# Patient Record
Sex: Male | Born: 1955 | Race: White | Hispanic: No | Marital: Married | Smoking: Former smoker
Health system: Southern US, Community
[De-identification: ages and names within clinical notes are randomized; demographics above are authoritative.]

## PROBLEM LIST (undated history)

## (undated) DIAGNOSIS — Z720 Tobacco use: Secondary | ICD-10-CM

## (undated) DIAGNOSIS — M1990 Unspecified osteoarthritis, unspecified site: Secondary | ICD-10-CM

## (undated) DIAGNOSIS — R42 Dizziness and giddiness: Secondary | ICD-10-CM

## (undated) DIAGNOSIS — I739 Peripheral vascular disease, unspecified: Secondary | ICD-10-CM

## (undated) DIAGNOSIS — E785 Hyperlipidemia, unspecified: Secondary | ICD-10-CM

## (undated) DIAGNOSIS — A6920 Lyme disease, unspecified: Secondary | ICD-10-CM

## (undated) DIAGNOSIS — I10 Essential (primary) hypertension: Secondary | ICD-10-CM

## (undated) DIAGNOSIS — I701 Atherosclerosis of renal artery: Secondary | ICD-10-CM

## (undated) DIAGNOSIS — Z8739 Personal history of other diseases of the musculoskeletal system and connective tissue: Secondary | ICD-10-CM

## (undated) DIAGNOSIS — I779 Disorder of arteries and arterioles, unspecified: Secondary | ICD-10-CM

## (undated) HISTORY — DX: Hyperlipidemia, unspecified: E78.5

## (undated) HISTORY — DX: Tobacco use: Z72.0

## (undated) HISTORY — DX: Peripheral vascular disease, unspecified: I73.9

## (undated) HISTORY — DX: Atherosclerosis of renal artery: I70.1

## (undated) HISTORY — DX: Disorder of arteries and arterioles, unspecified: I77.9

## (undated) HISTORY — DX: Dizziness and giddiness: R42

---

## 1978-10-23 HISTORY — PX: KNEE ARTHROSCOPY: SUR90

## 1988-10-22 HISTORY — PX: KNEE ARTHROSCOPY: SHX127

## 2001-05-07 ENCOUNTER — Ambulatory Visit (HOSPITAL_COMMUNITY): Admission: RE | Admit: 2001-05-07 | Discharge: 2001-05-07 | Payer: Self-pay | Admitting: Internal Medicine

## 2001-05-07 ENCOUNTER — Encounter: Payer: Self-pay | Admitting: Internal Medicine

## 2001-08-08 ENCOUNTER — Encounter: Payer: Self-pay | Admitting: Family Medicine

## 2001-08-08 ENCOUNTER — Ambulatory Visit (HOSPITAL_COMMUNITY): Admission: RE | Admit: 2001-08-08 | Discharge: 2001-08-08 | Payer: Self-pay | Admitting: Family Medicine

## 2001-08-14 ENCOUNTER — Ambulatory Visit (HOSPITAL_COMMUNITY): Admission: RE | Admit: 2001-08-14 | Discharge: 2001-08-14 | Payer: Self-pay | Admitting: Family Medicine

## 2001-08-14 ENCOUNTER — Encounter: Payer: Self-pay | Admitting: Family Medicine

## 2002-01-03 ENCOUNTER — Encounter: Payer: Self-pay | Admitting: Neurology

## 2002-01-03 ENCOUNTER — Ambulatory Visit (HOSPITAL_COMMUNITY): Admission: RE | Admit: 2002-01-03 | Discharge: 2002-01-03 | Payer: Self-pay | Admitting: Neurology

## 2002-01-28 ENCOUNTER — Ambulatory Visit (HOSPITAL_COMMUNITY): Admission: RE | Admit: 2002-01-28 | Discharge: 2002-01-28 | Payer: Self-pay | Admitting: Family Medicine

## 2002-01-28 ENCOUNTER — Encounter: Payer: Self-pay | Admitting: Family Medicine

## 2004-02-22 DIAGNOSIS — A6920 Lyme disease, unspecified: Secondary | ICD-10-CM

## 2004-02-22 HISTORY — DX: Lyme disease, unspecified: A69.20

## 2004-08-05 ENCOUNTER — Emergency Department (HOSPITAL_COMMUNITY): Admission: EM | Admit: 2004-08-05 | Discharge: 2004-08-05 | Payer: Self-pay | Admitting: *Deleted

## 2012-07-26 ENCOUNTER — Ambulatory Visit: Payer: Self-pay | Admitting: Internal Medicine

## 2012-07-26 ENCOUNTER — Other Ambulatory Visit (HOSPITAL_COMMUNITY): Payer: Self-pay | Admitting: Family Medicine

## 2012-07-26 DIAGNOSIS — R0989 Other specified symptoms and signs involving the circulatory and respiratory systems: Secondary | ICD-10-CM

## 2012-07-26 DIAGNOSIS — I739 Peripheral vascular disease, unspecified: Secondary | ICD-10-CM

## 2012-07-27 ENCOUNTER — Ambulatory Visit (HOSPITAL_COMMUNITY)
Admission: RE | Admit: 2012-07-27 | Discharge: 2012-07-27 | Disposition: A | Discharge status: Home / Self Care | Payer: Self-pay | Source: Ambulatory Visit | Attending: Family Medicine | Admitting: Family Medicine

## 2012-07-27 DIAGNOSIS — R0989 Other specified symptoms and signs involving the circulatory and respiratory systems: Secondary | ICD-10-CM

## 2012-07-27 DIAGNOSIS — I739 Peripheral vascular disease, unspecified: Secondary | ICD-10-CM | POA: Insufficient documentation

## 2012-07-27 DIAGNOSIS — I6529 Occlusion and stenosis of unspecified carotid artery: Secondary | ICD-10-CM | POA: Insufficient documentation

## 2012-08-01 ENCOUNTER — Ambulatory Visit (INDEPENDENT_AMBULATORY_CARE_PROVIDER_SITE_OTHER): Payer: Self-pay | Admitting: Cardiovascular Disease

## 2012-08-01 ENCOUNTER — Encounter: Payer: Self-pay | Admitting: Cardiovascular Disease

## 2012-08-01 ENCOUNTER — Encounter (HOSPITAL_COMMUNITY): Payer: Self-pay | Admitting: Cardiovascular Disease

## 2012-08-01 VITALS — BP 120/80 | HR 60 | Ht 70.0 in | Wt 155.0 lb

## 2012-08-01 DIAGNOSIS — R42 Dizziness and giddiness: Secondary | ICD-10-CM | POA: Insufficient documentation

## 2012-08-01 DIAGNOSIS — I779 Disorder of arteries and arterioles, unspecified: Secondary | ICD-10-CM | POA: Insufficient documentation

## 2012-08-01 DIAGNOSIS — I739 Peripheral vascular disease, unspecified: Secondary | ICD-10-CM

## 2012-08-01 NOTE — Patient Instructions (Addendum)
  We will see you back in follow up AFTER THE TEST  Dr Allyson Sabal has ordered LOWER EXTREMITY DOPPLER IN THE NEXT 1-2 WEEKS AND CAROTID DOPPLERS TO BE DONE EVERY 6 MONTHS

## 2012-08-01 NOTE — Assessment & Plan Note (Signed)
He gives a classic history of right calf claudication. His femoral pulses are full without bruit and he has 2+ pedal pulses. Will get bilateral lower extremity arterial Dopplers in our office for further evaluation.

## 2012-08-01 NOTE — Progress Notes (Signed)
   08/01/2012 George Cooper   07-20-1955  [id number]  Primary Physician Kirk Ruths, MD Primary Cardiologist: Runell Gess MD Roseanne Reno   HPI:  George Cooper is a very pleasant 57 year old thin appearing married Caucasian male father of 2, grandfather of 2 grandchildren works as an Personnel officer. He was referred by George Cooper , at [facility], in [place] for evaluation of dizziness and carotid artery disease. The symptoms began several weeks ago. They're more positional. [facility] which revealed mild to moderate bilateral internal carotid artery stenosis. He is otherwise neurologically asymptomatic. He denies chest pain or shortness of breath. He does complain however of right calf claudication. His crit was profile is positive for a 20-pack-year history of tobacco abuse currently smoking one half pack per day. He is not diabetic, hypertensive or hyperlipidemic. His mother did have bypass surgery in her 97s and his sister has had coronary stents as well..   Current Outpatient Prescriptions  Medication Sig Dispense Refill  . aspirin 81 MG tablet Take 81 mg by mouth daily.      . meclizine (ANTIVERT) 25 MG tablet Take 25 mg by mouth 2 (two) times daily.       No current facility-administered medications for this visit.    No Known Allergies  History   Social History  . Marital Status: Married    Spouse Name: N/A    Number of Children: N/A  . Years of Education: N/A   Occupational History  . Not on file.   Social History Main Topics  . Smoking status: Current Every Day Smoker -- 0.50 packs/day    Types: Cigarettes  . Smokeless tobacco: Not on file  . Alcohol Use: No  . Drug Use: Not on file  . Sexually Active: Not on file   Other Topics Concern  . Not on file   Social History Narrative  . No narrative on file     Review of Systems: General: negative for chills, fever, night sweats or weight  changes.  Cardiovascular: negative for chest pain, dyspnea on exertion, edema, orthopnea, palpitations, paroxysmal nocturnal dyspnea or shortness of breath Dermatological: negative for rash Respiratory: negative for cough or wheezing Urologic: negative for hematuria Abdominal: negative for nausea, vomiting, diarrhea, bright red blood per rectum, melena, or hematemesis Neurologic: negative for visual changes, syncope, or dizziness All other systems reviewed and are otherwise negative except as noted above.    Blood pressure 120/80, pulse 60, height 5\' 10"  (1.778 m), weight 155 lb (70.308 kg).  General appearance: alert Neck: no adenopathy, no JVD, supple, symmetrical, trachea midline, thyroid not enlarged, symmetric, no tenderness/mass/nodules and soft left carotid bruit Lungs: clear to auscultation bilaterally Heart: regular rate and rhythm, S1, S2 normal, no murmur, click, rub or gallop Abdomen: soft, non-tender; bowel sounds normal; no masses,  no organomegaly Extremities: extremities normal, atraumatic, no cyanosis or edema Pulses: 2+ and symmetric  EKG sinus rhythm at 60 with inferior Q waves extending up the lateral leads  ASSESSMENT AND PLAN:   Claudication He gives a classic history of right calf claudication. His femoral pulses are full without bruit and he has 2+ pedal pulses. Will get bilateral lower extremity arterial Dopplers in our office for further evaluation.      Runell Gess MD FACP,FACC,FAHA, [facility] 08/01/2012 10:56 AM

## 2012-08-07 ENCOUNTER — Ambulatory Visit: Payer: Self-pay | Admitting: Internal Medicine

## 2012-08-16 ENCOUNTER — Encounter (HOSPITAL_COMMUNITY): Payer: Self-pay

## 2012-08-16 ENCOUNTER — Ambulatory Visit (HOSPITAL_COMMUNITY)
Admission: RE | Admit: 2012-08-16 | Discharge: 2012-08-16 | Disposition: A | Discharge status: Home / Self Care | Payer: Self-pay | Source: Ambulatory Visit | Attending: Internal Medicine | Admitting: Internal Medicine

## 2012-08-16 DIAGNOSIS — I70219 Atherosclerosis of native arteries of extremities with intermittent claudication, unspecified extremity: Secondary | ICD-10-CM | POA: Insufficient documentation

## 2012-08-16 DIAGNOSIS — I739 Peripheral vascular disease, unspecified: Secondary | ICD-10-CM

## 2012-08-16 NOTE — Progress Notes (Signed)
Lower Extremity Arterial Duplex Completed. °George Cooper ° °

## 2012-09-05 ENCOUNTER — Encounter: Payer: Self-pay | Admitting: Cardiovascular Disease

## 2012-09-05 ENCOUNTER — Ambulatory Visit (INDEPENDENT_AMBULATORY_CARE_PROVIDER_SITE_OTHER): Payer: Self-pay | Admitting: Cardiovascular Disease

## 2012-09-05 ENCOUNTER — Encounter (HOSPITAL_COMMUNITY): Payer: Self-pay | Admitting: Pharmacy Technician

## 2012-09-05 VITALS — BP 142/74 | HR 56 | Ht 69.0 in | Wt 154.0 lb

## 2012-09-05 DIAGNOSIS — F17200 Nicotine dependence, unspecified, uncomplicated: Secondary | ICD-10-CM

## 2012-09-05 DIAGNOSIS — D689 Coagulation defect, unspecified: Secondary | ICD-10-CM

## 2012-09-05 DIAGNOSIS — I739 Peripheral vascular disease, unspecified: Secondary | ICD-10-CM

## 2012-09-05 DIAGNOSIS — Z720 Tobacco use: Secondary | ICD-10-CM

## 2012-09-05 DIAGNOSIS — Z01818 Encounter for other preprocedural examination: Secondary | ICD-10-CM

## 2012-09-05 DIAGNOSIS — R5383 Other fatigue: Secondary | ICD-10-CM

## 2012-09-05 DIAGNOSIS — R5381 Other malaise: Secondary | ICD-10-CM

## 2012-09-05 NOTE — Progress Notes (Signed)
Mr. Reinig returns for followup. His Dopplers are abnormal suggesting a right SFA stenosis. He is admitted for angiography and potential endovascular therapy for lifestyle limiting claudication.  Runell Gess, M.D., [facility] [address] [place], [place]  [address]  [phone] 09/05/2012 11:58 AM

## 2012-09-05 NOTE — Patient Instructions (Addendum)
Dr. Allyson Sabal has ordered a peripheral angiogram to be done at [facility].  This procedure is going to look at the bloodflow in your lower extremities.  If Dr. Allyson Sabal is able to open up the arteries, you will have to spend one night in the hospital.  If he is not able to open the arteries, you will be able to go home that same day.    After the procedure, you will not be allowed to drive for 3 days or push, pull, or lift anything greater than 10 lbs for one week.    You will be required to have bloodwork (fasting) and a chest xray prior to your procedure.  Our scheduler will advise you on when these items need to be done.     REPS: Scott   Dr Allyson Sabal also recommends that you have a stress test prior to the angiogram to look at the bloodflow to your heart muscle

## 2012-09-05 NOTE — Assessment & Plan Note (Signed)
Lower extremity arterial Dopplers performed 08/15/12 revealed a right ABI of 0.79 the high-frequency signal the origin of his right SFA and a left ABI of 0.8 with moderate left iliac disease.I recommended that he undergo abdominal aortography with bifemoral runoff and potential endovascular therapy of his right SFA stenosis for lifestyle limiting claudication.

## 2012-09-13 ENCOUNTER — Ambulatory Visit
Admission: RE | Admit: 2012-09-13 | Discharge: 2012-09-13 | Disposition: A | Discharge status: Home / Self Care | Payer: No Typology Code available for payment source | Source: Ambulatory Visit | Attending: Cardiovascular Disease | Admitting: Cardiovascular Disease

## 2012-09-13 DIAGNOSIS — Z720 Tobacco use: Secondary | ICD-10-CM

## 2012-09-14 ENCOUNTER — Encounter: Payer: Self-pay | Admitting: Internal Medicine

## 2012-09-14 ENCOUNTER — Ambulatory Visit (HOSPITAL_COMMUNITY)
Admission: RE | Admit: 2012-09-14 | Discharge: 2012-09-14 | Disposition: A | Discharge status: Home / Self Care | Payer: Self-pay | Source: Ambulatory Visit | Attending: Cardiovascular Disease | Admitting: Cardiovascular Disease

## 2012-09-14 DIAGNOSIS — Z01818 Encounter for other preprocedural examination: Secondary | ICD-10-CM

## 2012-09-14 DIAGNOSIS — Z8249 Family history of ischemic heart disease and other diseases of the circulatory system: Secondary | ICD-10-CM | POA: Insufficient documentation

## 2012-09-14 DIAGNOSIS — R5383 Other fatigue: Secondary | ICD-10-CM | POA: Insufficient documentation

## 2012-09-14 DIAGNOSIS — Z01810 Encounter for preprocedural cardiovascular examination: Secondary | ICD-10-CM

## 2012-09-14 DIAGNOSIS — F17200 Nicotine dependence, unspecified, uncomplicated: Secondary | ICD-10-CM | POA: Insufficient documentation

## 2012-09-14 DIAGNOSIS — R5381 Other malaise: Secondary | ICD-10-CM | POA: Insufficient documentation

## 2012-09-14 DIAGNOSIS — R42 Dizziness and giddiness: Secondary | ICD-10-CM | POA: Insufficient documentation

## 2012-09-14 DIAGNOSIS — I739 Peripheral vascular disease, unspecified: Secondary | ICD-10-CM

## 2012-09-14 DIAGNOSIS — I6529 Occlusion and stenosis of unspecified carotid artery: Secondary | ICD-10-CM | POA: Insufficient documentation

## 2012-09-14 LAB — COMPREHENSIVE METABOLIC PANEL
ALT: 13 U/L (ref 0–53)
Albumin: 4 g/dL (ref 3.5–5.2)
BUN: 18 mg/dL (ref 6–23)
CO2: 23 mEq/L (ref 19–32)
Calcium: 9.1 mg/dL (ref 8.4–10.5)
Chloride: 106 mEq/L (ref 96–112)
Creat: 1.04 mg/dL (ref 0.50–1.35)

## 2012-09-14 LAB — CBC
HCT: 43.2 % (ref 39.0–52.0)
MCV: 84.5 fL (ref 78.0–100.0)
RBC: 5.11 MIL/uL (ref 4.22–5.81)
WBC: 9.8 10*3/uL (ref 4.0–10.5)

## 2012-09-14 LAB — PROTIME-INR: INR: 0.96 (ref <1.50)

## 2012-09-14 LAB — LIPID PANEL: HDL: 29 mg/dL — ABNORMAL LOW (ref >39)

## 2012-09-14 MED ORDER — TECHNETIUM TC 99M SESTAMIBI GENERIC - CARDIOLITE
31.0000 | Freq: Once | INTRAVENOUS | Status: AC | PRN
  Administered 2012-09-14: 31 via INTRAVENOUS

## 2012-09-14 MED ORDER — REGADENOSON 0.4 MG/5ML IV SOLN
0.4000 mg | Freq: Once | INTRAVENOUS | Status: AC
  Administered 2012-09-14: 0.4 mg via INTRAVENOUS

## 2012-09-14 MED ORDER — TECHNETIUM TC 99M SESTAMIBI GENERIC - CARDIOLITE
11.0000 | Freq: Once | INTRAVENOUS | Status: AC | PRN
  Administered 2012-09-14: 11 via INTRAVENOUS

## 2012-09-14 NOTE — Procedures (Addendum)
[facility] [address] [place] [address] [place] [address] [phone]  Cardiology Nuclear Med Study  George Cooper is a 57 y.o. male     MRN : [id number]     DOB: 1955-12-17  Procedure Date: 09/14/2012  Nuclear Med Background Indication for Stress Test:  Surgical Clearance History:  NO PRIOR HISTORY REPORTED Cardiac Risk Factors: Carotid Disease, Claudication, Family History - CAD, PVD and Smoker  Symptoms:  Dizziness, Fatigue and Light-Headedness   Nuclear Pre-Procedure Caffeine/Decaff Intake:  7:00pm NPO After: 5:00am   IV Site: R Hand  IV 0.9% NS with Angio Cath:  22g  Chest Size (in):  42"  IV Started by: Emmit Pomfret, RN  Height: 5\' 9"  (1.753 m)  Cup Size: n/a  BMI:  Body mass index is 22.73 kg/(m^2). Weight:  154 lb (69.854 kg)   Tech Comments:  N/A    Nuclear Med Study 1 or 2 day study: 1 day  Stress Test Type:  Lexiscan  Order Authorizing Provider:  Nanetta Batty, MD   Resting Radionuclide: Technetium 20m Sestamibi  Resting Radionuclide Dose: 11.0 mCi   Stress Radionuclide:  Technetium 58m Sestamibi  Stress Radionuclide Dose: 31.0 mCi           Stress Protocol Rest HR: 53 Stress HR: 96  Rest BP: 142/76 Stress BP: 133/62  Exercise Time (min): n/a METS: n/a   Predicted Max HR: 162 bpm % Max HR: 59.26 bpm Rate Pressure Product: 21308  Dose of Adenosine (mg):  n/a Dose of Lexiscan: 0.4 mg  Dose of Atropine (mg): n/a Dose of Dobutamine: n/a mcg/kg/min (at max HR)  Stress Test Technologist: Esperanza Sheets, CCT Nuclear Technologist: Gonzella Lex, CNMT   Rest Procedure:  Myocardial perfusion imaging was performed at rest 45 minutes following the intravenous administration of Technetium 40m Sestamibi. Stress Procedure:  The patient received IV Lexiscan 0.4 mg over 15-seconds.  Technetium 37m Sestamibi injected at 30-seconds.  There were no significant changes with Lexiscan.  Quantitative spect images were obtained  after a 45 minute delay.  Transient Ischemic Dilatation (Normal <1.22):  1.0 Lung/Heart Ratio (Normal <0.45):  0.29 QGS EDV:  91 ml QGS ESV:  36 ml LV Ejection Fraction: 60%     Rest ECG: Sinus bradycardia. Nonspecific inferolateral Q waves  Stress ECG: No significant change from baseline ECG  QPS Raw Data Images:  Normal; no motion artifact; normal heart/lung ratio. Stress Images:  Moderate reduction in inferoseptal tracer uptake. Rest Images:  Comparison with the stress images reveals no significant change. Subtraction (SDS):  There is a fixed defect that is most consistent with a previous infarction.  Impression Exercise Capacity:  Lexiscan with no exercise. BP Response:  Normal blood pressure response. Clinical Symptoms:  No significant symptoms noted. ECG Impression:  No significant ST segment change suggestive of ischemia. Comparison with Prior Nuclear Study: No previous nuclear study performed  Overall Impression:  Low risk stress nuclear study with evidence of an inferoseptal scar. There is no evidence of reversible ischemia.  LV Wall Motion:  Normal overall LV function. Regional wall motion is difficult to evaluate due to technical/processing limitations.   [name], MD  09/14/2012 2:37 PM

## 2012-09-17 ENCOUNTER — Telehealth: Payer: Self-pay | Admitting: *Deleted

## 2012-09-17 DIAGNOSIS — R9389 Abnormal findings on diagnostic imaging of other specified body structures: Secondary | ICD-10-CM

## 2012-09-17 NOTE — Telephone Encounter (Signed)
Message copied by Marella Bile on Mon Sep 17, 2012  5:13 PM ------      Message from: Runell Gess      Created: Thu Sep 13, 2012  1:38 PM       Repeat chest x-ray with nipple markers ------

## 2012-09-18 ENCOUNTER — Ambulatory Visit (HOSPITAL_COMMUNITY)
Admission: RE | Admit: 2012-09-18 | Discharge: 2012-09-19 | Disposition: A | Discharge status: Home / Self Care | Payer: MEDICAID | Source: Ambulatory Visit | Attending: Cardiovascular Disease | Admitting: Cardiovascular Disease

## 2012-09-18 ENCOUNTER — Encounter (HOSPITAL_COMMUNITY)
Admission: RE | Disposition: A | Discharge status: Home / Self Care | Payer: Self-pay | Source: Ambulatory Visit | Attending: Cardiovascular Disease

## 2012-09-18 ENCOUNTER — Encounter (HOSPITAL_COMMUNITY): Payer: Self-pay | Admitting: General Practice

## 2012-09-18 ENCOUNTER — Ambulatory Visit (HOSPITAL_COMMUNITY): Payer: Self-pay

## 2012-09-18 DIAGNOSIS — Z7902 Long term (current) use of antithrombotics/antiplatelets: Secondary | ICD-10-CM | POA: Insufficient documentation

## 2012-09-18 DIAGNOSIS — I6529 Occlusion and stenosis of unspecified carotid artery: Secondary | ICD-10-CM | POA: Insufficient documentation

## 2012-09-18 DIAGNOSIS — I658 Occlusion and stenosis of other precerebral arteries: Secondary | ICD-10-CM | POA: Insufficient documentation

## 2012-09-18 DIAGNOSIS — Z79899 Other long term (current) drug therapy: Secondary | ICD-10-CM | POA: Insufficient documentation

## 2012-09-18 DIAGNOSIS — F17200 Nicotine dependence, unspecified, uncomplicated: Secondary | ICD-10-CM | POA: Insufficient documentation

## 2012-09-18 DIAGNOSIS — I701 Atherosclerosis of renal artery: Secondary | ICD-10-CM | POA: Insufficient documentation

## 2012-09-18 DIAGNOSIS — I739 Peripheral vascular disease, unspecified: Secondary | ICD-10-CM | POA: Diagnosis present

## 2012-09-18 DIAGNOSIS — Z01818 Encounter for other preprocedural examination: Secondary | ICD-10-CM

## 2012-09-18 DIAGNOSIS — I70219 Atherosclerosis of native arteries of extremities with intermittent claudication, unspecified extremity: Secondary | ICD-10-CM

## 2012-09-18 DIAGNOSIS — I708 Atherosclerosis of other arteries: Secondary | ICD-10-CM | POA: Insufficient documentation

## 2012-09-18 DIAGNOSIS — I779 Disorder of arteries and arterioles, unspecified: Secondary | ICD-10-CM

## 2012-09-18 HISTORY — PX: ATHERECTOMY: SHX5502

## 2012-09-18 HISTORY — DX: Lyme disease, unspecified: A69.20

## 2012-09-18 HISTORY — PX: LOWER EXTREMITY ANGIOGRAM: SHX5508

## 2012-09-18 HISTORY — PX: PERIPHERAL ARTERIAL STENT GRAFT: SHX2220

## 2012-09-18 HISTORY — DX: Unspecified osteoarthritis, unspecified site: M19.90

## 2012-09-18 HISTORY — PX: PERCUTANEOUS STENT INTERVENTION: SHX5500

## 2012-09-18 HISTORY — DX: Personal history of other diseases of the musculoskeletal system and connective tissue: Z87.39

## 2012-09-18 HISTORY — PX: ABDOMINAL ANGIOGRAM: SHX5499

## 2012-09-18 LAB — POCT ACTIVATED CLOTTING TIME: Activated Clotting Time: 201 seconds

## 2012-09-18 SURGERY — ANGIOGRAM, LOWER EXTREMITY
Anesthesia: LOCAL | Laterality: Right

## 2012-09-18 MED ORDER — SODIUM CHLORIDE 0.9 % IV SOLN
INTRAVENOUS | Status: AC
  Administered 2012-09-18: 13:00:00 via INTRAVENOUS

## 2012-09-18 MED ORDER — FENTANYL CITRATE 0.05 MG/ML IJ SOLN
INTRAMUSCULAR | Status: AC
  Filled 2012-09-18: qty 2

## 2012-09-18 MED ORDER — CLOPIDOGREL BISULFATE 300 MG PO TABS
ORAL_TABLET | ORAL | Status: AC
  Filled 2012-09-18: qty 1

## 2012-09-18 MED ORDER — SODIUM CHLORIDE 0.9 % IV SOLN
INTRAVENOUS | Status: DC
  Administered 2012-09-18: 08:00:00 via INTRAVENOUS

## 2012-09-18 MED ORDER — ASPIRIN EC 325 MG PO TBEC
325.0000 mg | DELAYED_RELEASE_TABLET | Freq: Every day | ORAL | Status: DC
  Administered 2012-09-19: 325 mg via ORAL
  Filled 2012-09-18: qty 1

## 2012-09-18 MED ORDER — HEPARIN (PORCINE) IN NACL 2-0.9 UNIT/ML-% IJ SOLN
INTRAMUSCULAR | Status: AC
  Filled 2012-09-18: qty 500

## 2012-09-18 MED ORDER — ACETAMINOPHEN 325 MG PO TABS: 650.0000 mg | ORAL_TABLET | ORAL | Status: DC | PRN

## 2012-09-18 MED ORDER — LIDOCAINE HCL (PF) 1 % IJ SOLN
INTRAMUSCULAR | Status: AC
  Filled 2012-09-18: qty 30

## 2012-09-18 MED ORDER — IBUPROFEN 800 MG PO TABS
800.0000 mg | ORAL_TABLET | ORAL | Status: DC | PRN
  Administered 2012-09-18: 800 mg via ORAL
  Filled 2012-09-18: qty 1

## 2012-09-18 MED ORDER — ONDANSETRON HCL 4 MG/2ML IJ SOLN: 4.0000 mg | Freq: Four times a day (QID) | INTRAMUSCULAR | Status: DC | PRN

## 2012-09-18 MED ORDER — ASPIRIN 81 MG PO CHEW
324.0000 mg | CHEWABLE_TABLET | ORAL | Status: AC
  Administered 2012-09-18: 324 mg via ORAL
  Filled 2012-09-18: qty 4

## 2012-09-18 MED ORDER — ASPIRIN EC 81 MG PO TBEC: 81.0000 mg | DELAYED_RELEASE_TABLET | Freq: Every day | ORAL | Status: DC

## 2012-09-18 MED ORDER — MECLIZINE HCL 25 MG PO TABS
25.0000 mg | ORAL_TABLET | Freq: Two times a day (BID) | ORAL | Status: DC | PRN
  Filled 2012-09-18: qty 1

## 2012-09-18 MED ORDER — HEPARIN (PORCINE) IN NACL 2-0.9 UNIT/ML-% IJ SOLN
INTRAMUSCULAR | Status: AC
  Filled 2012-09-18: qty 1000

## 2012-09-18 MED ORDER — MIDAZOLAM HCL 2 MG/2ML IJ SOLN
INTRAMUSCULAR | Status: AC
  Filled 2012-09-18: qty 2

## 2012-09-18 MED ORDER — MORPHINE SULFATE 2 MG/ML IJ SOLN
2.0000 mg | Freq: Once | INTRAMUSCULAR | Status: AC
  Administered 2012-09-18: 14:00:00 4 mg via INTRAVENOUS
  Filled 2012-09-18: qty 2

## 2012-09-18 MED ORDER — SODIUM CHLORIDE 0.9 % IJ SOLN: 3.0000 mL | INTRAMUSCULAR | Status: DC | PRN

## 2012-09-18 MED ORDER — CLOPIDOGREL BISULFATE 75 MG PO TABS
75.0000 mg | ORAL_TABLET | Freq: Every day | ORAL | Status: DC
  Administered 2012-09-19: 75 mg via ORAL
  Filled 2012-09-18: qty 1

## 2012-09-18 MED ORDER — DIAZEPAM 5 MG PO TABS
5.0000 mg | ORAL_TABLET | ORAL | Status: AC
  Administered 2012-09-18: 5 mg via ORAL
  Filled 2012-09-18: qty 1

## 2012-09-18 NOTE — Progress Notes (Signed)
Site area: left groin  Site Prior to Removal:  Level 0  Pressure Applied For 30    MINUTES    Minutes Beginning at 1415  Manual:   yes  Patient Status During Pull:  stable  Post Pull Groin Site:  Level 0  Post Pull Instructions Given:  yes  Post Pull Pulses Present:  yes  Dressing Applied:  yes  Comments:  Held extra 10 minutes secondary to ooze at site, which was present upon arrival to unit.

## 2012-09-18 NOTE — CV Procedure (Signed)
George Cooper is a 57 y.o. male    [id number] LOCATION:  FACILITY: [facility]  PHYSICIAN: Nanetta Batty, M.D. 11/02/1955   DATE OF PROCEDURE:  09/18/2012  DATE OF DISCHARGE:   CARDIAC CATHETERIZATION     History obtained from chart review.George Cooper is a very pleasant 57 year old thin appearing married Caucasian male father of 2, grandfather of 2 grandchildren works as an Personnel officer. He was referred by Dr. Yetta Numbers , at [facility], in [place] for evaluation of dizziness and carotid artery disease. The symptoms began several weeks ago. They're more positional. He [facility] which revealed mild to moderate bilateral internal carotid artery stenosis. He is otherwise neurologically asymptomatic. He denies chest pain or shortness of breath. He does complain however of right calf claudication. His crit was profile is positive for a 20-pack-year history of tobacco abuse currently smoking one half pack per day. He is not diabetic, hypertensive or hyperlipidemic. His mother did have bypass surgery in her 65s and his sister has had coronary stents as well. Stress test which was normal. Lower extremity arterial Dopplers revealed a high-frequency signal in the proximal segment of his right SFA. He presents today for angiography and potential percutaneous intervention for lifestyle limiting claudication    PROCEDURE DESCRIPTION:    The patient was brought to the second floor [facility] cath lab in the postabsorptive state. He was premedicated with Valium 5 mg by mouth, IV Versed and fentanyl. His left groinwas prepped and shaved in usual sterile fashion. Xylocaine 1% was used for local anesthesia. A 5 French sheath was inserted into the left common femoral artery using standard Seldinger technique. A 5 French pigtail catheter was used for midstream abdominal aortography, bilateral iliac angiography, and bifemoral runoff using bolus chase digital  subtraction step table technique. Visipaque dye was used for the entirety of the case. Retrograde aortic pressure was monitored during the case.   HEMODYNAMICS:    AO SYSTOLIC/AO DIASTOLIC: 145/68  ANGIOGRAPHIC RESULTS:   1: Abdominal aortogram-80% proximal right renal artery stenosis, 40% left  2: Left lower extremity-40-50% ostial left common iliac artery stenosis with three-vessel runoff  3: Right lower extremity-tandem 80% stenoses in the proximal third of the right SFA with three-vessel runoff  IMPRESSION:George Cooper has tandem high-grade proximal right SFA stenoses responsible for his claudication. We will proceed with provocative directional atherectomy, plus/minus PTA and stenting.  Procedure description:contralateral access obtained with a 5 [place] crossover catheter and a 7 French/55 cm Ansel sheath. The patient received 7000 units of heparin intravenously and an ACT of 237. Total contrast the minister to the patient with 258 cc. The lesion was crossed with an 014/300 cm long [place] core wire. A 6 mm spider distal protection device was then advanced into the above-knee popliteal segment. Using LXC turbo hawked calcium cutter, multiple atherectomy runs were performed of the diseased segment. A large amount of plaque was removed. There did appear to be a distal dissection and angioplasty was then performed with a 5 mm x 120 mm long chocolate balloon for 2 minutes at 2 atmospheres. There remained a small linear area of dissection in the midportion as well as a hypodense area in the mid midportion as well and therefore stenting was performed with a 6 mm x 120 mm long S.M.A.R.T. Flex Nitinol self expanding stent. The final angiographic result with reduction of tandem 80% stenoses to 0% residual with excellent flow and three-vessel runoff. The distal protection device was then captured  and withdrawn from the body. Completion angiography was performed revealing three-vessel runoff with an excellent  angiographic result. The sheath was then withdrawn across the bifurcation and exchanged over a wire for a short 7 [place] sheath. The patient received 300 mg of by mouth Plavix.  Final impression: Successful turbo Hawk directional atherectomy, PTA and stenting of high-grade tandem proximal right SFA stenoses for lifestyle limiting claudication. The patient will be treated with aspirin and Plavix. He will be hydrated overnight and discharged home in the morning. He will get followup Dopplers after which I will see him back in the office.  Runell Gess MD, [facility] 09/18/2012 10:54 AM

## 2012-09-18 NOTE — H&P (Signed)
    Pt was reexamined and existing H & P reviewed. No changes found.  Runell Gess, MD [facility] 09/18/2012 9:21 AM

## 2012-09-19 ENCOUNTER — Other Ambulatory Visit: Payer: Self-pay | Admitting: Physician Assistant

## 2012-09-19 DIAGNOSIS — I739 Peripheral vascular disease, unspecified: Secondary | ICD-10-CM

## 2012-09-19 LAB — CBC
Hemoglobin: 13.8 g/dL (ref 13.0–17.0)
MCHC: 34.1 g/dL (ref 30.0–36.0)
Platelets: 186 10*3/uL (ref 150–400)
RBC: 4.72 MIL/uL (ref 4.22–5.81)

## 2012-09-19 LAB — BASIC METABOLIC PANEL
Calcium: 9.2 mg/dL (ref 8.4–10.5)
GFR calc Af Amer: 87 mL/min — ABNORMAL LOW (ref >90)
GFR calc non Af Amer: 75 mL/min — ABNORMAL LOW (ref >90)
Potassium: 3.9 mEq/L (ref 3.5–5.1)
Sodium: 136 mEq/L (ref 135–145)

## 2012-09-19 LAB — POCT ACTIVATED CLOTTING TIME
Activated Clotting Time: 232 seconds
Activated Clotting Time: 237 seconds

## 2012-09-19 MED ORDER — CLOPIDOGREL BISULFATE 75 MG PO TABS: 75.0000 mg | ORAL_TABLET | Freq: Every day | ORAL | Status: DC

## 2012-09-19 MED ORDER — ASPIRIN 325 MG PO TBEC: 325.0000 mg | DELAYED_RELEASE_TABLET | Freq: Every day | ORAL | Status: DC

## 2012-09-19 NOTE — Progress Notes (Signed)
Subjective: No complaints  Objective: Vital signs in last 24 hours: Temp:  [97.5 F (36.4 C)-98.7 F (37.1 C)] 98 F (36.7 C) (07/30 0800) Pulse Rate:  [54-80] 60 (07/30 0800) Resp:  [15-20] 18 (07/30 0800) BP: (126-170)/(49-106) 136/68 mmHg (07/30 0800) SpO2:  [96 %-99 %] 98 % (07/30 0800) Weight:  [154 lb 15.7 oz (70.3 kg)] 154 lb 15.7 oz (70.3 kg) (07/30 0052) Last BM Date: 09/18/12  Intake/Output from previous day: 07/29 0701 - 07/30 0700 In: 1303.8 [P.O.:720; I.V.:583.8] Out: 500 [Urine:500] Intake/Output this shift:    Medications Current Facility-Administered Medications  Medication Dose Route Frequency Provider Last Rate Last Dose  . acetaminophen (TYLENOL) tablet 650 mg  650 mg Oral Q4H PRN Runell Gess, MD      . aspirin EC tablet 325 mg  325 mg Oral Daily Runell Gess, MD   325 mg at 09/19/12 1478  . clopidogrel (PLAVIX) tablet 75 mg  75 mg Oral Q breakfast Runell Gess, MD   75 mg at 09/19/12 2956  . ibuprofen (ADVIL,MOTRIN) tablet 800 mg  800 mg Oral Q4H PRN Lennette Bihari, MD   800 mg at 09/18/12 2130  . meclizine (ANTIVERT) tablet 25 mg  25 mg Oral BID PRN Runell Gess, MD      . ondansetron [facility]) injection 4 mg  4 mg Intravenous Q6H PRN Runell Gess, MD        PE: General appearance: alert, cooperative and no distress Lungs: clear to auscultation bilaterally Heart: regular rate and rhythm, S1, S2 normal, no murmur, click, rub or gallop Extremities: No LEE Pulses: 2+ and symmetric 2+ left PT, 2+ right PT and 1+ right DP Skin: Warm and dry Neurologic: Grossly normal  Lab Results:   Recent Labs  09/19/12 0531  WBC 9.3  HGB 13.8  HCT 40.5  PLT 186   BMET  Recent Labs  09/19/12 0531  NA 136  K 3.9  CL 102  CO2 24  GLUCOSE 101*  BUN 14  CREATININE 1.07  CALCIUM 9.2   Studies/Results: HEMODYNAMICS:  AO SYSTOLIC/AO DIASTOLIC: 145/68  ANGIOGRAPHIC RESULTS:  1: Abdominal aortogram-80% proximal right renal  artery stenosis, 40% left  2: Left lower extremity-40-50% ostial left common iliac artery stenosis with three-vessel runoff  3: Right lower extremity-tandem 80% stenoses in the proximal third of the right SFA with three-vessel runoff  IMPRESSION:[name] has tandem high-grade proximal right SFA stenoses responsible for his claudication. We will proceed with provocative directional atherectomy, plus/minus PTA and stenting.  Procedure description:contralateral access obtained with a 5 [place] crossover catheter and a 7 French/55 cm Ansel sheath. The patient received 7000 units of heparin intravenously and an ACT of 237. Total contrast the minister to the patient with 258 cc. The lesion was crossed with an 014/300 cm long [place] core wire. A 6 mm spider distal protection device was then advanced into the above-knee popliteal segment. Using LXC turbo hawked calcium cutter, multiple atherectomy runs were performed of the diseased segment. A large amount of plaque was removed. There did appear to be a distal dissection and angioplasty was then performed with a 5 mm x 120 mm long chocolate balloon for 2 minutes at 2 atmospheres. There remained a small linear area of dissection in the midportion as well as a hypodense area in the mid midportion as well and therefore stenting was performed with a 6 mm x 120 mm long S.M.A.R.T. Flex Nitinol self expanding stent. The final  angiographic result with reduction of tandem 80% stenoses to 0% residual with excellent flow and three-vessel runoff. The distal protection device was then captured and withdrawn from the body. Completion angiography was performed revealing three-vessel runoff with an excellent angiographic result. The sheath was then withdrawn across the bifurcation and exchanged over a wire for a short 7 [place] sheath. The patient received 300 mg of by mouth Plavix.  Final impression: Successful turbo Hawk directional atherectomy, PTA and stenting of high-grade tandem  proximal right SFA stenoses for lifestyle limiting claudication. The patient will be treated with aspirin and Plavix. He will be hydrated overnight and discharged home in the morning. He will get followup Dopplers after which I will see him back in the office.  Runell Gess MD, [facility]  09/18/2012  Assessment/Plan  Active Problems:   Claudication   PAD (peripheral artery disease)  Plan:  SP  Successful turbo Hawk directional atherectomy, PTA and stenting of high-grade tandem proximal right SFA stenoses for lifestyle limiting claudication.  OP LEA dopplers then return to see Dr. Allyson Sabal.  Nipple shadow confirm on repete CXR.  BP, HR and labs stable.  DC how today.   LOS: 1 day    George Cooper 09/19/2012 9:55 AM

## 2012-09-19 NOTE — Progress Notes (Signed)
Pt. Seen and examined. Agree with the NP/PA-C note as written.  Successful turbo Hawk atherectomy yesterday to the right SFA. He reports improvement in leg pain with walking today. Ok for discharge home. Groin is without hematoma or bruit. Follow-up with Dr. Allyson Sabal.  Chrystie Nose, MD, [facility] Attending Cardiologist [facility]

## 2012-09-19 NOTE — Progress Notes (Signed)
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  Pt is requesting ibuprofen.  Dr. Tresa Endo notified and order received.

## 2012-09-19 NOTE — Discharge Summary (Signed)
Physician Discharge Summary  Patient ID: George Cooper MRN: [id number] DOB/AGE: [birth year] 57 y.o.  Admit date: 09/18/2012 Discharge date: 09/19/2012  Admission Diagnoses:  Claudication  Discharge Diagnoses:  Active Problems:   Claudication   PAD (peripheral artery disease)   Discharged Condition: stable  Hospital Course:   [name] is a very pleasant 57 year old thin appearing married Caucasian male father of 2, grandfather of 2 grandchildren works as an Personnel officer. He was referred by Dr. Yetta Numbers , at [facility], in [place] for evaluation of dizziness and carotid artery disease. The symptoms began several weeks ago. They're more positional. He [facility] which revealed mild to moderate bilateral internal carotid artery stenosis. He is otherwise neurologically asymptomatic. He denies chest pain or shortness of breath. He does complain however of right calf claudication. His crit was profile is positive for a 20-pack-year history of tobacco abuse currently smoking one half pack per day. He is not diabetic, hypertensive or hyperlipidemic. His mother did have bypass surgery in her 43s and his sister has had coronary stents as well.  Lower extremity arterial Dopplers revealed a high-frequency signal in the proximal segment of his right SFA.   The patient presented for angiography and potential percutaneous intervention for lifestyle limiting claudication.    He under went successful turbo [facility] directional atherectomy, PTA and stenting of high-grade tandem proximal right SFA stenoses for lifestyle limiting claudication.  LEA dopplers will be completed in 2-3 weeks with FU appt thereafter with Dr. Allyson Sabal.  The patient was seen by Dr. Rennis Golden who felt he was stable for DC home.   Consults: None  Significant Diagnostic Studies:  HEMODYNAMICS:  AO SYSTOLIC/AO DIASTOLIC: 145/68  ANGIOGRAPHIC RESULTS:  1: Abdominal aortogram-80% proximal right  renal artery stenosis, 40% left  2: Left lower extremity-40-50% ostial left common iliac artery stenosis with three-vessel runoff  3: Right lower extremity-tandem 80% stenoses in the proximal third of the right SFA with three-vessel runoff  IMPRESSION:[name] has tandem high-grade proximal right SFA stenoses responsible for his claudication. We will proceed with provocative directional atherectomy, plus/minus PTA and stenting.  Procedure description:contralateral access obtained with a 5 [place] crossover catheter and a 7 French/55 cm Ansel sheath. The patient received 7000 units of heparin intravenously and an ACT of 237. Total contrast the minister to the patient with 258 cc. The lesion was crossed with an 014/300 cm long [place] core wire. A 6 mm spider distal protection device was then advanced into the above-knee popliteal segment. Using LXC turbo hawked calcium cutter, multiple atherectomy runs were performed of the diseased segment. A large amount of plaque was removed. There did appear to be a distal dissection and angioplasty was then performed with a 5 mm x 120 mm long chocolate balloon for 2 minutes at 2 atmospheres. There remained a small linear area of dissection in the midportion as well as a hypodense area in the mid midportion as well and therefore stenting was performed with a 6 mm x 120 mm long S.M.A.R.T. Flex Nitinol self expanding stent. The final angiographic result with reduction of tandem 80% stenoses to 0% residual with excellent flow and three-vessel runoff. The distal protection device was then captured and withdrawn from the body. Completion angiography was performed revealing three-vessel runoff with an excellent angiographic result. The sheath was then withdrawn across the bifurcation and exchanged over a wire for a short 7 [place] sheath. The patient received 300 mg of by mouth Plavix.  Final impression:  Successful turbo Hawk directional atherectomy, PTA and stenting of high-grade  tandem proximal right SFA stenoses for lifestyle limiting claudication. The patient will be treated with aspirin and Plavix. He will be hydrated overnight and discharged home in the morning. He will get followup Dopplers after which I will see him back in the office.  Runell Gess MD, [facility]  09/18/2012  Treatments: See above  Discharge Exam: Blood pressure 136/68, pulse 60, temperature 98 F (36.7 C), temperature source Oral, resp. rate 18, height 5\' 8"  (1.727 m), weight 154 lb 15.7 oz (70.3 kg), SpO2 98.00%.   Disposition: 01-Home or Self Care  Discharge Orders   Future Appointments Provider Department Dept Phone   10/10/2012 9:30 AM Runell Gess, MD [facility] [place] [phone]   Future Orders Complete By Expires     Diet - low sodium heart healthy  As directed     Discharge instructions  As directed     Comments:      No lifting more than a half gallon of milk or driving for three days.    Increase activity slowly  As directed         Medication List         aspirin 325 MG EC tablet  Take 1 tablet (325 mg total) by mouth daily.     clopidogrel 75 MG tablet  Commonly known as:  PLAVIX  Take 1 tablet (75 mg total) by mouth daily with breakfast.     meclizine 25 MG tablet  Commonly known as:  ANTIVERT  Take 25 mg by mouth See admin instructions. Was taking up to 6 times daily as needed for vertigo (has tapered down and stopped until after the procedure scheduled for 09/18/12)           Follow-up Information   Follow up with Runell Gess, MD On 10/10/2012. (0930 hrs)    Contact information:   [address] [place] [address] [phone]      Discharge Time: <   Signed: Ieshia Hatcher 09/19/2012, 3:05 PM

## 2012-09-21 ENCOUNTER — Telehealth: Payer: Self-pay | Admitting: Cardiovascular Disease

## 2012-09-21 ENCOUNTER — Encounter: Payer: Self-pay | Admitting: Cardiology

## 2012-09-21 ENCOUNTER — Ambulatory Visit (HOSPITAL_COMMUNITY)
Admission: RE | Admit: 2012-09-21 | Discharge: 2012-09-21 | Disposition: A | Discharge status: Home / Self Care | Payer: Self-pay | Source: Ambulatory Visit | Attending: Cardiovascular Disease | Admitting: Cardiovascular Disease

## 2012-09-21 ENCOUNTER — Ambulatory Visit (INDEPENDENT_AMBULATORY_CARE_PROVIDER_SITE_OTHER): Payer: Self-pay | Admitting: Cardiology

## 2012-09-21 VITALS — BP 124/70 | HR 66 | Ht 67.5 in | Wt 151.6 lb

## 2012-09-21 DIAGNOSIS — I739 Peripheral vascular disease, unspecified: Secondary | ICD-10-CM

## 2012-09-21 DIAGNOSIS — M79609 Pain in unspecified limb: Secondary | ICD-10-CM | POA: Insufficient documentation

## 2012-09-21 DIAGNOSIS — I82409 Acute embolism and thrombosis of unspecified deep veins of unspecified lower extremity: Secondary | ICD-10-CM

## 2012-09-21 DIAGNOSIS — M79651 Pain in right thigh: Secondary | ICD-10-CM

## 2012-09-21 MED ORDER — KETOROLAC TROMETHAMINE 10 MG PO TABS: 10.0000 mg | ORAL_TABLET | Freq: Four times a day (QID) | ORAL | Status: DC | PRN

## 2012-09-21 NOTE — Assessment & Plan Note (Signed)
Pt has had increasing pain of rt. Thigh where the antrectomy and PTA and stent were placed in his Lt SFA.  Dr. Allyson Sabal examined the site as well.  Some discomfort is expected, but this does seem excessive.  Dr. Allyson Sabal asked for brief scan with ultrasound which did not show any dissection or acute problem. He has 3+ post tib. Pulse.  Toradol 10 mg po was given for pain.  Pt will call if symptoms increase, otherwise he will follow up with as previously arranged.

## 2012-09-21 NOTE — Assessment & Plan Note (Signed)
See previous note

## 2012-09-21 NOTE — Telephone Encounter (Signed)
Patient states that he had a stent placed in his leg on Tuesday.  That leg is very tender and hard to bend.  Please call.

## 2012-09-21 NOTE — Patient Instructions (Signed)
Use toradol pain med every 6 hours as needed for pain of rt. Thigh.  The pain should resolve.  No strenous  Activity, no lifting over 10 pounds.  Follow up as previously instructed.

## 2012-09-21 NOTE — Progress Notes (Signed)
09/21/2012   PCP: Kirk Ruths, MD   Chief Complaint  Patient presents with  . Follow-up    s/p stent placement right upper leg - has a pulling sensation at stent site since the procedure was done.    Primary Cardiologist: Dr. Allyson Sabal  HPI:George Cooper is a very pleasant 57 year old thin appearing married Caucasian male father of 2, grandfather of 2 grandchildren works as an Personnel officer. He was referred by Dr. Yetta Numbers.  Pt complained of claudication and Lower extremity arterial Dopplers revealed a high-frequency signal in the proximal segment of his right SFA.  The patient presented for angiography and potential percutaneous intervention for lifestyle limiting claudication. He under went successful turbo [facility] directional atherectomy, PTA and stenting of high-grade tandem proximal right SFA stenoses for lifestyle limiting claudication on 09/18/12.  He did well but did develop Rt mid thigh discomfort that seems to increase in severity.  Pt called to be seen today.  Pain occurs most with sitting.  No other complaints.    No Known Allergies  Current Outpatient Prescriptions  Medication Sig Dispense Refill  . aspirin EC 325 MG EC tablet Take 1 tablet (325 mg total) by mouth daily.  30 tablet    . clopidogrel (PLAVIX) 75 MG tablet Take 1 tablet (75 mg total) by mouth daily with breakfast.  30 tablet  5  . meclizine (ANTIVERT) 25 MG tablet Take 25 mg by mouth See admin instructions. Was taking up to 6 times daily as needed for vertigo (has tapered down and stopped until after the procedure scheduled for 09/18/12)      . ketorolac (TORADOL) 10 MG tablet Take 1 tablet (10 mg total) by mouth every 6 (six) hours as needed for pain.  30 tablet  0   No current facility-administered medications for this visit.    Past Medical History  Diagnosis Date  . Dizziness   . Carotid artery disease   . Claudication     right calf claudication  . Lyme disease 2006    "after bit by a tick"  (09/18/2012)  . Arthritis     "right knee" (09/18/2012)  . History of gout     Past Surgical History  Procedure Laterality Date  . Knee arthroscopy Right 1990's    "twice" (09/18/2012)  . Knee arthroscopy Left 1980's  . Peripheral arterial stent graft Right 09/18/2012    "PTA & stenting right SFA"/notes 09/18/2012    RUE:[id number]:XB colds or fevers, no weight changes Skin:no rashes or ulcers HEENT:no blurred vision, no congestion CV:no chest pain PUL:no SOB-  He tells me he is cutting back on his tobacco. GI:no diarrhea constipation or melena, no indigestion GU:no hematuria, no dysuria MS:no joint pain, + rt thigh pain Neuro:no syncope, no lightheadedness Endo:no diabetes, no thyroid disease  PHYSICAL EXAM BP 124/70  Pulse 66  Ht 5' 7.5" (1.715 m)  Wt 151 lb 9.6 oz (68.765 kg)  BMI 23.38 kg/m2 General:Pleasant affect, NAD Skin:Warm and dry, brisk capillary refill HEENT:normocephalic, sclera clear, mucus membranes moist Heart:S1S2 RRR without murmur, gallup, rub or click Lungs:clear without rales, rhonchi, or wheezes Ext:no lower ext edema,  2+ radial pulses. 2+ post tib pulse on the Rt., Very tender to palpation on the rt thigh. No swelling, no redness. Neuro:alert and oriented, MAE, follows commands, + facial symmetry   ASSESSMENT AND PLAN Pain of right thigh Pt has had increasing pain of rt. Thigh where the antrectomy and PTA and stent were placed in  his Lt SFA.  Dr. Allyson Sabal examined the site as well.  Some discomfort is expected, but this does seem excessive.  Dr. Allyson Sabal asked for brief scan with ultrasound which did not show any dissection or acute problem. He has 3+ post tib. Pulse.  Toradol 10 mg po was given for pain.  Pt will call if symptoms increase, otherwise he will follow up with as previously arranged.   PAD (peripheral artery disease), s/p athrectomy, PTA/ stent Rt. SFA  09/18/2012 See previous note.   Pt was advised to continue cutting back on tobacco. Also if  Toradol does not help with pain, then he may need narcotic.

## 2012-09-21 NOTE — Progress Notes (Signed)
Right Lower Extremity Limited Arterial Duplex  Completed. George Cooper

## 2012-09-21 NOTE — Telephone Encounter (Signed)
Returned call.  Pt stated he had a stent put in his R leg Tuesday.  Pt c/o RLE tenderness w/ walking and to touch.  Stated when he bends his R leg he can feel the stent.  Denied swelling, redness, warmness or discoloration.  Stated L groin site looks good.  Dr. Allyson Sabal notified and advised pt come in for evaluation today w/ an Extender.  Pt informed and agreed w/ plan.  Appt scheduled for today at 3:20 pm w/ L. Annie Paras, NP.

## 2012-09-28 ENCOUNTER — Telehealth: Payer: Self-pay | Admitting: *Deleted

## 2012-09-28 DIAGNOSIS — E785 Hyperlipidemia, unspecified: Secondary | ICD-10-CM

## 2012-09-28 DIAGNOSIS — Z79899 Other long term (current) drug therapy: Secondary | ICD-10-CM

## 2012-09-28 MED ORDER — ATORVASTATIN CALCIUM 40 MG PO TABS: 40.0000 mg | ORAL_TABLET | Freq: Every day | ORAL | Status: DC

## 2012-09-28 NOTE — Telephone Encounter (Signed)
Medication started per Dr Hazle Coca recommendation on George Cooper's labs.  Pt notified

## 2012-09-28 NOTE — Telephone Encounter (Signed)
Message copied by Marella Bile on Fri Sep 28, 2012 12:46 PM ------      Message from: Runell Gess      Created: Wed Sep 19, 2012  7:55 AM       LDL 172. Start Lipitor 40 if not statin intol and re check in 2-3 months ------

## 2012-10-03 ENCOUNTER — Telehealth: Payer: Self-pay | Admitting: Cardiovascular Disease

## 2012-10-03 NOTE — Telephone Encounter (Signed)
Returned call. Pt stated he has been taking Plavix for about two weeks now.  Stated he has been having joint and back pain shortly after starting it.  Pt stated one of the first SEs on the paper he got from the hospital about Plavix is that it can cause joint and back pain.  Pt wants to know if he can stop Plavix and double up on the ASA.  Pt informed he can NOT stop Plavix as he recently had a stent placed and will need to continue therapy.  Pt informed PharmD will be notified for advice regarding his complaints.  Pt agreed to continue Plavix until further instructions given.  Pt also stated he had Lyme disease before and had aching in his joints and wonders if this has anything to do with it.  Pt informed Belenda Cruise, PharmD will be notified for further instructions and he will receive a call from her or either RN after reviewed.  Pt verbalized understanding and agreed w/ plan.  Message forwarded to Belenda Cruise, PharmD.

## 2012-10-03 NOTE — Telephone Encounter (Signed)
Per the answering service-pt called said new medication causing joint pain,can hardly move.

## 2012-10-03 NOTE — Telephone Encounter (Signed)
Pt calling again-can not tolerate the Plavix.Please call asap.

## 2012-10-04 NOTE — Telephone Encounter (Signed)
Spoke with patient, has had relief with APAP bid prn, advised pt to not stop plavix at this time.  IF still a problem in 3-4 weeks he can call back and we can try samples of Effient or Brilinta.  Pt voiced understanding.

## 2012-10-10 ENCOUNTER — Ambulatory Visit (INDEPENDENT_AMBULATORY_CARE_PROVIDER_SITE_OTHER): Payer: Self-pay | Admitting: Cardiovascular Disease

## 2012-10-10 ENCOUNTER — Encounter: Payer: Self-pay | Admitting: Cardiovascular Disease

## 2012-10-10 ENCOUNTER — Other Ambulatory Visit (HOSPITAL_COMMUNITY): Payer: Self-pay | Admitting: Cardiovascular Disease

## 2012-10-10 ENCOUNTER — Encounter (HOSPITAL_COMMUNITY): Payer: Self-pay | Admitting: Cardiovascular Disease

## 2012-10-10 ENCOUNTER — Other Ambulatory Visit (HOSPITAL_COMMUNITY): Payer: Self-pay | Admitting: *Deleted

## 2012-10-10 VITALS — BP 106/72 | HR 64 | Ht 69.0 in | Wt 154.0 lb

## 2012-10-10 DIAGNOSIS — I739 Peripheral vascular disease, unspecified: Secondary | ICD-10-CM

## 2012-10-10 DIAGNOSIS — E785 Hyperlipidemia, unspecified: Secondary | ICD-10-CM

## 2012-10-10 DIAGNOSIS — Z79899 Other long term (current) drug therapy: Secondary | ICD-10-CM

## 2012-10-10 MED ORDER — SIMVASTATIN 20 MG PO TABS: 20.0000 mg | ORAL_TABLET | Freq: Every day | ORAL | Status: DC

## 2012-10-10 NOTE — Patient Instructions (Signed)
  Your physician wants you to follow-up with him in : 6 months with Dr San Morelle will receive a reminder letter in the mail one month in advance. If you don't receive a letter, please call our office to schedule the follow-up appointment.   Your physician recommends that you return for lab work in: 4 months, fasting    Your physician has recommended you make the following change in your medication: stop atorvastation for 6 weeks, then start simvastation 20mg  daily.  Dr Allyson Sabal has recommended that you start Co Q 10 200mg  daily.  This is available over the counter.    Your physician has ordered the following tests: lower extremity arterial dopplers, carotid dopplers, and renal dopplers in the near future and again in 6 months

## 2012-10-10 NOTE — Progress Notes (Signed)
10/10/2012 George Cooper   09/23/1955  [id number]  Primary Physician Kirk Ruths, MD Primary Cardiologist: Runell Gess MD Roseanne Reno   HPI:  Mr. 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 He was referred by Dr. Yetta Numbers , at [facility], in [place] for evaluation of dizziness and carotid artery disease. The symptoms began several weeks ago. They're more positional. He [facility] which revealed mild to moderate bilateral internal carotid artery stenosis. He is otherwise neurologically asymptomatic. He denies chest pain or shortness of breath. He does complain however of right calf claudication. His crit was profile is positive for a 20-pack-year history of tobacco abuse currently smoking one half pack per day. He is not diabetic, hypertensive or hyperlipidemic. His mother did have bypass surgery in her 3s and his sister has had coronary stents as well. Stress test which was normal. Lower extremity arterial Dopplers revealed a high-frequency signal in the proximal segment of his right SFA. On 09/18/12 the patient underwent peripheral angiography, directional atherectomy, PTA and stenting of a high-grade segmental proximal right SFA stenosis. He had excellent angiographic and clinical result. His Doppler show a widely patent proximal right SFA and his claudication has resolved.  Current Outpatient Prescriptions  Medication Sig Dispense Refill  . aspirin EC 325 MG EC tablet Take 1 tablet (325 mg total) by mouth daily.  30 tablet    . atorvastatin (LIPITOR) 40 MG tablet Take 1 tablet (40 mg total) by mouth daily.  30 tablet  6  . clopidogrel (PLAVIX) 75 MG tablet Take 1 tablet (75 mg total) by mouth daily with breakfast.  30 tablet  5  . ketorolac (TORADOL) 10 MG tablet Take 1 tablet (10 mg total) by mouth every 6 (six) hours as  needed for pain.  30 tablet  0  . meclizine (ANTIVERT) 25 MG tablet Take 25 mg by mouth See admin instructions. Was taking up to 6 times daily as needed for vertigo (has tapered down and stopped until after the procedure scheduled for 09/18/12)       No current facility-administered medications for this visit.    No Known Allergies  History   Social History  . Marital Status: Married    Spouse Name: N/A    Number of Children: N/A  . Years of Education: N/A   Occupational History  . Not on file.   Social History Main Topics  . Smoking status: Current Every Day Smoker -- 0.20 packs/day for 40 years    Types: Cigarettes  . Smokeless tobacco: Never Used     Comment: trying to quit, smokes 2-3 daily  . Alcohol Use: No  . Drug Use: Yes    Special: Marijuana     Comment: 09/18/2012 "quit smoking pot 4-5 yr ago"  . Sexual Activity: Not on file   Other Topics Concern  . Not on file   Social History Narrative  . No narrative on file     Review of Systems: General: negative for chills, fever, night sweats or weight changes.  Cardiovascular: negative for chest pain, dyspnea on exertion, edema, orthopnea, palpitations, paroxysmal nocturnal dyspnea or shortness of breath Dermatological: negative for rash Respiratory: negative for cough or wheezing Urologic: negative for hematuria Abdominal: negative for nausea, vomiting, diarrhea, bright red blood per rectum, melena, or hematemesis Neurologic: negative for visual changes, syncope, or dizziness All other systems reviewed  and are otherwise negative except as noted above.    Blood pressure 106/72, pulse 64, height 5\' 9"  (1.753 m), weight 154 lb (69.854 kg).  General appearance: alert and no distress Neck: no adenopathy, no JVD, supple, symmetrical, trachea midline, thyroid not enlarged, symmetric, no tenderness/mass/nodules and bilateral carotid bruits Lungs: clear to auscultation bilaterally Heart: regular rate and rhythm, S1, S2  normal, no murmur, click, rub or gallop Extremities: extremities normal, atraumatic, no cyanosis or edema Pulses: 2+ and symmetric  EKG not performed today  ASSESSMENT AND PLAN:   PAD (peripheral artery disease), s/p athrectomy, PTA/ stent Rt. SFA  09/18/2012 I performed peripheral angiography 09/18/12 revealing high-grade segmental right SFA stenosis. I performed directional atherectomy with the turbo hawk atherectomy device as well as stenting with Nitinol self-expanding stents. Thought Doppler showed a widely patent SFA in his symptoms of claudication have resolved. He is on aspirin and clopidogrel. He is a 2+ right pedal pulse. We'll continue to get Doppler semiannually. I have counseled him about the importance of risk factor modification and smoking cessation  Hyperlipidemia The patient's most recent lipid profile revealed a total cholesterol of 221 LDL 172 HDL of 29. He was placed on atorvastatin which resulted in arthralgias probably related to statin intolerance. I told him to take a "statin holiday" and I provided him a prescription for simvastatin which he has been instructed to begin in 6 weeks should his symptoms resolved after stopping his Lipitor.      Runell Gess MD FACP,FACC,FAHA, [facility] 10/10/2012 9:52 AM

## 2012-10-10 NOTE — Assessment & Plan Note (Signed)
The patient's most recent lipid profile revealed a total cholesterol of 221 LDL 172 HDL of 29. He was placed on atorvastatin which resulted in arthralgias probably related to statin intolerance. I told him to take a "statin holiday" and I provided him a prescription for simvastatin which he has been instructed to begin in 6 weeks should his symptoms resolved after stopping his Lipitor.

## 2012-10-10 NOTE — Assessment & Plan Note (Signed)
I performed peripheral angiography 09/18/12 revealing high-grade segmental right SFA stenosis. I performed directional atherectomy with the turbo hawk atherectomy device as well as stenting with Nitinol self-expanding stents. Thought Doppler showed a widely patent SFA in his symptoms of claudication have resolved. He is on aspirin and clopidogrel. He is a 2+ right pedal pulse. We'll continue to get Doppler semiannually. I have counseled him about the importance of risk factor modification and smoking cessation

## 2012-10-23 ENCOUNTER — Ambulatory Visit (HOSPITAL_COMMUNITY)
Admission: RE | Admit: 2012-10-23 | Discharge: 2012-10-23 | Disposition: A | Discharge status: Home / Self Care | Payer: Self-pay | Source: Ambulatory Visit | Attending: Cardiovascular Disease | Admitting: Cardiovascular Disease

## 2012-10-23 ENCOUNTER — Other Ambulatory Visit (HOSPITAL_COMMUNITY): Payer: Self-pay | Admitting: Cardiovascular Disease

## 2012-10-23 DIAGNOSIS — I779 Disorder of arteries and arterioles, unspecified: Secondary | ICD-10-CM

## 2012-10-23 DIAGNOSIS — I25810 Atherosclerosis of coronary artery bypass graft(s) without angina pectoris: Secondary | ICD-10-CM

## 2012-10-23 DIAGNOSIS — I2510 Atherosclerotic heart disease of native coronary artery without angina pectoris: Secondary | ICD-10-CM | POA: Insufficient documentation

## 2012-10-23 NOTE — Progress Notes (Signed)
Carotid Duplex Completed. °Brianna L Mazza,RVT °

## 2012-10-29 ENCOUNTER — Encounter: Payer: Self-pay | Admitting: *Deleted

## 2012-10-30 ENCOUNTER — Encounter (HOSPITAL_COMMUNITY): Payer: Self-pay

## 2012-10-31 ENCOUNTER — Ambulatory Visit (HOSPITAL_COMMUNITY)
Admission: RE | Admit: 2012-10-31 | Discharge: 2012-10-31 | Disposition: A | Discharge status: Home / Self Care | Payer: Self-pay | Source: Ambulatory Visit | Attending: Cardiovascular Disease | Admitting: Cardiovascular Disease

## 2012-10-31 ENCOUNTER — Other Ambulatory Visit (HOSPITAL_COMMUNITY): Payer: Self-pay | Admitting: Cardiovascular Disease

## 2012-10-31 ENCOUNTER — Ambulatory Visit (HOSPITAL_BASED_OUTPATIENT_CLINIC_OR_DEPARTMENT_OTHER)
Admission: RE | Admit: 2012-10-31 | Discharge: 2012-10-31 | Disposition: A | Discharge status: Home / Self Care | Payer: Self-pay | Source: Ambulatory Visit | Attending: Cardiovascular Disease | Admitting: Cardiovascular Disease

## 2012-10-31 DIAGNOSIS — I739 Peripheral vascular disease, unspecified: Secondary | ICD-10-CM

## 2012-10-31 DIAGNOSIS — I701 Atherosclerosis of renal artery: Secondary | ICD-10-CM | POA: Insufficient documentation

## 2012-10-31 NOTE — Progress Notes (Signed)
Lower Extremity Arterial Duplex Completed. °Brianna L Mazza,RVT °

## 2012-10-31 NOTE — Progress Notes (Signed)
Renal Artery Duplex Completed. °Brianna L Mazza,RVT °

## 2012-11-13 ENCOUNTER — Telehealth: Payer: Self-pay | Admitting: *Deleted

## 2012-11-13 DIAGNOSIS — I739 Peripheral vascular disease, unspecified: Secondary | ICD-10-CM

## 2012-11-13 NOTE — Telephone Encounter (Signed)
Order placed for lower ext doppler in 6 months

## 2012-11-13 NOTE — Telephone Encounter (Signed)
Message copied by Marella Bile on Tue Nov 13, 2012 11:10 AM ------      Message from: Runell Gess      Created: Sun Nov 11, 2012 11:31 AM       No change from prior study. Repeat in 6 months ------

## 2012-11-26 ENCOUNTER — Ambulatory Visit (INDEPENDENT_AMBULATORY_CARE_PROVIDER_SITE_OTHER): Payer: Self-pay | Admitting: Cardiovascular Disease

## 2012-11-26 ENCOUNTER — Encounter: Payer: Self-pay | Admitting: Cardiovascular Disease

## 2012-11-26 VITALS — BP 154/70 | HR 56 | Ht 68.0 in | Wt 154.1 lb

## 2012-11-26 DIAGNOSIS — E785 Hyperlipidemia, unspecified: Secondary | ICD-10-CM

## 2012-11-26 DIAGNOSIS — I739 Peripheral vascular disease, unspecified: Secondary | ICD-10-CM

## 2012-11-26 DIAGNOSIS — I701 Atherosclerosis of renal artery: Secondary | ICD-10-CM

## 2012-11-26 DIAGNOSIS — Z79899 Other long term (current) drug therapy: Secondary | ICD-10-CM

## 2012-11-26 MED ORDER — SIMVASTATIN 20 MG PO TABS: 20.0000 mg | ORAL_TABLET | Freq: Every day | ORAL | Status: DC

## 2012-11-26 MED ORDER — CO Q10 200 MG PO CAPS: 200.0000 mg | ORAL_CAPSULE | ORAL | Status: DC

## 2012-11-26 NOTE — Patient Instructions (Addendum)
START SIMVASTATIN 20 MG AT BEDTIME CoQ1O 200 MG ONCE A DAY   LABS LIPID ,LIVER PROFLIE  Your physician has requested that you have a lower extremity arterial duplex. This test is an ultrasound of the arteries in the legs . It looks at arterial blood flow in the legs. Allow one hour for Lower and  Arterial scans. There are no restrictions or special instructions IN April 2015 along with renal doppler, Your physician has requested that you have a renal artery duplex. During this test, an ultrasound is used to evaluate blood flow to the kidneys. Allow one hour for this exam. Do not eat after midnight the day before and avoid carbonated beverages. Take your medications as you usually do in April 2015.  Your physician recommends that you schedule a follow-up appointment in 6 month with extender  Your physician wants you to follow-up in 12 month Dr Allyson Sabal.  You will receive a reminder letter in the mail two months in advance. If you don't receive a letter, please call our office to schedule the follow-up appointment.

## 2012-11-26 NOTE — Progress Notes (Signed)
11/26/2012 George Cooper   [birth year]  [id number]  Primary Physician George Ruths, MD Primary Cardiologist: George Gess MD George Cooper   HPI:  [name] is a very pleasant 57 year old thin appearing married Caucasian male father of 2, grandfather of 2 grandchildren works as an Personnel officer. He was referred by Dr. Yetta Cooper , at [facility], in [place] for evaluation of dizziness and carotid artery disease. The symptoms began several weeks ago. They're more positional. He had carotid Dopplers performed at [facility] hospital which revealed mild to moderate bilateral internal carotid artery stenosis. He is otherwise neurologically asymptomatic. He denies chest pain or shortness of breath. He does complain however of right calf claudication. His crit was profile is positive for a 20-pack-year history of tobacco abuse currently smoking one half pack per day. He is not diabetic, hypertensive or hyperlipidemic. His mother did have bypass surgery in her 22s and his sister has had coronary stents as well. Stress test which was normal. Lower extremity arterial Dopplers revealed a high-frequency signal in the proximal segment of his right SFA. On 09/18/12 the patient underwent peripheral angiography, directional atherectomy, PTA and stenting of a high-grade segmental proximal right SFA stenosis. He had excellent angiographic and clinical result. His Doppler show a widely patent proximal right SFA and his claudication has resolved. Since I saw him in the office approximately 6 weeks ago he denies chest pain, shortness of breath or claudication.    Current Outpatient Prescriptions  Medication Sig Dispense Refill  . aspirin EC 325 MG EC tablet Take 1 tablet (325 mg total) by mouth daily.  30 tablet    . clopidogrel (PLAVIX) 75 MG tablet Take 1 tablet (75 mg total) by mouth daily with breakfast.  30 tablet  5   No current facility-administered medications for this visit.     No Known Allergies  History   Social History  . Marital Status: Married    Spouse Name: N/A    Number of Children: N/A  . Years of Education: N/A   Occupational History  . Not on file.   Social History Main Topics  . Smoking status: Current Every Day Smoker -- 0.20 packs/day for 40 years    Types: Cigarettes  . Smokeless tobacco: Never Used     Comment: trying to quit, smokes 2-3 daily  . Alcohol Use: No  . Drug Use: Yes    Special: Marijuana     Comment: 09/18/2012 "quit smoking pot 4-5 yr ago"  . Sexual Activity: Not on file   Other Topics Concern  . Not on file   Social History Narrative  . No narrative on file     Review of Systems: General: negative for chills, fever, night sweats or weight changes.  Cardiovascular: negative for chest pain, dyspnea on exertion, edema, orthopnea, palpitations, paroxysmal nocturnal dyspnea or shortness of breath Dermatological: negative for rash Respiratory: negative for cough or wheezing Urologic: negative for hematuria Abdominal: negative for nausea, vomiting, diarrhea, bright red blood per rectum, melena, or hematemesis Neurologic: negative for visual changes, syncope, or dizziness All other systems reviewed and are otherwise negative except as noted above.    Blood pressure 154/70, pulse 56, height 5\' 8"  (1.727 m), weight 154 lb 1.6 oz (69.899 kg).  General appearance: alert and no distress Neck: no adenopathy, no carotid bruit, no JVD, supple, symmetrical, trachea midline and thyroid not enlarged, symmetric, no tenderness/mass/nodules Lungs: clear to auscultation bilaterally Heart: regular rate and rhythm, S1, S2 normal,  no murmur, click, rub or gallop Extremities: extremities normal, atraumatic, no cyanosis or edema and 2+ right pedal pulse  EKG not performed today  ASSESSMENT AND PLAN:   Claudication I performed [place]bo [place] directional arthrectomy, PTA and stenting of his right SFA 08/21/12. Followup Dopplers done  10/31/12 revealed a right ABI of 1 the widely patent right SFA. He did have an 80% right and a 40% left renal artery stenosis at the time of angiography. Renal Dopplers performed 10/31/12 revealed a right renal aortic ratio 3.14 and a left of 5.05. He also has mild to moderate disease in his left carotid bulb. Neurologically a symptomatic on aspirin Plavix. We will recheck the lower extremity arterial Doppler studies in 6 months.  Hyperlipidemia Apparently he was on Lipitor in the past and had side effects. I'm going to begin him on Zocor 20 mg  As well as Co Q10 and we'll recheck a lipid liver profile in 2 months      George Gess MD [facility], [facility] 11/26/2012 10:16 AM

## 2012-11-26 NOTE — Assessment & Plan Note (Signed)
Apparently he was on Lipitor in the past and had side effects. I'm going to begin him on Zocor 20 mg  As well as Co Q10 and we'll recheck a lipid liver profile in 2 months

## 2012-11-26 NOTE — Assessment & Plan Note (Signed)
I performed Turbo Hawk directional arthrectomy, PTA and stenting of his right SFA 08/21/12. Followup Dopplers done 10/31/12 revealed a right ABI of 1 the widely patent right SFA. He did have an 80% right and a 40% left renal artery stenosis at the time of angiography. Renal Dopplers performed 10/31/12 revealed a right renal aortic ratio 3.14 and a left of 5.05. He also has mild to moderate disease in his left carotid bulb. Neurologically a symptomatic on aspirin Plavix. We will recheck the lower extremity arterial Doppler studies in 6 months.

## 2013-03-19 ENCOUNTER — Other Ambulatory Visit: Payer: Self-pay | Admitting: *Deleted

## 2013-03-19 MED ORDER — CLOPIDOGREL BISULFATE 75 MG PO TABS: 75.0000 mg | ORAL_TABLET | Freq: Every day | ORAL | Status: DC

## 2013-04-12 ENCOUNTER — Encounter (HOSPITAL_COMMUNITY): Payer: Self-pay | Admitting: *Deleted

## 2013-05-14 ENCOUNTER — Ambulatory Visit (HOSPITAL_COMMUNITY)
Admission: RE | Admit: 2013-05-14 | Discharge: 2013-05-14 | Disposition: A | Discharge status: Home / Self Care | Payer: BC Managed Care – PPO | Source: Ambulatory Visit | Attending: Internal Medicine | Admitting: Internal Medicine

## 2013-05-14 ENCOUNTER — Ambulatory Visit (HOSPITAL_BASED_OUTPATIENT_CLINIC_OR_DEPARTMENT_OTHER)
Admission: RE | Admit: 2013-05-14 | Discharge: 2013-05-14 | Disposition: A | Discharge status: Home / Self Care | Payer: BC Managed Care – PPO | Source: Ambulatory Visit | Attending: Cardiovascular Disease | Admitting: Cardiovascular Disease

## 2013-05-14 DIAGNOSIS — I701 Atherosclerosis of renal artery: Secondary | ICD-10-CM | POA: Insufficient documentation

## 2013-05-14 DIAGNOSIS — I739 Peripheral vascular disease, unspecified: Secondary | ICD-10-CM | POA: Insufficient documentation

## 2013-05-14 NOTE — Progress Notes (Signed)
Renal Duplex Completed. George Cooper, BS, RDMS, RVT  

## 2013-05-14 NOTE — Progress Notes (Signed)
Arterial Duplex Lower Ext. Completed. Jayro Mcmath, BS, RDMS, RVT  

## 2013-05-21 ENCOUNTER — Encounter: Payer: Self-pay | Admitting: *Deleted

## 2013-05-21 ENCOUNTER — Telehealth: Payer: Self-pay | Admitting: *Deleted

## 2013-05-21 DIAGNOSIS — I739 Peripheral vascular disease, unspecified: Secondary | ICD-10-CM

## 2013-05-21 NOTE — Telephone Encounter (Signed)
Order placed for repeat renal dopplers in 6 months Order placed for repeat lower extremity arterial doppler in 1 year

## 2013-05-21 NOTE — Telephone Encounter (Signed)
Message copied by Marella BileVOGEL, Tavious Griesinger W. on Tue May 21, 2013  4:57 PM ------      Message from: Runell GessBERRY, JONATHAN J      Created: Sun May 19, 2013  1:07 PM       No change from prior study. Repeat in 12 months. ------

## 2013-05-27 ENCOUNTER — Ambulatory Visit: Payer: Self-pay | Admitting: Cardiology

## 2013-05-27 ENCOUNTER — Telehealth: Payer: Self-pay | Admitting: Cardiovascular Disease

## 2013-05-27 DIAGNOSIS — E782 Mixed hyperlipidemia: Secondary | ICD-10-CM

## 2013-05-27 DIAGNOSIS — Z79899 Other long term (current) drug therapy: Secondary | ICD-10-CM

## 2013-05-27 NOTE — Telephone Encounter (Signed)
Message forwarded to L. Ingold, NP. 

## 2013-05-27 NOTE — Telephone Encounter (Signed)
He was supposed to have lipid panel, if not done he needs to have done,  to help keep arteries in legs open.  Dr. Allyson SabalBerry usually like his pt's seen every 6 months.  Glad he is doing well.

## 2013-05-27 NOTE — Telephone Encounter (Signed)
Pt is cancelling appt tomorrow with Vernona RiegerLaura. Pt wanted you to know that he have not smoke a cigarette in 6 months.Pt said his test came back normal,did not see the need for the appt.

## 2013-05-28 ENCOUNTER — Ambulatory Visit: Payer: Self-pay | Admitting: Cardiology

## 2013-05-28 NOTE — Telephone Encounter (Signed)
Lab(s) ordered and Lab slip mailed.   Returned call and pt verified x 2.  Pt informed message received and advised to have labs drawn per Nada BoozerLaura Ingold, NP.  Will mail lab slip.  Pt verbalized understanding and agreed w/ plan.

## 2013-10-15 ENCOUNTER — Other Ambulatory Visit: Payer: Self-pay | Admitting: Cardiovascular Disease

## 2013-10-15 NOTE — Telephone Encounter (Signed)
Rx was sent to pharmacy electronically. 

## 2013-11-05 ENCOUNTER — Other Ambulatory Visit (HOSPITAL_COMMUNITY): Payer: Self-pay | Admitting: Physician Assistant

## 2013-11-05 ENCOUNTER — Telehealth: Payer: Self-pay | Admitting: Cardiovascular Disease

## 2013-11-05 DIAGNOSIS — R109 Unspecified abdominal pain: Secondary | ICD-10-CM

## 2013-11-05 NOTE — Telephone Encounter (Signed)
Had a stent placed last July and the area in which Dr.Berry went in on his left leg is very tender. Starting hurting really bad on Sunday and continue to hurt and there is a little knot and very tender to the touch . Please Call

## 2013-11-05 NOTE — Telephone Encounter (Signed)
Returned call to patient he stated he bent over this past Sunday night 11/03/13 and had pain in left groin area where he had a pv cath last year.Stated pain is worse today and has noticed a dime sized nodule.Spoke to DOD Dr.Hilty he advised since cath was done 1 year ago artery healed and probably something else.Advised needs to see PCP.

## 2013-11-06 ENCOUNTER — Other Ambulatory Visit (HOSPITAL_COMMUNITY): Payer: Self-pay | Admitting: Physician Assistant

## 2013-11-06 ENCOUNTER — Ambulatory Visit (HOSPITAL_COMMUNITY)
Admission: RE | Admit: 2013-11-06 | Discharge: 2013-11-06 | Disposition: A | Discharge status: Home / Self Care | Payer: BC Managed Care – PPO | Source: Ambulatory Visit | Attending: Physician Assistant | Admitting: Physician Assistant

## 2013-11-06 DIAGNOSIS — R1909 Other intra-abdominal and pelvic swelling, mass and lump: Secondary | ICD-10-CM | POA: Diagnosis not present

## 2013-11-06 DIAGNOSIS — R109 Unspecified abdominal pain: Secondary | ICD-10-CM

## 2014-01-11 ENCOUNTER — Other Ambulatory Visit: Payer: Self-pay | Admitting: Cardiovascular Disease

## 2014-01-13 NOTE — Telephone Encounter (Signed)
Rx was sent to pharmacy electronically. 

## 2014-01-30 ENCOUNTER — Encounter (HOSPITAL_COMMUNITY): Payer: Self-pay | Admitting: Cardiovascular Disease

## 2014-02-12 ENCOUNTER — Other Ambulatory Visit: Payer: Self-pay | Admitting: Cardiovascular Disease

## 2014-02-12 NOTE — Telephone Encounter (Signed)
Rx(s) sent to pharmacy electronically.  

## 2014-02-27 ENCOUNTER — Other Ambulatory Visit: Payer: Self-pay | Admitting: Cardiovascular Disease

## 2014-02-27 NOTE — Telephone Encounter (Signed)
Rx(s) sent to pharmacy electronically. LM that patient needs an appointment.

## 2014-03-03 ENCOUNTER — Telehealth: Payer: Self-pay | Admitting: Cardiovascular Disease

## 2014-03-03 NOTE — Telephone Encounter (Signed)
Closed Encounter  °

## 2014-03-28 ENCOUNTER — Other Ambulatory Visit: Payer: Self-pay | Admitting: Cardiovascular Disease

## 2014-03-28 NOTE — Telephone Encounter (Signed)
Rx(s) sent to pharmacy electronically. Message sent to Dr. Hazle CocaBerry's scheduler Willaim ShengBillie to contact patient for appointment

## 2014-04-01 ENCOUNTER — Encounter: Payer: Self-pay | Admitting: Cardiovascular Disease

## 2014-04-01 ENCOUNTER — Ambulatory Visit (INDEPENDENT_AMBULATORY_CARE_PROVIDER_SITE_OTHER): Payer: Self-pay | Admitting: Cardiovascular Disease

## 2014-04-01 VITALS — BP 132/92 | HR 62 | Ht 68.5 in | Wt 163.0 lb

## 2014-04-01 DIAGNOSIS — E785 Hyperlipidemia, unspecified: Secondary | ICD-10-CM

## 2014-04-01 DIAGNOSIS — I739 Peripheral vascular disease, unspecified: Secondary | ICD-10-CM

## 2014-04-01 DIAGNOSIS — I701 Atherosclerosis of renal artery: Secondary | ICD-10-CM

## 2014-04-01 MED ORDER — CLOPIDOGREL BISULFATE 75 MG PO TABS: 75.0000 mg | ORAL_TABLET | Freq: Every day | ORAL | Status: DC

## 2014-04-01 NOTE — Assessment & Plan Note (Signed)
History of peripheral arterial disease status post intervention on his proximal right SFA by myself 09/18/12 with directional atherectomy and stenting. His follow-up Dopplers performed 05/14/13 revealed normal ABIs bilaterally. He does have mild right calf claudication with significant ambulation and exercise. At the time of angiography he was noted to have an 80% right renal artery stenosis which we have been following by duplex ultrasound as well

## 2014-04-01 NOTE — Patient Instructions (Signed)
Your physician wants you to follow-up in 1 year with Dr. Allyson SabalBerry. You will receive a reminder letter in the mail 2 months in advance. If you do not receive a letter, please call our office to schedule the follow-up appointment.   Next month you are due for:  Renal Artery Doppler- During this test, an ultrasound is used to evaluate blood flow to the kidneys. Allow one hour for this exam. Do not eat after midnight the day before and avoid carbonated beverages. Take your medications as you usually do. Lower extremity arterial doppler- During this test, ultrasound is used to evaluate arterial blood flow in the legs. Allow approximately one hour for this exam.

## 2014-04-01 NOTE — Progress Notes (Signed)
04/01/2014 George Cooper   [birth year]  [id number]  Primary Physician Lenise Herald, PA-C Primary Cardiologist: Runell Gess MD Roseanne Reno   HPI:   Mr. Gavitt is a very pleasant 59 year old thin appearing married Caucasian male father of 2, grandfather of 2 grandchildren works as an Personnel officer. He was referred by Dr. Yetta Numbers , at [facility], in [place] for evaluation of dizziness and carotid artery disease. He denies chest pain or shortness of breath. He did complain however of right calf claudication. His cardiovascular risk profile is positive for a 20-pack-year history of tobacco abuse discontinued 10/14. He is not diabetic, hypertensive or hyperlipidemic. His mother did have bypass surgery in her 47s and his sister has had coronary stents as well. A stress test which was normal. Lower extremity arterial Dopplers revealed a high-frequency signal in the proximal segment of his right SFA. On 09/18/12 the patient underwent peripheral angiography, directional atherectomy, PTA and stenting of a high-grade segmental proximal right SFA stenosis. He had excellent angiographic and clinical result. His Doppler show a widely patent proximal right SFA and his claudication has resolved. His lower extremity arterial Doppler studies performed 05/14/13 revealed a widely patent stent with ABIs 1 bilaterally. He complains of mild right calf claudication with significant exertion.   Current Outpatient Prescriptions  Medication Sig Dispense Refill  . aspirin EC 325 MG EC tablet Take 1 tablet (325 mg total) by mouth daily. 30 tablet   . clopidogrel (PLAVIX) 75 MG tablet Take 1 tablet (75 mg total) by mouth daily. 30 tablet 11   No current facility-administered medications for this visit.    No Known Allergies  History   Social History  . Marital Status: Married    Spouse Name: N/A    Number of Children: N/A  . Years of Education: N/A   Occupational History  . Not on  file.   Social History Main Topics  . Smoking status: Former Smoker -- 0.20 packs/day for 40 years    Types: Cigarettes    Quit date: 11/29/2012  . Smokeless tobacco: Never Used     Comment: trying to quit, smokes 2-3 daily  . Alcohol Use: No  . Drug Use: Yes    Special: Marijuana     Comment: 09/18/2012 "quit smoking pot 4-5 yr ago"  . Sexual Activity: Not on file   Other Topics Concern  . Not on file   Social History Narrative     Review of Systems: General: negative for chills, fever, night sweats or weight changes.  Cardiovascular: negative for chest pain, dyspnea on exertion, edema, orthopnea, palpitations, paroxysmal nocturnal dyspnea or shortness of breath Dermatological: negative for rash Respiratory: negative for cough or wheezing Urologic: negative for hematuria Abdominal: negative for nausea, vomiting, diarrhea, bright red blood per rectum, melena, or hematemesis Neurologic: negative for visual changes, syncope, or dizziness All other systems reviewed and are otherwise negative except as noted above.    Blood pressure 132/92, pulse 62, height 5' 8.5" (1.74 m), weight 163 lb (73.936 kg).  General appearance: alert and no distress Neck: no adenopathy, no JVD, supple, symmetrical, trachea midline, thyroid not enlarged, symmetric, no tenderness/mass/nodules and soft right carotid bruit Lungs: clear to auscultation bilaterally Heart: regular rate and rhythm, S1, S2 normal, no murmur, click, rub or gallop Extremities: extremities normal, atraumatic, no cyanosis or edema  EKG normal sinus rhythm at 62 with incomplete right bundle branch block. I personally reviewed this EKG  ASSESSMENT AND PLAN:  PAD (peripheral artery disease), s/p athrectomy, PTA/ stent Rt. SFA  09/18/2012 History of peripheral arterial disease status post intervention on his proximal right SFA by myself 09/18/12 with directional atherectomy and stenting. His follow-up Dopplers performed 05/14/13  revealed normal ABIs bilaterally. He does have mild right calf claudication with significant ambulation and exercise. At the time of angiography he was noted to have an 80% right renal artery stenosis which we have been following by duplex ultrasound as well   Hyperlipidemia History of hyperlipidemia intolerant to statin drugs. His last lipid profile in our chart performed 09/05/12 revealed a total cholesterol of 220, LDL 172 and HDL of 29. I did try him on Lipitor and Zocor both of which she was intolerant to. We have talked about the PCSK9  monoclonal injectables although he does not have insurance and therefore probably would not be able to afford these.       Runell GessJonathan J. Jalila Goodnough MD FACP,FACC,FAHA, [facility] 04/01/2014 8:53 AM

## 2014-04-01 NOTE — Assessment & Plan Note (Signed)
History of hyperlipidemia intolerant to statin drugs. His last lipid profile in our chart performed 09/05/12 revealed a total cholesterol of 220, LDL 172 and HDL of 29. I did try him on Lipitor and Zocor both of which she was intolerant to. We have talked about the PCSK9  monoclonal injectables although he does not have insurance and therefore probably would not be able to afford these.

## 2014-04-30 ENCOUNTER — Ambulatory Visit (HOSPITAL_BASED_OUTPATIENT_CLINIC_OR_DEPARTMENT_OTHER)
Admission: RE | Admit: 2014-04-30 | Discharge: 2014-04-30 | Disposition: A | Discharge status: Home / Self Care | Payer: Self-pay | Source: Ambulatory Visit | Attending: Internal Medicine | Admitting: Internal Medicine

## 2014-04-30 ENCOUNTER — Ambulatory Visit (HOSPITAL_COMMUNITY)
Admission: RE | Admit: 2014-04-30 | Discharge: 2014-04-30 | Disposition: A | Discharge status: Home / Self Care | Payer: Self-pay | Source: Ambulatory Visit | Attending: Internal Medicine | Admitting: Internal Medicine

## 2014-04-30 DIAGNOSIS — I739 Peripheral vascular disease, unspecified: Secondary | ICD-10-CM | POA: Insufficient documentation

## 2014-04-30 DIAGNOSIS — I701 Atherosclerosis of renal artery: Secondary | ICD-10-CM | POA: Insufficient documentation

## 2014-04-30 NOTE — Progress Notes (Signed)
Arterial Duplex Lower Ext. Completed. Reymundo Winship, BS, RDMS, RVT  

## 2014-04-30 NOTE — Progress Notes (Signed)
Renal Duplex Completed. Robel Wuertz, BS, RDMS, RVT  

## 2014-05-06 ENCOUNTER — Encounter: Payer: Self-pay | Admitting: *Deleted

## 2015-03-16 ENCOUNTER — Other Ambulatory Visit: Payer: Self-pay | Admitting: Cardiovascular Disease

## 2015-03-16 NOTE — Telephone Encounter (Signed)
Rx request sent to pharmacy.  

## 2015-05-16 ENCOUNTER — Other Ambulatory Visit: Payer: Self-pay | Admitting: Cardiovascular Disease

## 2015-05-18 NOTE — Telephone Encounter (Signed)
NEEDS AN APPOINTMENT

## 2015-07-15 ENCOUNTER — Other Ambulatory Visit: Payer: Self-pay | Admitting: Cardiovascular Disease

## 2015-07-15 NOTE — Telephone Encounter (Signed)
New message      *STAT* If patient is at the pharmacy, call can be transferred to refill team.   1. Which medications need to be refilled? (please list name of each medication and dose if known) plavix 75   2. Which pharmacy/location (including street and city if local pharmacy) is medication to be sent to? [facility] - [place]   3. Do they need a 30 day or 90 day supply? 30 days

## 2015-08-14 ENCOUNTER — Encounter: Payer: Self-pay | Admitting: Cardiovascular Disease

## 2015-08-14 ENCOUNTER — Other Ambulatory Visit: Payer: Self-pay | Admitting: Cardiovascular Disease

## 2015-08-14 ENCOUNTER — Ambulatory Visit (INDEPENDENT_AMBULATORY_CARE_PROVIDER_SITE_OTHER): Payer: Self-pay | Admitting: Cardiovascular Disease

## 2015-08-14 VITALS — BP 122/72 | HR 60 | Ht 68.0 in | Wt 166.8 lb

## 2015-08-14 DIAGNOSIS — I779 Disorder of arteries and arterioles, unspecified: Secondary | ICD-10-CM

## 2015-08-14 DIAGNOSIS — E785 Hyperlipidemia, unspecified: Secondary | ICD-10-CM

## 2015-08-14 DIAGNOSIS — I739 Peripheral vascular disease, unspecified: Secondary | ICD-10-CM

## 2015-08-14 NOTE — Assessment & Plan Note (Signed)
History of peripheral arterial disease status post angiography by myself 09/18/2012 revealing 80% right renal artery stenosis, 40-50% left common iliac artery stenosis and high-grade 80% segmental proximal right SFA stenosis with three-vessel runoff. He underwent stenting of his right SFA successfully. Follow-up renal Dopplers did not suggest significant disease and will actually Dopplers last performed 04/30/14 revealed ABIs greater than 1 bilaterally. The patient denies claudication and remains on dual antiplatelet therapy. We will repeat renal and lower extremity arterial Doppler studies.

## 2015-08-14 NOTE — Assessment & Plan Note (Signed)
History of mild to moderate carotid disease by duplex ultrasound. His last ultrasound was in 2014. We will repeat carotid Doppler studies

## 2015-08-14 NOTE — Progress Notes (Signed)
08/14/2015 George Cooper   Apr 12, 1955  [id number]  Primary Physician George Cooper, George Cooper Primary Cardiologist: George GessJonathan J Berry MD George RenoFACP, FACC, FAHA, FSCAI  HPI:  [name] is a very pleasant 60 year old thin appearing married Caucasian male father of 2, grandfather of 2 grandchildren works as an Personnel officerelectrician. He was [facility] Yetta NumbersBill Cooper , [facility], in [place] for evaluation of dizziness and carotid artery disease. I last saw him in the office 04/01/14.He denies chest pain or shortness of breath. He did complain however of right calf claudication. His cardiovascular risk profile is positive for a 20-pack-year history of tobacco abuse discontinued 10/14. He is not diabetic, hypertensive or hyperlipidemic. His mother did have bypass surgery in her 2880s and his sister has had coronary stents as well. A stress test which was normal. Lower extremity arterial Dopplers revealed a high-frequency signal in the proximal segment of his right SFA. On 09/18/12 the patient underwent peripheral angiography, directional atherectomy, PTA and stenting of a high-grade segmental proximal right SFA stenosis. He had excellent angiographic and clinical result. His Doppler show a widely patent proximal right SFA and his claudication has resolved. His lower extremity arterial Doppler studies performed 05/14/13 revealed a widely patent stent with ABIs 1 bilaterally. He complains of mild right calf claudication with significant exertion. Since I saw him last he remained stable. He denies chest pain, shortness of breath or significant claudication.   Current Outpatient Prescriptions  Medication Sig Dispense Refill  . aspirin EC 325 MG EC tablet Take 1 tablet (325 mg total) by mouth daily. 30 tablet   . clopidogrel (PLAVIX) 75 MG tablet Take 75 mg by mouth daily.    . tamsulosin (FLOMAX) 0.4 MG CAPS capsule Take 0.4 mg by mouth daily.     No current facility-administered medications for this visit.     No Known Allergies  Social History   Social History  . Marital Status: Married    Spouse Name: N/A  . Number of Children: N/A  . Years of Education: N/A   Occupational History  . Not on file.   Social History Main Topics  . Smoking status: Former Smoker -- 0.20 packs/day for 40 years    Types: Cigarettes    Quit date: 11/29/2012  . Smokeless tobacco: Never Used     Comment: trying to quit, smokes 2-3 daily  . Alcohol Use: No  . Drug Use: Yes    Special: Marijuana     Comment: 09/18/2012 "quit smoking pot 4-5 yr ago"  . Sexual Activity: Not on file   Other Topics Concern  . Not on file   Social History Narrative     Review of Systems: General: negative for chills, fever, night sweats or weight changes.  Cardiovascular: negative for chest pain, dyspnea on exertion, edema, orthopnea, palpitations, paroxysmal nocturnal dyspnea or shortness of breath Dermatological: negative for rash Respiratory: negative for cough or wheezing Urologic: negative for hematuria Abdominal: negative for nausea, vomiting, diarrhea, bright red blood per rectum, melena, or hematemesis Neurologic: negative for visual changes, syncope, or dizziness All other systems reviewed and are otherwise negative except as noted above.    Blood pressure 122/72, pulse 60, height 5\' 8"  (1.727 m), weight 166 lb 12.8 oz (75.66 kg).  General appearance: alert and no distress Neck: no adenopathy, no carotid bruit, no JVD, supple, symmetrical, trachea midline and thyroid not enlarged, symmetric, no tenderness/mass/nodules Lungs: clear to auscultation bilaterally Heart: regular rate and rhythm, S1, S2 normal,  no murmur, click, rub or gallop Extremities: extremities normal, atraumatic, no cyanosis or edema  EKG normal sinus rhythm at 60 withinferior Q waves, anterolateral Q waves and RSR prime suggesting RV conduction delay.I personally reviewed this EKG. His is unchanged from his last EKG.  ASSESSMENT AND  PLAN:   Carotid artery disease History of mild to moderate carotid disease by duplex ultrasound. His last ultrasound was in 2014. We will repeat carotid Doppler studies  PAD (peripheral artery disease), s/p athrectomy, PTA/ stent Rt. SFA  09/18/2012 History of peripheral arterial disease status post angiography by myself 09/18/2012 revealing 80% right renal artery stenosis, 40-50% left common iliac artery stenosis and high-grade 80% segmental proximal right SFA stenosis with three-vessel runoff. He underwent stenting of his right SFA successfully. Follow-up renal Dopplers did not suggest significant disease and will actually Dopplers last performed 04/30/14 revealed ABIs greater than 1 bilaterally. The patient denies claudication and remains on dual antiplatelet therapy. We will repeat renal and lower extremity arterial Doppler studies.  Hyperlipidemia History of hyperlipidemia. He is statin intolerant. We will have him see George Cooper discuss PCS K529monoclonal injectables.      George GessJonathan J. Berry MD FACP,FACC,FAHA, [facility] 08/14/2015 9:44 AM

## 2015-08-14 NOTE — Patient Instructions (Addendum)
Medication Instructions:  Your physician recommends that you continue on your current medications as directed. Please refer to the Current Medication list given to you today.   Labwork: Your physician recommends that you return for lab work in: AT YOUR EARLIEST CONVENIENCE- FASTING.   Testing/Procedures: Your physician has requested that you have a carotid duplex. This test is an ultrasound of the carotid arteries in your neck. It looks at blood flow through these arteries that supply the brain with blood. Allow one hour for this exam. There are no restrictions or special instructions.  Your physician has requested that you have a lower extremity arterial doppler- During this test, ultrasound is used to evaluate arterial blood flow in the legs. Allow approximately one hour for this exam.   Your physician has requested that you have a renal artery duplex. During this test, an ultrasound is used to evaluate blood flow to the kidneys. Allow one hour for this exam. Do not eat after midnight the day before and avoid carbonated beverages. Take your medications as you usually do.    Follow-Up: Your physician wants you to follow-up in: 12 MONTHS WITH DR Allyson SabalBERRY. You will receive a reminder letter in the mail two months in advance. If you don't receive a letter, please call our office to schedule the follow-up appointment.   You have been referred to THE LIPID CLINIC WITH OUR PHARMACY. WE WILL SCHEDULE AN APPOINTMENT FOR YOU TO MEET WITH THEM.   Any Other Special Instructions Will Be Listed Below (If Applicable).     If you need a refill on your cardiac medications before your next appointment, please call your pharmacy.

## 2015-08-14 NOTE — Assessment & Plan Note (Signed)
History of hyperlipidemia. He is statin intolerant. We will have him see Kristinto discuss PCS K589monoclonal injectables.

## 2015-08-17 ENCOUNTER — Other Ambulatory Visit: Payer: Self-pay | Admitting: Cardiovascular Disease

## 2015-08-17 DIAGNOSIS — I739 Peripheral vascular disease, unspecified: Secondary | ICD-10-CM

## 2015-08-20 ENCOUNTER — Other Ambulatory Visit: Payer: Self-pay | Admitting: Cardiovascular Disease

## 2015-08-21 ENCOUNTER — Telehealth: Payer: Self-pay | Admitting: Cardiovascular Disease

## 2015-08-21 LAB — HEPATIC FUNCTION PANEL
ALBUMIN: 4.5 g/dL (ref 3.6–4.8)
ALK PHOS: 87 IU/L (ref 39–117)
ALT: 14 IU/L (ref 0–44)
AST: 24 IU/L (ref 0–40)
BILIRUBIN TOTAL: 0.3 mg/dL (ref 0.0–1.2)
Bilirubin, Direct: 0.08 mg/dL (ref 0.00–0.40)
Total Protein: 7 g/dL (ref 6.0–8.5)

## 2015-08-21 LAB — LIPID PANEL W/O CHOL/HDL RATIO
Cholesterol, Total: 228 mg/dL — ABNORMAL HIGH (ref 100–199)
HDL: 35 mg/dL — ABNORMAL LOW (ref >39)
LDL Calculated: 173 mg/dL — ABNORMAL HIGH (ref 0–99)
Triglycerides: 99 mg/dL (ref 0–149)
VLDL Cholesterol Cal: 20 mg/dL (ref 5–40)

## 2015-08-21 NOTE — Telephone Encounter (Signed)
Returned call to pt. Gave him his lab results and discussed with him the values and what is normal. Appt made with lipid clinic. He verbalized understanding.

## 2015-08-21 NOTE — Telephone Encounter (Signed)
New message   Pt is calling he said he is returning a call bout his cholesterol

## 2015-08-24 ENCOUNTER — Inpatient Hospital Stay (HOSPITAL_COMMUNITY): Admission: RE | Admit: 2015-08-24 | Payer: Self-pay | Source: Ambulatory Visit

## 2015-08-27 ENCOUNTER — Ambulatory Visit: Payer: Self-pay

## 2015-08-27 ENCOUNTER — Encounter (HOSPITAL_COMMUNITY): Payer: Self-pay

## 2015-08-27 DIAGNOSIS — I779 Disorder of arteries and arterioles, unspecified: Secondary | ICD-10-CM

## 2015-08-27 DIAGNOSIS — I739 Peripheral vascular disease, unspecified: Secondary | ICD-10-CM

## 2015-08-28 LAB — VAS US CAROTID
LCCADDIAS: -46 cm/s
LCCADSYS: -187 cm/s
LEFT ECA DIAS: -22 cm/s
LEFT VERTEBRAL DIAS: -23 cm/s
LICADSYS: -106 cm/s
LICAPDIAS: -28 cm/s
Left CCA prox dias: 32 cm/s
Left CCA prox sys: 139 cm/s
Left ICA dist dias: -39 cm/s
Left ICA prox sys: -106 cm/s
RCCAPSYS: 99 cm/s
RIGHT ECA DIAS: -13 cm/s
RIGHT VERTEBRAL DIAS: -17 cm/s
Right CCA prox dias: 24 cm/s
Right cca dist sys: -109 cm/s

## 2015-08-28 LAB — VAS US LOWER EXTREMITY ARTERIAL DUPLEX
RIGHT ANT DIST TIBAL SYS PSV: 81 cm/s
RSFMPSV: 198 cm/s
RSFPPSV: 138 cm/s
RTIBDISTSYS: 94 cm/s
Right peroneal sys PSV: 88 cm/s
Right popliteal dist sys PSV: 81 cm/s
Right popliteal prox sys PSV: 78 cm/s
Right super femoral dist sys PSV: -434 cm/s

## 2015-09-01 ENCOUNTER — Ambulatory Visit (INDEPENDENT_AMBULATORY_CARE_PROVIDER_SITE_OTHER): Payer: Self-pay | Admitting: Pharmacist

## 2015-09-01 ENCOUNTER — Encounter: Payer: Self-pay | Admitting: Pharmacist

## 2015-09-01 VITALS — Wt 166.4 lb

## 2015-09-01 DIAGNOSIS — E785 Hyperlipidemia, unspecified: Secondary | ICD-10-CM

## 2015-09-01 NOTE — Patient Instructions (Signed)
We will submit for Repatha today Please call [phone] if you have any questions or when you receive your first shipment of medication.   Cholesterol Cholesterol is a white, waxy, fat-like substance needed by your body in small amounts. The liver makes all the cholesterol you need. Cholesterol is carried from the liver by the blood through the blood vessels. Deposits of cholesterol (plaque) may build up on blood vessel walls. These make the arteries narrower and stiffer. Cholesterol plaques increase the risk for heart attack and stroke.  You cannot feel your cholesterol level even if it is very high. The only way to know it is high is with a blood test. Once you know your cholesterol levels, you should keep a record of the test results. Work with your health care provider to keep your levels in the desired range.  WHAT DO THE RESULTS MEAN?  Total cholesterol is a rough measure of all the cholesterol in your blood.   LDL is the so-called bad cholesterol. This is the type that deposits cholesterol in the walls of the arteries. You want this level to be low.   HDL is the good cholesterol because it cleans the arteries and carries the LDL away. You want this level to be high.  Triglycerides are fat that the body can either burn for energy or store. High levels are closely linked to heart disease.  WHAT ARE THE DESIRED LEVELS OF CHOLESTEROL?  Total cholesterol below 200.   LDL below 100 for people at risk, below 70 for those at very high risk.   HDL above 50 is good, above 60 is best.   Triglycerides below 150.  HOW CAN I LOWER MY CHOLESTEROL?  Diet. Follow your diet programs as directed by your health care provider.   Choose fish or white meat chicken and [place]key, roasted or baked. Limit fatty cuts of red meat, fried foods, and processed meats, such as sausage and lunch meats.   Eat lots of fresh fruits and vegetables.  Choose whole grains, beans, pasta, potatoes, and cereals.    Use only small amounts of olive, corn, or canola oils.   Avoid butter, mayonnaise, shortening, or palm kernel oils.  Avoid foods with trans fats.   Drink skim or nonfat milk and eat low-fat or nonfat yogurt and cheeses. Avoid whole milk, cream, ice cream, egg yolks, and full-fat cheeses.   Healthy desserts include angel food cake, ginger snaps, animal crackers, hard candy, popsicles, and low-fat or nonfat frozen yogurt. Avoid pastries, cakes, pies, and cookies.   Exercise. Follow your exercise programs as directed by your health care provider.   A regular program helps decrease LDL and raise HDL.   A regular program helps with weight control.   Do things that increase your activity level like gardening, walking, or taking the stairs. Ask your health care provider about how you can be more active in your daily life.   Medicine. Take medicine only as directed by your health care provider.   Medicine may be prescribed by your health care provider to help lower cholesterol and decrease the risk for heart disease.   If you have several risk factors, you may need medicine even if your levels are normal.   This information is not intended to replace advice given to you by your health care provider. Make sure you discuss any questions you have with your health care provider.   Document Released: 11/02/2000 Document Revised: 02/28/2014 Document Reviewed: 11/21/2012 Elsevier Interactive Patient Education 2016  [employer].

## 2015-09-01 NOTE — Progress Notes (Signed)
Patient ID: George Cooper                 DOB: 03-Jul-1955                    MRN: [id number]     HPI: George HammersWilliam L Plasencia is a 60 y.o. male patient referred to lipid clinic by Dr. Allyson [facility]alBerry [facility]. Patient reports that he does not like taking pills.   Current Medications: none for cholesterol  Intolerances:  Atorvastatin 40mg  - He reports he did not like the way it made him feel. He would not really go into more detail about this. When questioned if this was muscle related he said it could have been but he wasn't sure.  Simvastatin 20mg  - similar reaction to atorvastatin  Risk Factors: PAD  LDL goal: <70  Diet: He eats most of his meals at home. He has been grilling all of his meats. He states his diet consists mostly of vegetables. He reports when he does eat meat it is pork or chicken. He eats steak about once a month.   Exercise: He does not do formal exercise. 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   Family History: Father had 2 strokes though he believes his father's cholesterol was good. He also believes his paternal grandfather had strokes. His mother had issues with cholesterol but died when she was 6194. He as a sister who also has a stent and atrial flutter. He is unsure if his 2 brothers have any cardiac history.   Social History: He quit smoking 3 years ago in Oct 2014. He has never used smokeless tobacco and denies alcohol.   Labs:  TC 228  TG 99  HDL 35  LDL 173  Past Medical History  Diagnosis Date  . Dizziness   . Carotid artery disease (HCC)   . Claudication (HCC)     right calf claudication  . Lyme disease 2006    "after bit by a tick" (09/18/2012)  . Arthritis     "right knee" (09/18/2012)  . History of gout   . Hyperlipidemia   . Tobacco abuse     discontinued October 2014  . Renal artery stenosis [facility](HCC)     Current Outpatient Prescriptions on File Prior to  Visit  Medication Sig Dispense Refill  . aspirin EC 325 MG EC tablet Take 1 tablet (325 mg total) by mouth daily. 30 tablet   . clopidogrel (PLAVIX) 75 MG tablet TAKE 1 TABLET WITH BREAKFAST. 30 tablet 6  . tamsulosin (FLOMAX) 0.4 MG CAPS capsule Take 0.4 mg by mouth daily.     No current facility-administered medications on file prior to visit.    No Known Allergies  Assessment/Plan: Hyperlipidemia: LDL is not at goal. Explained that ideally we would have him on low dose statin and PCSK9i. He is unwilling to go on another statin medication at this time. He currently does not have insurance. Therefore, we will submit his information to the drug company for coverage of Repatha.    Thank you, Freddie ApleyKelley M. Cleatis PolkaAuten, PharmD  [facility]  09/01/2015 8:40 AM

## 2015-09-04 ENCOUNTER — Other Ambulatory Visit: Payer: Self-pay | Admitting: *Deleted

## 2015-09-04 DIAGNOSIS — I779 Disorder of arteries and arterioles, unspecified: Secondary | ICD-10-CM

## 2015-09-04 DIAGNOSIS — I739 Peripheral vascular disease, unspecified: Principal | ICD-10-CM

## 2015-10-09 ENCOUNTER — Encounter: Payer: Self-pay | Admitting: Cardiovascular Disease

## 2015-10-09 ENCOUNTER — Telehealth: Payer: Self-pay | Admitting: Pharmacist

## 2015-10-09 ENCOUNTER — Ambulatory Visit (INDEPENDENT_AMBULATORY_CARE_PROVIDER_SITE_OTHER): Payer: Self-pay | Admitting: Cardiovascular Disease

## 2015-10-09 ENCOUNTER — Other Ambulatory Visit: Payer: Self-pay | Admitting: *Deleted

## 2015-10-09 VITALS — BP 105/54 | HR 63 | Ht 70.0 in | Wt 167.0 lb

## 2015-10-09 DIAGNOSIS — I739 Peripheral vascular disease, unspecified: Secondary | ICD-10-CM

## 2015-10-09 DIAGNOSIS — Z01818 Encounter for other preprocedural examination: Secondary | ICD-10-CM

## 2015-10-09 DIAGNOSIS — D689 Coagulation defect, unspecified: Secondary | ICD-10-CM

## 2015-10-09 NOTE — Progress Notes (Signed)
10/09/2015 George Cooper   July 29, 1955  [id number]  Primary Physician Lenise HeraldMANN, BENJAMIN, PA-C Primary Cardiologist: Runell GessJonathan J Berry MD Roseanne RenoFACP, FACC, FAHA, FSCAI  HPI:   [name] is a very pleasant 60 year old thin appearing married Caucasian male father of 2, grandfather of 2 grandchildren works as an Personnel officerelectrician. He was referred by Dr. Yetta NumbersBill McGough , at [facility], [place] for evaluation of dizziness and carotid artery disease. I last saw him in the office 08/14/2015.He denies chest pain or shortness of breath. He did complain however of right calf claudication. His cardiovascular risk profile is positive for a 20-pack-year history of tobacco abuse discontinued 10/14. He is not diabetic, hypertensive or hyperlipidemic. His mother did have bypass surgery in her 980s and his sister has had coronary stents as well. A stress test which was normal. Lower extremity arterial Dopplers revealed a high-frequency signal in the proximal segment of his right SFA. On 09/18/12 the patient underwent peripheral angiography, directional atherectomy, PTA and stenting of a high-grade segmental proximal right SFA stenosis. He had excellent angiographic and clinical result. His Doppler show a widely patent proximal right SFA and his claudication has resolved. His lower extremity arterial Doppler studies performed 05/14/13 revealed a widely patent stent with ABIs 1 bilaterally. He complains of mild right calf claudication with significant exertion. Since I saw him last he remained stable. He denies chest pain, shortness of breath . Lower extremity Dopplers performed 08/27/15 revealed a decline in his right ABI from one down to 0.91 with a new high-frequency signal in his distal right SFA.    Current Outpatient Prescriptions  Medication Sig Dispense Refill  . aspirin EC 325 MG EC tablet Take 1 tablet (325 mg total) by mouth daily. 30 tablet   . clopidogrel (PLAVIX) 75 MG tablet TAKE 1 TABLET WITH BREAKFAST. 30  tablet 6  . tadalafil (CIALIS) 5 MG tablet Take 5 mg by mouth daily as needed for erectile dysfunction.    . tamsulosin (FLOMAX) 0.4 MG CAPS capsule Take 0.4 mg by mouth daily.     No current facility-administered medications for this visit.     No Known Allergies  Social History   Social History  . Marital status: Married    Spouse name: N/A  . Number of children: N/A  . Years of education: N/A   Occupational History  . Not on file.   Social History Main Topics  . Smoking status: Former Smoker    Packs/day: 0.20    Years: 40.00    Types: Cigarettes    Quit date: 11/29/2012  . Smokeless tobacco: Never Used     Comment: trying to quit, smokes 2-3 daily  . Alcohol use No  . Drug use:     Types: Marijuana     Comment: 09/18/2012 "quit smoking pot 4-5 yr ago"  . Sexual activity: Not on file   Other Topics Concern  . Not on file   Social History Narrative  . No narrative on file     Review of Systems: General: negative for chills, fever, night sweats or weight changes.  Cardiovascular: negative for chest pain, dyspnea on exertion, edema, orthopnea, palpitations, paroxysmal nocturnal dyspnea or shortness of breath Dermatological: negative for rash Respiratory: negative for cough or wheezing Urologic: negative for hematuria Abdominal: negative for nausea, vomiting, diarrhea, bright red blood per rectum, melena, or hematemesis Neurologic: negative for visual changes, syncope, or dizziness All other systems reviewed and are otherwise negative except as noted above.  Blood pressure (!) 105/54, pulse 63, height 5\' 10"  (1.778 m), weight 167 lb (75.8 kg).  General appearance: alert and no distress Neck: no adenopathy, no JVD, supple, symmetrical, trachea midline, thyroid not enlarged, symmetric, no tenderness/mass/nodules and Soft bilateral carotid bruits Lungs: clear to auscultation bilaterally Heart: regular rate and rhythm, S1, S2 normal, no murmur, click, rub or  gallop Extremities: extremities normal, atraumatic, no cyanosis or edema  EKG not performed today  ASSESSMENT AND PLAN:   PAD (peripheral artery disease), s/p athrectomy, PTA/ stent Rt. SFA  09/18/2012 History of peripheral arterial disease status post right SFA PTA and stenting by myself 09/18/12. He had three-vessel bilaterally. Separate, claudication with recent Dopplers that showed decline in his right ABI from one duct 0.91 with a new high-frequency signal in his distal right SFA. Based on this, we decided to proceed with angiography and potential intervention.      Runell GessJonathan J. Berry MD FACP,FACC,FAHA, [facility] 10/09/2015 2:09 PM

## 2015-10-09 NOTE — Telephone Encounter (Signed)
Pt here for visit with Dr. Allyson SabalBerry.   His Repatha arrived yesterday so he was needing direction for injection. He demonstrated proper technique while giving first injection in office today.   Order entered for labs prior to 5th dose.   He has been approved for coverage through 02/21/16

## 2015-10-09 NOTE — Patient Instructions (Signed)
Medication Instructions:  Your physician recommends that you continue on your current medications as directed. Please refer to the Current Medication list given to you today.   Testing/Procedures: Dr. Berry has ordered a peripheral angiogram to be done at [facility].  This procedure is going to look at the bloodflow in your lower extremities.  If Dr. Berry is able to open up the arteries, you will have to spend one night in the hospital.  If he is not able to open the arteries, you will be able to go home that same day.    After the procedure, you will not be allowed to drive for 3 days or push, pull, or lift anything greater than 10 lbs for one week.    You will be required to have the following tests prior to the procedure:  1. Blood work-the blood work can be done no more than 14 days prior to the procedure.  It can be done at any [facility] lab.  There is one downstairs on the first floor of this building and one in the [facility] building ([address], [address])  2. Chest Xray-the chest xray order has already been placed at the [facility].     *REPS  SCOTT  Puncture site LEFT GROIN   If you need a refill on your cardiac medications before your next appointment, please call your pharmacy.   

## 2015-10-09 NOTE — Assessment & Plan Note (Signed)
History of peripheral arterial disease status post right SFA PTA and stenting by myself 09/18/12. He had three-vessel bilaterally. Separate, claudication with recent Dopplers that showed decline in his right ABI from one duct 0.91 with a new high-frequency signal in his distal right SFA. Based on this, we decided to proceed with angiography and potential intervention.

## 2015-10-14 ENCOUNTER — Ambulatory Visit (HOSPITAL_COMMUNITY)
Admission: RE | Admit: 2015-10-14 | Discharge: 2015-10-14 | Disposition: A | Discharge status: Home / Self Care | Payer: Self-pay | Source: Ambulatory Visit | Attending: Cardiovascular Disease | Admitting: Cardiovascular Disease

## 2015-10-14 DIAGNOSIS — I739 Peripheral vascular disease, unspecified: Secondary | ICD-10-CM | POA: Insufficient documentation

## 2015-10-14 DIAGNOSIS — J449 Chronic obstructive pulmonary disease, unspecified: Secondary | ICD-10-CM | POA: Insufficient documentation

## 2015-10-14 DIAGNOSIS — D689 Coagulation defect, unspecified: Secondary | ICD-10-CM | POA: Insufficient documentation

## 2015-10-14 DIAGNOSIS — Z01818 Encounter for other preprocedural examination: Secondary | ICD-10-CM | POA: Insufficient documentation

## 2015-10-14 DIAGNOSIS — I700 Atherosclerosis of aorta: Secondary | ICD-10-CM | POA: Insufficient documentation

## 2015-10-14 DIAGNOSIS — Z87891 Personal history of nicotine dependence: Secondary | ICD-10-CM | POA: Insufficient documentation

## 2015-11-03 ENCOUNTER — Other Ambulatory Visit: Payer: Self-pay | Admitting: Cardiovascular Disease

## 2015-11-04 ENCOUNTER — Telehealth: Payer: Self-pay | Admitting: *Deleted

## 2015-11-04 DIAGNOSIS — N289 Disorder of kidney and ureter, unspecified: Secondary | ICD-10-CM

## 2015-11-04 LAB — CBC WITH DIFFERENTIAL/PLATELET
BASOS: 1 %
Basophils Absolute: 0 10*3/uL (ref 0.0–0.2)
EOS (ABSOLUTE): 0.2 10*3/uL (ref 0.0–0.4)
EOS: 2 %
HEMATOCRIT: 38.6 % (ref 37.5–51.0)
Hemoglobin: 13.3 g/dL (ref 12.6–17.7)
Immature Grans (Abs): 0 10*3/uL (ref 0.0–0.1)
Immature Granulocytes: 0 %
Lymphocytes Absolute: 2.2 10*3/uL (ref 0.7–3.1)
Lymphs: 27 %
MCH: 28.9 pg (ref 26.6–33.0)
MCHC: 34.5 g/dL (ref 31.5–35.7)
MCV: 84 fL (ref 79–97)
MONOS ABS: 0.5 10*3/uL (ref 0.1–0.9)
Monocytes: 6 %
Neutrophils Absolute: 5.1 10*3/uL (ref 1.4–7.0)
Neutrophils: 64 %
Platelets: 218 10*3/uL (ref 150–379)
RBC: 4.6 x10E6/uL (ref 4.14–5.80)
RDW: 13.9 % (ref 12.3–15.4)
WBC: 8 10*3/uL (ref 3.4–10.8)

## 2015-11-04 LAB — BASIC METABOLIC PANEL
BUN/Creatinine Ratio: 10 (ref 10–24)
BUN: 14 mg/dL (ref 8–27)
CALCIUM: 9 mg/dL (ref 8.6–10.2)
CO2: 21 mmol/L (ref 18–29)
Chloride: 104 mmol/L (ref 96–106)
Creatinine, Ser: 1.44 mg/dL — ABNORMAL HIGH (ref 0.76–1.27)
GFR calc Af Amer: 60 mL/min/{1.73_m2} (ref >59)
GFR, EST NON AFRICAN AMERICAN: 52 mL/min/{1.73_m2} — AB (ref >59)
GLUCOSE: 89 mg/dL (ref 65–99)
POTASSIUM: 4.5 mmol/L (ref 3.5–5.2)
SODIUM: 142 mmol/L (ref 134–144)

## 2015-11-04 LAB — PROTIME-INR
INR: 1 (ref 0.8–1.2)
PROTHROMBIN TIME: 10.4 s (ref 9.1–12.0)

## 2015-11-04 LAB — TSH: TSH: 1.37 u[IU]/mL (ref 0.450–4.500)

## 2015-11-04 LAB — APTT: aPTT: 29 s (ref 24–33)

## 2015-11-04 NOTE — Telephone Encounter (Signed)
-----   Message from Runell GessJonathan J Berry, MD sent at 11/04/2015 11:30 AM EDT ----- His serum creatinine today is 1.44. I'm going to encourage by mouth fluid intake today and tomorrow I will recheck a stat team at Friday morning. If the serum creatinine remains elevated on Friday he will need to be admitted on Sunday for hydration prior to his PDA and 2 g on Monday morning.

## 2015-11-04 NOTE — Telephone Encounter (Signed)
Advised patient of lab results and patient will go Friday am for STAT repeat BMET

## 2015-11-05 NOTE — Telephone Encounter (Signed)
Confirmed with [facility] labs orders viewable.

## 2015-11-06 LAB — BASIC METABOLIC PANEL
BUN/Creatinine Ratio: 13 (ref 10–24)
BUN: 16 mg/dL (ref 8–27)
CALCIUM: 9.4 mg/dL (ref 8.6–10.2)
CHLORIDE: 106 mmol/L (ref 96–106)
CO2: 24 mmol/L (ref 18–29)
CREATININE: 1.21 mg/dL (ref 0.76–1.27)
GFR calc Af Amer: 74 mL/min/{1.73_m2} (ref >59)
GFR calc non Af Amer: 64 mL/min/{1.73_m2} (ref >59)
GLUCOSE: 124 mg/dL — AB (ref 65–99)
Potassium: 4.4 mmol/L (ref 3.5–5.2)
Sodium: 138 mmol/L (ref 134–144)

## 2015-11-06 NOTE — Telephone Encounter (Signed)
Advised patient of lab results   Notes Recorded by Runell GessJonathan J Berry, MD on 11/06/2015 at 2:15 PM EDT Call and tell normal

## 2015-11-09 ENCOUNTER — Encounter (HOSPITAL_COMMUNITY): Payer: Self-pay | Admitting: Cardiovascular Disease

## 2015-11-09 ENCOUNTER — Ambulatory Visit (HOSPITAL_COMMUNITY)
Admission: RE | Admit: 2015-11-09 | Discharge: 2015-11-10 | Disposition: A | Discharge status: Home / Self Care | Payer: Self-pay | Source: Ambulatory Visit | Attending: Cardiovascular Disease | Admitting: Cardiovascular Disease

## 2015-11-09 ENCOUNTER — Encounter (HOSPITAL_COMMUNITY)
Admission: RE | Disposition: A | Discharge status: Home / Self Care | Payer: Self-pay | Source: Ambulatory Visit | Attending: Cardiovascular Disease

## 2015-11-09 DIAGNOSIS — I70213 Atherosclerosis of native arteries of extremities with intermittent claudication, bilateral legs: Secondary | ICD-10-CM | POA: Insufficient documentation

## 2015-11-09 DIAGNOSIS — I739 Peripheral vascular disease, unspecified: Secondary | ICD-10-CM | POA: Diagnosis present

## 2015-11-09 DIAGNOSIS — Z7982 Long term (current) use of aspirin: Secondary | ICD-10-CM | POA: Insufficient documentation

## 2015-11-09 DIAGNOSIS — Z87891 Personal history of nicotine dependence: Secondary | ICD-10-CM | POA: Insufficient documentation

## 2015-11-09 DIAGNOSIS — I6529 Occlusion and stenosis of unspecified carotid artery: Secondary | ICD-10-CM | POA: Insufficient documentation

## 2015-11-09 DIAGNOSIS — I70211 Atherosclerosis of native arteries of extremities with intermittent claudication, right leg: Secondary | ICD-10-CM

## 2015-11-09 DIAGNOSIS — Z7902 Long term (current) use of antithrombotics/antiplatelets: Secondary | ICD-10-CM | POA: Insufficient documentation

## 2015-11-09 DIAGNOSIS — Z8249 Family history of ischemic heart disease and other diseases of the circulatory system: Secondary | ICD-10-CM | POA: Insufficient documentation

## 2015-11-09 HISTORY — PX: PERIPHERAL VASCULAR CATHETERIZATION: SHX172C

## 2015-11-09 LAB — POCT ACTIVATED CLOTTING TIME
ACTIVATED CLOTTING TIME: 241 s
ACTIVATED CLOTTING TIME: 180 s
ACTIVATED CLOTTING TIME: 213 s

## 2015-11-09 SURGERY — ABDOMINAL AORTOGRAM W/LOWER EXTREMITY
Laterality: Right

## 2015-11-09 MED ORDER — CLOPIDOGREL BISULFATE 75 MG PO TABS: 75.0000 mg | ORAL_TABLET | Freq: Once | ORAL | Status: DC

## 2015-11-09 MED ORDER — SODIUM CHLORIDE 0.9 % IV SOLN: INTRAVENOUS | Status: AC

## 2015-11-09 MED ORDER — IBUPROFEN 200 MG PO TABS: 400.0000 mg | ORAL_TABLET | Freq: Four times a day (QID) | ORAL | Status: DC | PRN

## 2015-11-09 MED ORDER — SODIUM CHLORIDE 0.9 % WEIGHT BASED INFUSION: 1.0000 mL/kg/h | INTRAVENOUS | Status: DC

## 2015-11-09 MED ORDER — TAMSULOSIN HCL 0.4 MG PO CAPS
0.4000 mg | ORAL_CAPSULE | Freq: Every day | ORAL | Status: DC
  Administered 2015-11-09: 0.4 mg via ORAL
  Filled 2015-11-09: qty 1

## 2015-11-09 MED ORDER — HEPARIN (PORCINE) IN NACL 2-0.9 UNIT/ML-% IJ SOLN
INTRAMUSCULAR | Status: AC
  Filled 2015-11-09: qty 1000

## 2015-11-09 MED ORDER — MORPHINE SULFATE (PF) 2 MG/ML IV SOLN
INTRAVENOUS | Status: AC
  Filled 2015-11-09: qty 1

## 2015-11-09 MED ORDER — FENTANYL CITRATE (PF) 100 MCG/2ML IJ SOLN
INTRAMUSCULAR | Status: AC
  Filled 2015-11-09: qty 2

## 2015-11-09 MED ORDER — HEPARIN (PORCINE) IN NACL 2-0.9 UNIT/ML-% IJ SOLN
INTRAMUSCULAR | Status: DC | PRN
  Administered 2015-11-09: 1000 mL

## 2015-11-09 MED ORDER — MIDAZOLAM HCL 2 MG/2ML IJ SOLN
INTRAMUSCULAR | Status: DC | PRN
  Administered 2015-11-09 (×2): 1 mg via INTRAVENOUS

## 2015-11-09 MED ORDER — MORPHINE SULFATE (PF) 2 MG/ML IV SOLN
2.0000 mg | INTRAVENOUS | Status: DC | PRN
  Administered 2015-11-09: 2 mg via INTRAVENOUS

## 2015-11-09 MED ORDER — FENTANYL CITRATE (PF) 100 MCG/2ML IJ SOLN
INTRAMUSCULAR | Status: DC | PRN
  Administered 2015-11-09 (×2): 25 ug via INTRAVENOUS

## 2015-11-09 MED ORDER — HEPARIN SODIUM (PORCINE) 1000 UNIT/ML IJ SOLN
INTRAMUSCULAR | Status: DC | PRN
  Administered 2015-11-09: 7000 [IU] via INTRAVENOUS

## 2015-11-09 MED ORDER — CLOPIDOGREL BISULFATE 75 MG PO TABS
75.0000 mg | ORAL_TABLET | Freq: Every day | ORAL | Status: DC
  Administered 2015-11-10: 09:00:00 75 mg via ORAL
  Filled 2015-11-09: qty 1

## 2015-11-09 MED ORDER — SODIUM CHLORIDE 0.9% FLUSH: 3.0000 mL | INTRAVENOUS | Status: DC | PRN

## 2015-11-09 MED ORDER — ACETAMINOPHEN 325 MG PO TABS: 650.0000 mg | ORAL_TABLET | ORAL | Status: DC | PRN

## 2015-11-09 MED ORDER — HEPARIN SODIUM (PORCINE) 1000 UNIT/ML IJ SOLN
INTRAMUSCULAR | Status: AC
  Filled 2015-11-09: qty 1

## 2015-11-09 MED ORDER — SODIUM CHLORIDE 0.9 % WEIGHT BASED INFUSION
3.0000 mL/kg/h | INTRAVENOUS | Status: DC
  Administered 2015-11-09: 3 mL/kg/h via INTRAVENOUS

## 2015-11-09 MED ORDER — LIDOCAINE HCL (PF) 1 % IJ SOLN
INTRAMUSCULAR | Status: AC
  Filled 2015-11-09: qty 30

## 2015-11-09 MED ORDER — HYDRALAZINE HCL 20 MG/ML IJ SOLN
10.0000 mg | INTRAMUSCULAR | Status: DC | PRN
  Administered 2015-11-09 (×2): 10 mg via INTRAVENOUS
  Filled 2015-11-09: qty 1

## 2015-11-09 MED ORDER — ASPIRIN 81 MG PO CHEW: 81.0000 mg | CHEWABLE_TABLET | ORAL | Status: DC

## 2015-11-09 MED ORDER — IODIXANOL 320 MG/ML IV SOLN
INTRAVENOUS | Status: DC | PRN
  Administered 2015-11-09: 140 mL via INTRA_ARTERIAL

## 2015-11-09 MED ORDER — ASPIRIN EC 325 MG PO TBEC
325.0000 mg | DELAYED_RELEASE_TABLET | Freq: Every day | ORAL | Status: DC
  Administered 2015-11-10: 325 mg via ORAL
  Filled 2015-11-09: qty 1

## 2015-11-09 MED ORDER — TADALAFIL 5 MG PO TABS: 5.0000 mg | ORAL_TABLET | Freq: Every day | ORAL | Status: DC | PRN

## 2015-11-09 MED ORDER — ASPIRIN EC 325 MG PO TBEC: 325.0000 mg | DELAYED_RELEASE_TABLET | Freq: Every day | ORAL | Status: DC

## 2015-11-09 MED ORDER — LIDOCAINE HCL (PF) 1 % IJ SOLN
INTRAMUSCULAR | Status: DC | PRN
  Administered 2015-11-09 (×2): 4 mL
  Administered 2015-11-09: 6 mL

## 2015-11-09 MED ORDER — MIDAZOLAM HCL 2 MG/2ML IJ SOLN
INTRAMUSCULAR | Status: AC
  Filled 2015-11-09: qty 2

## 2015-11-09 MED ORDER — ONDANSETRON HCL 4 MG/2ML IJ SOLN
4.0000 mg | Freq: Four times a day (QID) | INTRAMUSCULAR | Status: DC | PRN
  Administered 2015-11-09 (×2): 4 mg via INTRAVENOUS
  Filled 2015-11-09 (×2): qty 2

## 2015-11-09 MED ORDER — EVOLOCUMAB 140 MG/ML ~~LOC~~ SOSY: 140.0000 mg | PREFILLED_SYRINGE | SUBCUTANEOUS | Status: DC

## 2015-11-09 MED ORDER — HYDRALAZINE HCL 20 MG/ML IJ SOLN
INTRAMUSCULAR | Status: AC
  Filled 2015-11-09: qty 1

## 2015-11-09 SURGICAL SUPPLY — 20 items
BALLN IN.PACT DCB 5X60 (BALLOONS) ×4
CATH ANGIO 5F PIGTAIL 65CM (CATHETERS) ×2 IMPLANT
CATH CROSS OVER TEMPO 5F (CATHETERS) ×2 IMPLANT
CATH HAWKONE LS STANDARD TIP (CATHETERS) ×4
CATH HAWKONE LS STD TIP (CATHETERS) IMPLANT
DCB IN.PACT 5X60 (BALLOONS) IMPLANT
DEVICE SPIDERFX EMB PROT 6MM (WIRE) ×2 IMPLANT
KIT ENCORE 26 ADVANTAGE (KITS) ×2 IMPLANT
KIT MICROINTRODUCER STIFF 5F (SHEATH) ×2 IMPLANT
KIT PV (KITS) ×4 IMPLANT
SHEATH PINNACLE 5F 10CM (SHEATH) ×2 IMPLANT
SHEATH PINNACLE 7F 10CM (SHEATH) ×2 IMPLANT
SYRINGE MEDRAD AVANTA MACH 7 (SYRINGE) ×2 IMPLANT
TAPE VIPERTRACK RADIOPAQ (MISCELLANEOUS) IMPLANT
TAPE VIPERTRACK RADIOPAQUE (MISCELLANEOUS) ×4
TRANSDUCER W/STOPCOCK (MISCELLANEOUS) ×2 IMPLANT
TRAY PV CATH (CUSTOM PROCEDURE TRAY) ×4 IMPLANT
TUBING CIL FLEX 10 FLL-RA (TUBING) ×2 IMPLANT
WIRE HITORQ VERSACORE ST 145CM (WIRE) ×2 IMPLANT
WIRE SPARTACORE .014X300CM (WIRE) ×2 IMPLANT

## 2015-11-09 NOTE — Progress Notes (Signed)
Site area: left groin fa sheath Site Prior to Removal:  Level 0 Pressure Applied For:  30 minutes Manual:   yes Patient Status During Pull:  stable Post Pull Site:  Level  0 Post Pull Instructions Given:  yes Post Pull Pulses Present: yes Dressing Applied:  tegaderm Bedrest begins @ 1420 Comments:

## 2015-11-09 NOTE — Care Management Note (Signed)
Case Management Note  Patient Details  Name: George Cooper MRN: [id number] Date of Birth: Jul 06, 1955  Subjective/Objective:   Patient s/p peripheral vascular balloon aghioplasty and atherectomy, will be on plavix and asa.  NCM will cont to follow for dc  Needs.                  Action/Plan:   Expected Discharge Date:                  Expected Discharge Plan:  Home/Self Care  In-House Referral:     Discharge planning Services  CM Consult  Post Acute Care Choice:    Choice offered to:     DME Arranged:    DME Agency:     HH Arranged:    HH Agency:     Status of Service:  In process, will continue to follow  If discussed at Long Length of Stay Meetings, dates discussed:    Additional Comments:  Leone Havenaylor, Kerrick Miler Clinton, RN 11/09/2015, 4:21 PM

## 2015-11-09 NOTE — H&P (Signed)
11/09/15 George Cooper   20-May-1955  [id number]  Primary Physician Lenise HeraldMANN, BENJAMIN, PA-C Primary Cardiologist: Runell GessJonathan J Berry MD Roseanne RenoFACP, FACC, FAHA, FSCAI  HPI:  [name] is a very pleasant 60 year old thin appearing married Caucasian male father of 2, grandfather of 2 grandchildren works as an Personnel officerelectrician. He was referred by Dr. Yetta NumbersBill McGough , at [facility], in [place] for evaluation of dizziness and carotid artery disease. He was last seen by Dr. Allyson SabalBerry on 10/09/15. At that time, he denied chest pain or shortness of breath. He did complain however of right calf claudication. His cardiovascular risk profile is positive for a 20-pack-year history of tobacco abuse discontinued 10/14. He is not diabetic, hypertensive or hyperlipidemic. His mother did have bypass surgery in her 3980s and his sister has had coronary stents as well.A stress test which was normal. Lower extremity arterial Dopplers revealed a high-frequency signal in the proximal segment of his right SFA. On 09/18/12 the patient underwent peripheral angiography, directional atherectomy, PTA and stenting of a high-grade segmental proximal right SFA stenosis. He had excellent angiographic and clinical result. His Doppler show a widely patent proximal right SFA and his claudication has resolved. His lower extremity arterial Doppler studies performed 05/14/13 revealed a widely patent stent with ABIs 1 bilaterally. He complains of mild right calf claudication with significant exertion. Since I saw him last he remained stable. He denies chest pain, shortness of breath . Lower extremity Dopplers performed 08/27/15 revealed a decline in his right ABI from one down to 0.91 with a new high-frequency signal in his distal right SFA.    Current Outpatient Prescriptions  Medication Sig Dispense Refill  . aspirin EC 325 MG EC tablet Take 1 tablet (325 mg total) by mouth daily. 30 tablet   . clopidogrel (PLAVIX) 75 MG tablet TAKE 1  TABLET WITH BREAKFAST. 30 tablet 6  . tadalafil (CIALIS) 5 MG tablet Take 5 mg by mouth daily as needed for erectile dysfunction.    . tamsulosin (FLOMAX) 0.4 MG CAPS capsule Take 0.4 mg by mouth daily.     No current facility-administered medications for this visit.     No Known Allergies  Social History        Social History  . Marital status: Married    Spouse name: N/A  . Number of children: N/A  . Years of education: N/A      Occupational History  . Not on file.         Social History Main Topics  . Smoking status: Former Smoker    Packs/day: 0.20    Years: 40.00    Types: Cigarettes    Quit date: 11/29/2012  . Smokeless tobacco: Never Used     Comment: trying to quit, smokes 2-3 daily  . Alcohol use No  . Drug use:     Types: Marijuana     Comment: 09/18/2012 "quit smoking pot 4-5 yr ago"  . Sexual activity: Not on file       Other Topics Concern  . Not on file      Social History Narrative  . No narrative on file     Review of Systems: General: negative for chills, fever, night sweats or weight changes.  Cardiovascular: negative for chest pain, dyspnea on exertion, edema, orthopnea, palpitations, paroxysmal nocturnal dyspnea or shortness of breath Dermatological: negative for rash Respiratory: negative for cough or wheezing Urologic: negative for hematuria Abdominal: negative for nausea, vomiting, diarrhea, bright red blood per rectum,  melena, or hematemesis Neurologic: negative for visual changes, syncope, or dizziness All other systems reviewed and are otherwise negative except as noted above.   Physical Exam: Temp:  [97.9 F (36.6 C)] 97.9 F (36.6 C) (09/18 0938) Pulse Rate:  [54] 54 (09/18 0938) Resp:  [16] 16 (09/18 0938) BP: (135)/(74) 135/74 (09/18 0938) SpO2:  [99 %-100 %] 99 % (09/18 1125) Weight:  [75.8 kg (167 lb)] 75.8 kg (167 lb) (09/18 0938) General appearance: alert and no distress Neck: no  adenopathy, no JVD, supple, symmetrical, trachea midline, thyroid not enlarged, symmetric, no tenderness/mass/nodules and Soft bilateral carotid bruits Lungs: clear to auscultation bilaterally Heart: regular rate and rhythm, S1, S2 normal, no murmur, click, rub or gallop Extremities: extremities normal, atraumatic, no cyanosis or edema  ASSESSMENT AND PLAN:   PAD (peripheral artery disease), s/p athrectomy, PTA/ stent Rt. SFA  09/18/2012 History of peripheral arterial disease status post right SFA PTA and stenting by myself 09/18/12. He had three-vessel bilaterally. Separate, claudication with recent Dopplers that showed decline in his right ABI from one duct 0.91 with a new high-frequency signal in his distal right SFA. He reports claudication in the right calf when walking less than 50 feet.  Based on this, we decided to proceed with angiography and potential intervention.  Yvonne Kendall, MD [facility] Pager: [phone]

## 2015-11-10 ENCOUNTER — Other Ambulatory Visit: Payer: Self-pay | Admitting: Cardiology

## 2015-11-10 ENCOUNTER — Encounter (HOSPITAL_COMMUNITY): Payer: Self-pay | Admitting: Cardiology

## 2015-11-10 ENCOUNTER — Telehealth: Payer: Self-pay | Admitting: Cardiovascular Disease

## 2015-11-10 DIAGNOSIS — I739 Peripheral vascular disease, unspecified: Secondary | ICD-10-CM

## 2015-11-10 LAB — BASIC METABOLIC PANEL
Anion gap: 8 (ref 5–15)
BUN: 11 mg/dL (ref 6–20)
CO2: 25 mmol/L (ref 22–32)
Calcium: 9.2 mg/dL (ref 8.9–10.3)
Chloride: 108 mmol/L (ref 101–111)
Creatinine, Ser: 1.31 mg/dL — ABNORMAL HIGH (ref 0.61–1.24)
GFR calc Af Amer: >60 mL/min (ref >60)
GFR calc non Af Amer: 57 mL/min — ABNORMAL LOW (ref >60)
Glucose, Bld: 108 mg/dL — ABNORMAL HIGH (ref 65–99)
Potassium: 4 mmol/L (ref 3.5–5.1)
Sodium: 141 mmol/L (ref 135–145)

## 2015-11-10 LAB — CBC
HCT: 42 % (ref 39.0–52.0)
Hemoglobin: 13.9 g/dL (ref 13.0–17.0)
MCH: 28.8 pg (ref 26.0–34.0)
MCHC: 33.1 g/dL (ref 30.0–36.0)
MCV: 87 fL (ref 78.0–100.0)
Platelets: 202 10*3/uL (ref 150–400)
RBC: 4.83 MIL/uL (ref 4.22–5.81)
RDW: 13.3 % (ref 11.5–15.5)
WBC: 9.5 10*3/uL (ref 4.0–10.5)

## 2015-11-10 NOTE — Telephone Encounter (Signed)
Return to work on Monday.  

## 2015-11-10 NOTE — Telephone Encounter (Signed)
°  New Prob  Pt calling to verify when he is cleared to return to work. Please call.

## 2015-11-10 NOTE — Progress Notes (Signed)
Patient Name: George Cooper Date of Encounter: 11/10/2015  Hospital Problem List     Active Problems:   Claudication [facility])   PAD (peripheral artery disease), s/p athrectomy, PTA/ stent Rt. SFA  09/18/2012    Subjective   Feeling well this morning.   Inpatient Medications    . aspirin EC  325 mg Oral Daily  . clopidogrel  75 mg Oral Q breakfast  . tamsulosin  0.4 mg Oral QPC supper    Vital Signs    Vitals:   11/09/15 1830 11/09/15 1900 11/09/15 1918 11/10/15 0400  BP: 133/68 (!) 153/72 (!) 146/70 128/65  Pulse: 83 87 85 76  Resp: 18 (!) 21 13 17   Temp:   97.8 F (36.6 C) 97.9 F (36.6 C)  TempSrc:   Oral Oral  SpO2: 98% 96% 97% 95%  Weight:    168 lb 12.8 oz (76.6 kg)  Height:        Intake/Output Summary (Last 24 hours) at 11/10/15 0659 Last data filed at 11/10/15 0100  Gross per 24 hour  Intake           793.75 ml  Output             1000 ml  Net          -206.25 ml   Filed Weights   11/09/15 0938 11/10/15 0400  Weight: 167 lb (75.8 kg) 168 lb 12.8 oz (76.6 kg)    Physical Exam    General: Pleasant caucasian male, NAD. Neuro: Alert and oriented X 3. Moves all extremities spontaneously. Psych: Normal affect. HEENT:  Normal  Neck: Supple without bruits or JVD. Lungs:  Resp regular and unlabored, CTA. Heart: RRR no s3, s4, or murmurs. Abdomen: Soft, non-tender, non-distended, BS + x 4.  Extremities: No clubbing, cyanosis or edema. DP/PT/Radials 2+ and equal bilaterally. Left femoral site without bruising/hematoma  Labs    CBC  Recent Labs  11/10/15 0357  WBC 9.5  HGB 13.9  HCT 42.0  MCV 87.0  PLT 202   Basic Metabolic Panel  Recent Labs  11/10/15 0357  NA 141  K 4.0  CL 108  CO2 25  GLUCOSE 108*  BUN 11  CREATININE 1.31*  CALCIUM 9.2   Liver Function Tests No results for input(s): AST, ALT, ALKPHOS, BILITOT, PROT, ALBUMIN in the last 72 hours. No results for input(s): LIPASE, AMYLASE in the last 72 hours. Cardiac  Enzymes No results for input(s): CKTOTAL, CKMB, CKMBINDEX, TROPONINI in the last 72 hours. BNP Invalid input(s): POCBNP D-Dimer No results for input(s): DDIMER in the last 72 hours. Hemoglobin A1C No results for input(s): HGBA1C in the last 72 hours. Fasting Lipid Panel No results for input(s): CHOL, HDL, LDLCALC, TRIG, CHOLHDL, LDLDIRECT in the last 72 hours. Thyroid Function Tests No results for input(s): TSH, T4TOTAL, T3FREE, THYROIDAB in the last 72 hours.  Invalid input(s): FREET3  Telemetry    SR  ECG    N/A  Radiology    Dg Chest 2 View  Result Date: 10/14/2015 CLINICAL DATA:  Preoperative exam prior to peripheral vascular stent placement. History of claudication, clotting disorder. The patient reports several years of mild eggs shortness of breath on exertion. Discontinued smoking 3 years ago. EXAM: CHEST  2 VIEW COMPARISON:  PA chest x-ray dated September 18, 2012 FINDINGS: The lungs are hyperinflated but clear. The heart and pulmonary vascularity are normal. The mediastinum is normal in width. There is calcification in the wall of the thoracic  aorta. The bony thorax exhibits no acute abnormality. IMPRESSION: Mild hyperinflation consistent with the patient's smoking history and COPD. No pneumonia nor CHF. Aortic atherosclerosis. Electronically Signed   By: David  SwazilandJordan M.D.   On: 10/14/2015 14:58    Assessment & Plan    60 yo male last seen by Dr. Allyson SabalBerry in the office on 10/09/15 with reports of right calf claudication. Lower extremity Dopplers performed 08/27/15 revealed a decline in his right ABI from one down to 0.91 with a new high-frequency signal in his distal right SFA. He presented on 11/09/15 for lower extremity angiography.   1. PAD: Stenting in 09/18/12 showed three vessel diease bilaterally. Dopplers showed decline in his right ABI from one duct 0.91 with a new high-frequency signal in his distal right SFA. He reported claudication in the right calf when walking less than  50 feet. He underwent successful directional atherectomy and DES of the mid to distal right SFA. He was kept overnight and hydrated. Plan to continue with ASA and Plavix. [facility] and f/u in 2-3 weeks.   Signed, Laverda PageLindsay Roberts NP-C Pager [phone]   I have seen and examined the patient along with Laverda PageLindsay Roberts NP-C.  I have reviewed the chart, notes and new data.  I agree with PA/NP's note.  Key new complaints: feels great Key examination changes: no hematoma/ecchymosis at cath access site Key new findings / data: creat 1.3  PLAN: DC home with f/u eval per Br. Berry's note.  Thurmon FairMihai Lillieann Pavlich, MD, [facility] [phone] 11/10/2015, 9:27 AM

## 2015-11-10 NOTE — Discharge Summary (Signed)
Discharge Summary    Patient ID: George Cooper,  MRN: [id number], DOB/AGE: 1955-05-25 60 y.o.  Admit date: 11/09/2015 Discharge date: 11/10/2015  Primary Care Provider: Lenise Herald Primary Cardiologist: Dr. Allyson Sabal  Discharge Diagnoses    Active Problems:   Claudication [facility])   PAD (peripheral artery disease), s/p athrectomy, PTA/ stent Rt. SFA  09/18/2012   Allergies No Known Allergies  Diagnostic Studies/Procedures    PV cath: 11/09/15  Successful Hawk 1 directional atherectomy and drug-eluting blast and plasty of a mid to distal focal right SFA stenosis using spider distal protection for lifestyle limiting claudication. There was a small linear dissection that did not appear to be flow-limiting and a small perforation that entered the venous phase which I suspect will heal once the ACT returns to normal. The patient will be hydrated for the next 12 hours, treated with aspirin and Plavix and discharged home in the morning.  _____________   History of Present Illness     60 yo male last seen by Dr. Allyson Sabal in the office on 10/09/15 with reports of right calf claudication. Lower extremity Dopplers performed 08/27/15 revealed a decline in his right ABI from one down to 0.91 with a new high-frequency signal in his distal right SFA. He presented on 11/09/15 for lower extremity angiography.   Hospital Course     Consultants: None  He underwent successful directional atherectomy and DES of the mid to distal right SFA. He was kept overnight and hydrated. Plan to continue with ASA and Plavix. He was observed overnight without any complications. Reported feeling well the following morning.   He was seen and assessed by Dr. Royann Shivers on 9/19 and determined stable for discharge home. I have arranged for follow up dopplers next week and a f/u with Dr. Allyson Sabal in 3 weeks. _____________  Discharge Vitals Blood pressure (!) 124/49, pulse 66, temperature 98.2 F (36.8 C), temperature source  Oral, resp. rate 19, height 5\' 10"  (1.778 m), weight 168 lb 12.8 oz (76.6 kg), SpO2 96 %.  Filed Weights   11/09/15 0938 11/10/15 0400  Weight: 167 lb (75.8 kg) 168 lb 12.8 oz (76.6 kg)    Labs & Radiologic Studies    CBC  Recent Labs  11/10/15 0357  WBC 9.5  HGB 13.9  HCT 42.0  MCV 87.0  PLT 202   Basic Metabolic Panel  Recent Labs  11/10/15 0357  NA 141  K 4.0  CL 108  CO2 25  GLUCOSE 108*  BUN 11  CREATININE 1.31*  CALCIUM 9.2   Liver Function Tests No results for input(s): AST, ALT, ALKPHOS, BILITOT, PROT, ALBUMIN in the last 72 hours. No results for input(s): LIPASE, AMYLASE in the last 72 hours. Cardiac Enzymes No results for input(s): CKTOTAL, CKMB, CKMBINDEX, TROPONINI in the last 72 hours. BNP Invalid input(s): POCBNP D-Dimer No results for input(s): DDIMER in the last 72 hours. Hemoglobin A1C No results for input(s): HGBA1C in the last 72 hours. Fasting Lipid Panel No results for input(s): CHOL, HDL, LDLCALC, TRIG, CHOLHDL, LDLDIRECT in the last 72 hours. Thyroid Function Tests No results for input(s): TSH, T4TOTAL, T3FREE, THYROIDAB in the last 72 hours.  Invalid input(s): FREET3 _____________  Dg Chest 2 View  Result Date: 10/14/2015 CLINICAL DATA:  Preoperative exam prior to peripheral vascular stent placement. History of claudication, clotting disorder. The patient reports several years of mild eggs shortness of breath on exertion. Discontinued smoking 3 years ago. EXAM: CHEST  2 VIEW COMPARISON:  PA chest x-ray dated September 18, 2012 FINDINGS: The lungs are hyperinflated but clear. The heart and pulmonary vascularity are normal. The mediastinum is normal in width. There is calcification in the wall of the thoracic aorta. The bony thorax exhibits no acute abnormality. IMPRESSION: Mild hyperinflation consistent with the patient's smoking history and COPD. No pneumonia nor CHF. Aortic atherosclerosis. Electronically Signed   By: David  [place]Jordan M.D.   On:  10/14/2015 14:58   Disposition   Pt is being discharged home today in good condition.  Follow-up Plans & Appointments    Follow-up Information    Nanetta BattyBerry, Jonathan, MD Follow up on 11/27/2015.   Specialties:  Cardiology, Radiology Why:  at 10:45am for your follow up.  Contact information: [address] [place] [address] [phone]        Heart Care Follow up on 11/19/2015.   Why:  at 845 for your follow up dopplers Contact information: [facility] [address] [place] [address]         Discharge Instructions    Diet - low sodium heart healthy    Complete by:  As directed    Discharge instructions    Complete by:  As directed    Groin Site Care Refer to this sheet in the next few weeks. These instructions provide you with information on caring for yourself after your procedure. Your caregiver may also give you more specific instructions. Your treatment has been planned according to current medical practices, but problems sometimes occur. Call your caregiver if you have any problems or questions after your procedure. HOME CARE INSTRUCTIONS You may shower 24 hours after the procedure. Remove the bandage (dressing) and gently wash the site with plain soap and water. Gently pat the site dry.  Do not apply powder or lotion to the site.  Do not sit in a bathtub, swimming pool, or whirlpool for 5 to 7 days.  No bending, squatting, or lifting anything over 10 pounds (4.5 kg) as directed by your caregiver.  Inspect the site at least twice daily.  Do not drive home if you are discharged the same day of the procedure. Have someone else drive you.  You may drive 24 hours after the procedure unless otherwise instructed by your caregiver.  What to expect: Any bruising will usually fade within 1 to 2 weeks.  Blood that collects in the tissue (hematoma) may be painful to the touch. It should usually decrease in size and tenderness  within 1 to 2 weeks.  SEEK IMMEDIATE MEDICAL CARE IF: You have unusual pain at the groin site or down the affected leg.  You have redness, warmth, swelling, or pain at the groin site.  You have drainage (other than a small amount of blood on the dressing).  You have chills.  You have a fever or persistent symptoms for more than 72 hours.  You have a fever and your symptoms suddenly get worse.  Your leg becomes pale, cool, tingly, or numb.  You have heavy bleeding from the site. Hold pressure on the site. .   Increase activity slowly    Complete by:  As directed       Discharge Medications   Current Discharge Medication List    CONTINUE these medications which have NOT CHANGED   Details  aspirin EC 325 MG EC tablet Take 1 tablet (325 mg total) by mouth daily. Qty: 30 tablet    clopidogrel (PLAVIX) 75 MG tablet TAKE 1  TABLET WITH BREAKFAST. Qty: 30 tablet, Refills: 6    Evolocumab (REPATHA) 140 MG/ML SOSY Inject 140 mg into the skin every 14 (fourteen) days. Last dose was 10-30-15    ibuprofen (ADVIL,MOTRIN) 200 MG tablet Take 400 mg by mouth every 6 (six) hours as needed for mild pain.    tadalafil (CIALIS) 5 MG tablet Take 5 mg by mouth daily as needed for erectile dysfunction.    tamsulosin (FLOMAX) 0.4 MG CAPS capsule Take 0.4 mg by mouth daily after supper.          Outstanding Labs/Studies   None  Duration of Discharge Encounter   Greater than 30 minutes including physician time.  Signed, Laverda Page NP-C 11/10/2015, 10:06 AM

## 2015-11-10 NOTE — Progress Notes (Signed)
Offered assistance with bath, Pt declined at this time.

## 2015-11-10 NOTE — Care Management Note (Signed)
Case Management Note  Patient Details  Name: George Cooper MRN: [id number] Date of Birth: 1955-12-19  Subjective/Objective:   Patient s/p peripheral vascular balloon aghioplasty and atherectomy, will be on plavix,  and asa   For dc today,no needs.             Action/Plan:   Expected Discharge Date:                  Expected Discharge Plan:  Home/Self Care  In-House Referral:     Discharge planning Services  CM Consult  Post Acute Care Choice:    Choice offered to:     DME Arranged:    DME Agency:     HH Arranged:    HH Agency:     Status of Service:  Completed, signed off  If discussed at [employer] Length of Stay Meetings, dates discussed:    Additional Comments:  Leone Havenaylor, Brit Carbonell Clinton, RN 11/10/2015, 11:18 AM

## 2015-11-11 NOTE — Telephone Encounter (Signed)
Returned call to patient and let him know he can return to work on Monday. He said he was doing well. He verbalized understanding.

## 2015-11-16 ENCOUNTER — Emergency Department (HOSPITAL_COMMUNITY): Payer: Self-pay

## 2015-11-16 ENCOUNTER — Other Ambulatory Visit: Payer: Self-pay | Admitting: Cardiovascular Disease

## 2015-11-16 ENCOUNTER — Other Ambulatory Visit: Payer: Self-pay | Admitting: Cardiology

## 2015-11-16 ENCOUNTER — Encounter (HOSPITAL_COMMUNITY): Payer: Self-pay | Admitting: Emergency Medicine

## 2015-11-16 ENCOUNTER — Observation Stay (HOSPITAL_COMMUNITY)
Admission: EM | Admit: 2015-11-16 | Discharge: 2015-11-17 | Disposition: A | Discharge status: Home / Self Care | Payer: Self-pay | Attending: Internal Medicine | Admitting: Internal Medicine

## 2015-11-16 ENCOUNTER — Observation Stay (HOSPITAL_COMMUNITY): Payer: Self-pay

## 2015-11-16 DIAGNOSIS — G44019 Episodic cluster headache, not intractable: Secondary | ICD-10-CM

## 2015-11-16 DIAGNOSIS — Z79899 Other long term (current) drug therapy: Secondary | ICD-10-CM | POA: Insufficient documentation

## 2015-11-16 DIAGNOSIS — R51 Headache: Secondary | ICD-10-CM

## 2015-11-16 DIAGNOSIS — E785 Hyperlipidemia, unspecified: Secondary | ICD-10-CM | POA: Diagnosis present

## 2015-11-16 DIAGNOSIS — I639 Cerebral infarction, unspecified: Principal | ICD-10-CM | POA: Insufficient documentation

## 2015-11-16 DIAGNOSIS — I739 Peripheral vascular disease, unspecified: Secondary | ICD-10-CM

## 2015-11-16 DIAGNOSIS — R519 Headache, unspecified: Secondary | ICD-10-CM

## 2015-11-16 DIAGNOSIS — Z791 Long term (current) use of non-steroidal anti-inflammatories (NSAID): Secondary | ICD-10-CM | POA: Insufficient documentation

## 2015-11-16 DIAGNOSIS — M6289 Other specified disorders of muscle: Secondary | ICD-10-CM

## 2015-11-16 DIAGNOSIS — R531 Weakness: Secondary | ICD-10-CM

## 2015-11-16 LAB — I-STAT CHEM 8, ED
BUN: 15 mg/dL (ref 6–20)
CALCIUM ION: 1.17 mmol/L (ref 1.15–1.40)
Chloride: 106 mmol/L (ref 101–111)
Creatinine, Ser: 1.2 mg/dL (ref 0.61–1.24)
Glucose, Bld: 103 mg/dL — ABNORMAL HIGH (ref 65–99)
HEMATOCRIT: 41 % (ref 39.0–52.0)
Hemoglobin: 13.9 g/dL (ref 13.0–17.0)
Potassium: 4.3 mmol/L (ref 3.5–5.1)
SODIUM: 140 mmol/L (ref 135–145)
TCO2: 23 mmol/L (ref 0–100)

## 2015-11-16 LAB — COMPREHENSIVE METABOLIC PANEL
ALK PHOS: 70 U/L (ref 38–126)
ALT: 19 U/L (ref 17–63)
ANION GAP: 6 (ref 5–15)
AST: 20 U/L (ref 15–41)
Albumin: 4.4 g/dL (ref 3.5–5.0)
BILIRUBIN TOTAL: 0.4 mg/dL (ref 0.3–1.2)
BUN: 16 mg/dL (ref 6–20)
CALCIUM: 9 mg/dL (ref 8.9–10.3)
CO2: 23 mmol/L (ref 22–32)
CREATININE: 1.2 mg/dL (ref 0.61–1.24)
Chloride: 107 mmol/L (ref 101–111)
GFR calc non Af Amer: >60 mL/min (ref >60)
GLUCOSE: 107 mg/dL — AB (ref 65–99)
Potassium: 4.3 mmol/L (ref 3.5–5.1)
SODIUM: 136 mmol/L (ref 135–145)
TOTAL PROTEIN: 7.6 g/dL (ref 6.5–8.1)

## 2015-11-16 LAB — ETHANOL

## 2015-11-16 LAB — RAPID URINE DRUG SCREEN, HOSP PERFORMED
AMPHETAMINES: NOT DETECTED
Barbiturates: NOT DETECTED
Benzodiazepines: NOT DETECTED
Cocaine: NOT DETECTED
OPIATES: NOT DETECTED
TETRAHYDROCANNABINOL: POSITIVE — AB

## 2015-11-16 LAB — URINALYSIS, ROUTINE W REFLEX MICROSCOPIC
BILIRUBIN URINE: NEGATIVE
Glucose, UA: NEGATIVE mg/dL
Hgb urine dipstick: NEGATIVE
KETONES UR: NEGATIVE mg/dL
LEUKOCYTES UA: NEGATIVE
NITRITE: NEGATIVE
Protein, ur: NEGATIVE mg/dL
Specific Gravity, Urine: <1.005 — ABNORMAL LOW (ref 1.005–1.030)
pH: 6 (ref 5.0–8.0)

## 2015-11-16 LAB — CBC
HCT: 40.1 % (ref 39.0–52.0)
Hemoglobin: 14.1 g/dL (ref 13.0–17.0)
MCH: 29.9 pg (ref 26.0–34.0)
MCHC: 35.2 g/dL (ref 30.0–36.0)
MCV: 85 fL (ref 78.0–100.0)
PLATELETS: 223 10*3/uL (ref 150–400)
RBC: 4.72 MIL/uL (ref 4.22–5.81)
RDW: 13.1 % (ref 11.5–15.5)
WBC: 7.5 10*3/uL (ref 4.0–10.5)

## 2015-11-16 LAB — DIFFERENTIAL
Basophils Absolute: 0 10*3/uL (ref 0.0–0.1)
Basophils Relative: 1 %
EOS PCT: 3 %
Eosinophils Absolute: 0.3 10*3/uL (ref 0.0–0.7)
Lymphocytes Relative: 32 %
Lymphs Abs: 2.4 10*3/uL (ref 0.7–4.0)
MONO ABS: 0.4 10*3/uL (ref 0.1–1.0)
Monocytes Relative: 5 %
NEUTROS PCT: 59 %
Neutro Abs: 4.4 10*3/uL (ref 1.7–7.7)

## 2015-11-16 LAB — I-STAT TROPONIN, ED: Troponin i, poc: 0 ng/mL (ref 0.00–0.08)

## 2015-11-16 LAB — PROTIME-INR
INR: 0.97
PROTHROMBIN TIME: 12.9 s (ref 11.4–15.2)

## 2015-11-16 LAB — TSH: TSH: 2.663 u[IU]/mL (ref 0.350–4.500)

## 2015-11-16 LAB — APTT: aPTT: 36 seconds (ref 24–36)

## 2015-11-16 MED ORDER — IBUPROFEN 400 MG PO TABS: 400.0000 mg | ORAL_TABLET | Freq: Four times a day (QID) | ORAL | Status: DC | PRN

## 2015-11-16 MED ORDER — SODIUM CHLORIDE 0.9% FLUSH
3.0000 mL | Freq: Two times a day (BID) | INTRAVENOUS | Status: DC
  Administered 2015-11-16: 3 mL via INTRAVENOUS

## 2015-11-16 MED ORDER — SODIUM CHLORIDE 0.9% FLUSH: 3.0000 mL | INTRAVENOUS | Status: DC | PRN

## 2015-11-16 MED ORDER — ACETAMINOPHEN 650 MG RE SUPP: 650.0000 mg | Freq: Four times a day (QID) | RECTAL | Status: DC | PRN

## 2015-11-16 MED ORDER — ONDANSETRON HCL 4 MG PO TABS: 4.0000 mg | ORAL_TABLET | Freq: Four times a day (QID) | ORAL | Status: DC | PRN

## 2015-11-16 MED ORDER — SENNOSIDES-DOCUSATE SODIUM 8.6-50 MG PO TABS: 1.0000 | ORAL_TABLET | Freq: Every evening | ORAL | Status: DC | PRN

## 2015-11-16 MED ORDER — SODIUM CHLORIDE 0.9% FLUSH
3.0000 mL | Freq: Two times a day (BID) | INTRAVENOUS | Status: DC
  Administered 2015-11-16 – 2015-11-17 (×2): 3 mL via INTRAVENOUS

## 2015-11-16 MED ORDER — ACETAMINOPHEN 325 MG PO TABS: 650.0000 mg | ORAL_TABLET | Freq: Four times a day (QID) | ORAL | Status: DC | PRN

## 2015-11-16 MED ORDER — SODIUM CHLORIDE 0.9 % IV SOLN: 250.0000 mL | INTRAVENOUS | Status: DC | PRN

## 2015-11-16 MED ORDER — TAMSULOSIN HCL 0.4 MG PO CAPS
0.4000 mg | ORAL_CAPSULE | Freq: Every day | ORAL | Status: DC
  Administered 2015-11-16: 0.4 mg via ORAL
  Filled 2015-11-16 (×2): qty 1

## 2015-11-16 MED ORDER — ONDANSETRON HCL 4 MG/2ML IJ SOLN: 4.0000 mg | Freq: Four times a day (QID) | INTRAMUSCULAR | Status: DC | PRN

## 2015-11-16 MED ORDER — IOPAMIDOL (ISOVUE-370) INJECTION 76%
75.0000 mL | Freq: Once | INTRAVENOUS | Status: AC | PRN
  Administered 2015-11-16: 75 mL via INTRAVENOUS

## 2015-11-16 MED ORDER — ASPIRIN EC 325 MG PO TBEC
325.0000 mg | DELAYED_RELEASE_TABLET | Freq: Every day | ORAL | Status: DC
  Administered 2015-11-17: 325 mg via ORAL
  Filled 2015-11-16: qty 1

## 2015-11-16 MED ORDER — ENOXAPARIN SODIUM 40 MG/0.4ML ~~LOC~~ SOLN
40.0000 mg | SUBCUTANEOUS | Status: DC
  Filled 2015-11-16: qty 0.4

## 2015-11-16 MED ORDER — CLOPIDOGREL BISULFATE 75 MG PO TABS
75.0000 mg | ORAL_TABLET | Freq: Every day | ORAL | Status: DC
  Administered 2015-11-17: 75 mg via ORAL
  Filled 2015-11-16: qty 1

## 2015-11-16 NOTE — ED Notes (Signed)
MD at bedside. 

## 2015-11-16 NOTE — ED Provider Notes (Signed)
[facility]-EMERGENCY DEPT Provider Note   CSN: [id number] Arrival date & time: 11/16/15  1022  By signing my name below, I, Placido Sou, attest that this documentation has been prepared under the direction and in the presence of Vanetta Mulders, MD. Electronically Signed: Placido Sou, ED Scribe. 11/16/15. 11:51 AM.   History   Chief Complaint Chief Complaint  Patient presents with  . Numbness    HPI HPI Comments: George Cooper is a 60 y.o. male who presents to the Emergency Department complaining of LUE and LLE numbness onset at 5:30 AM. He notes that he experienced a moderate right occipital HA which began at 11:00 AM yesterday and alleviated after a "couple of hours" and went to bed last night at 9:30 PM feeling normal. Pt denies he regularly experiences HAs. Pt states he woke with his numbness and associated LLE weakness, dizziness, and visual changes stating he saw "a swirly thing" in his peripheral vision. His relatives state he has been speaking normally but "seemed funny when he was putting on his shoes this morning and I asked if he was alright". He states that he had a stent placed in his right leg last week and today was his first day back at work. Pt is currently taking Plavix.   The history is provided by the patient and a relative. No language interpreter was used.  Weakness  Primary symptoms include focal weakness, dizziness. This is a new problem. The current episode started 3 to 5 hours ago. The problem has not changed since onset.There was left lower extremity and left upper extremity focality noted. There has been no fever. Associated symptoms include shortness of breath and headaches. Pertinent negatives include no chest pain, no vomiting and no confusion.    Past Medical History:  Diagnosis Date  . Arthritis    "right knee" (09/18/2012)  . Carotid artery disease (HCC)   . Claudication [facility])    a. 11/09/15 Hawk atherectomy of SFA  . Dizziness   . History of gout     . Hyperlipidemia   . Lyme disease 2006   "after bit by a tick" (09/18/2012)  . Renal artery stenosis (HCC)   . Tobacco abuse    discontinued October 2014    Patient Active Problem List   Diagnosis Date Noted  . CVA (cerebral infarction) 11/16/2015  . Hyperlipidemia 10/10/2012  . Pain of right thigh 09/21/2012  . PAD (peripheral artery disease), s/p athrectomy, PTA/ stent Rt. SFA  09/18/2012 09/19/2012  . Carotid artery disease (HCC) 08/01/2012  . Dizziness 08/01/2012  . Claudication [facility])     Past Surgical History:  Procedure Laterality Date  . ABDOMINAL ANGIOGRAM  09/18/2012   Procedure: ABDOMINAL ANGIOGRAM;  Surgeon: Runell Gess, MD;  Location: [facility] CATH LAB;  Service: Cardiovascular;;  . ATHERECTOMY Right 09/18/2012   Procedure: ATHERECTOMY;  Surgeon: Runell Gess, MD;  Location: [facility] CATH LAB;  Service: Cardiovascular;  Laterality: Right;  right SFA  . KNEE ARTHROSCOPY Right 1990's   "twice" (09/18/2012)  . KNEE ARTHROSCOPY Left 1980's  . LOWER EXTREMITY ANGIOGRAM N/A 09/18/2012   Procedure: LOWER EXTREMITY ANGIOGRAM;  Surgeon: Runell Gess, MD;  Location: [facility] CATH LAB;  Service: Cardiovascular;  Laterality: N/A;  . PERCUTANEOUS STENT INTERVENTION  09/18/2012   Procedure: PERCUTANEOUS STENT INTERVENTION;  Surgeon: Runell Gess, MD;  Location: [facility] CATH LAB;  Service: Cardiovascular;;  right SFA  . PERIPHERAL ARTERIAL STENT GRAFT Right 09/18/2012   "PTA & stenting right SFA"/notes 09/18/2012  .  PERIPHERAL VASCULAR CATHETERIZATION N/A 11/09/2015   Procedure: Abdominal Aortogram w/Lower Extremity;  Surgeon: Runell GessJonathan J Berry, MD;  [facility];  Service: Cardiovascular;  Laterality: N/A;  . PERIPHERAL VASCULAR CATHETERIZATION Right 11/09/2015   Procedure: Peripheral Vascular Balloon Angioplasty;  Surgeon: Runell GessJonathan J Berry, MD;  Location: [facility] INVASIVE CV LAB;  Service: Cardiovascular;  Laterality: Right;  SFA  . PERIPHERAL VASCULAR CATHETERIZATION Right 11/09/2015    Procedure: Peripheral Vascular Atherectomy;  Surgeon: Runell GessJonathan J Berry, MD;  [facility];  Service: Cardiovascular;  Laterality: Right;  SFA       Home Medications    Prior to Admission medications   Medication Sig Start Date End Date Taking? Authorizing Provider  aspirin EC 325 MG EC tablet Take 1 tablet (325 mg total) by mouth daily. 09/19/12  Yes Dwana MelenaBryan W Hager, PA-C  clopidogrel (PLAVIX) 75 MG tablet TAKE 1 TABLET WITH BREAKFAST. 08/14/15  Yes Runell GessJonathan J Berry, MD  tamsulosin (FLOMAX) 0.4 MG CAPS capsule Take 0.4 mg by mouth daily after supper.    Yes Historical Provider, MD  ibuprofen (ADVIL,MOTRIN) 200 MG tablet Take 400 mg by mouth every 6 (six) hours as needed for mild pain.    Historical Provider, MD    Family History Family History  Problem Relation Age of Onset  . Heart Problems Mother     CABG, PCI  . Stroke Father   . Heart failure Maternal Grandmother   . Heart attack Maternal Grandfather   . Heart failure Paternal Grandmother   . Heart attack Paternal Grandfather   . Stroke Paternal Grandfather   . Diabetes Paternal Grandfather   . Heart Problems Sister     PCI    Social History Social History  Substance Use Topics  . Smoking status: Former Smoker    Packs/day: 0.20    Years: 40.00    Types: Cigarettes    Quit date: 11/29/2012  . Smokeless tobacco: Never Used     Comment: trying to quit, smokes 2-3 daily  . Alcohol use No     Allergies   Review of patient's allergies indicates no known allergies.   Review of Systems Review of Systems  Constitutional: Negative for chills and fever.  HENT: Negative for congestion, postnasal drip, rhinorrhea, sinus pressure and sore throat.   Eyes: Positive for visual disturbance.  Respiratory: Positive for shortness of breath. Negative for cough.   Cardiovascular: Negative for chest pain and leg swelling.  Gastrointestinal: Positive for nausea. Negative for abdominal pain, diarrhea and vomiting.   Genitourinary: Negative for dysuria.  Musculoskeletal: Negative for back pain.  Skin: Negative for rash.  Neurological: Positive for dizziness, focal weakness, weakness, numbness and headaches. Negative for syncope and light-headedness.  Hematological: Does not bruise/bleed easily.  Psychiatric/Behavioral: Negative for confusion.  All other systems reviewed and are negative.  Physical Exam Updated Vital Signs BP 182/83   Pulse 63   Temp 97.6 F (36.4 C)   Resp 12   Ht 5\' 10"  (1.778 m)   Wt 75.8 kg   SpO2 98%   BMI 23.96 kg/m   Physical Exam  Constitutional: He is oriented to person, place, and time. He appears well-developed and well-nourished. No distress.  HENT:  Head: Normocephalic and atraumatic.  Mouth/Throat: Oropharynx is clear and moist and mucous membranes are normal.  Eyes: Conjunctivae and EOM are normal. Pupils are equal, round, and reactive to light. No scleral icterus.  Neck: Normal range of motion. Neck supple. No tracheal deviation present.  Cardiovascular: Normal rate, regular rhythm, normal heart sounds and intact distal pulses.  Exam reveals no gallop and no friction rub.   No murmur heard. Pulmonary/Chest: Effort normal and breath sounds normal. No respiratory distress. He has no wheezes. He has no rales.  Abdominal: Soft. Bowel sounds are normal. There is no tenderness.  Musculoskeletal: Normal range of motion. He exhibits no edema.  No swelling noted to the ankles.   Neurological: He is alert and oriented to person, place, and time.  Left leg weakness compared to the right. Left hand and arm weakness compared to the right.   Skin: Skin is warm and dry.  Psychiatric: He has a normal mood and affect. His behavior is normal.  Nursing note and vitals reviewed.  ED Treatments / Results  Labs (all labs ordered are listed, but only abnormal results are displayed) Labs Reviewed  COMPREHENSIVE METABOLIC PANEL - Abnormal; Notable for the following:        Result Value   Glucose, Bld 107 (*)    All other components within normal limits  URINE RAPID DRUG SCREEN, HOSP PERFORMED - Abnormal; Notable for the following:    Tetrahydrocannabinol POSITIVE (*)    All other components within normal limits  URINALYSIS, ROUTINE W REFLEX MICROSCOPIC (NOT AT [facility]) - Abnormal; Notable for the following:    Specific Gravity, Urine <1.005 (*)    All other components within normal limits  I-STAT CHEM 8, ED - Abnormal; Notable for the following:    Glucose, Bld 103 (*)    All other components within normal limits  ETHANOL  PROTIME-INR  APTT  CBC  DIFFERENTIAL  I-STAT TROPOININ, ED    EKG  EKG Interpretation  Date/Time:  Monday November 16 2015 10:42:17 EDT Ventricular Rate:  58 PR Interval:    QRS Duration: 115 QT Interval:  457 QTC Calculation: 449 R Axis:   77 Text Interpretation:  Sinus rhythm Short PR interval Incomplete right bundle branch block Probable anterolateral infarct, old Confirmed by Gil Ingwersen  MD, Bobby Ragan [phone]) on 11/16/2015 11:55:38 AM      Radiology Ct Head Wo Contrast  Result Date: 11/16/2015 CLINICAL DATA:  LEFT arm numbness. EXAM: CT HEAD WITHOUT CONTRAST TECHNIQUE: Contiguous axial images were obtained from the base of the skull through the vertex without intravenous contrast. COMPARISON:  None. FINDINGS: Brain: No acute intracranial hemorrhage. No focal mass lesion. No CT evidence of acute infarction. No midline shift or mass effect. No hydrocephalus. Basilar cisterns are patent. Vascular: No hyperdense vessel or unexpected calcification. Skull: Normal. Negative for fracture or focal lesion. Sinuses/Orbits: Paranasal sinuses and mastoid air cells are clear. Orbits are clear. Other: None IMPRESSION: No acute intracranial findings. No CT evidence infarction. No intracranial hemorrhage. Electronically Signed   By: Genevive Bi M.D.   On: 11/16/2015 11:44   Mr Brain Wo Contrast (neuro Protocol)  Result Date:  11/16/2015 CLINICAL DATA:  Left-sided weakness. EXAM: MRI HEAD WITHOUT CONTRAST TECHNIQUE: Multiplanar, multiecho pulse sequences of the brain and surrounding structures were obtained without intravenous contrast. COMPARISON:  Head CT 11/16/2015 FINDINGS: Brain: No acute infarction, hemorrhage, hydrocephalus, extra-axial collection or mass lesion. 2 tiny remote infarcts in the peripheral right cerebellum. No notable white matter disease. Mild generalized volume loss, age congruent. Vascular: Normal flow voids. Skull and upper cervical spine: Normal marrow signal. Sinuses/Orbits: Negative. IMPRESSION: No acute finding, including infarct.  No explanation for weakness. Electronically Signed   By: Marnee Spring M.D.   On: 11/16/2015 13:13    Procedures  Procedures  DIAGNOSTIC STUDIES: Oxygen Saturation is 98% on RA, normal by my interpretation.    COORDINATION OF CARE: 11:49 AM Discussed next steps with pt. Pt verbalized understanding and is agreeable with the plan.    Medications Ordered in ED Medications - No data to display   Initial Impression / Assessment and Plan / ED Course  I have reviewed the triage vital signs and the nursing notes.  Pertinent labs & imaging results that were available during my care of the patient were reviewed by me and considered in my medical decision making (see chart for details).  Clinical Course   The patient awoke with weakness to the left leg greater than left hand numbness. Family thought maybe speech was off a little bit. 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 Symptoms are improving but have not resolved. Head CT and MRI without any acute findings. Patient has significant history for peripheral vascular disease. Therefore has risk factors for stroke. Discussed with on-call neurologist Dr. doing Sharene Butters and he recommends admission. Hospitalist will admit.    I personally performed the services described  in this documentation, which was scribed in my presence. The recorded information has been reviewed and is accurate.    Final Clinical Impressions(s) / ED Diagnoses   Final diagnoses:  Cerebral infarction due to unspecified mechanism    New Prescriptions New Prescriptions   No medications on file     Vanetta Mulders, MD 11/16/15 1414

## 2015-11-16 NOTE — ED Triage Notes (Signed)
Patient complaining of left arm and left leg numbness upon awakening this morning at 0530. States he had severe headache yesterday but denies pain today. Equal grips bilaterally. Has minor weakness with left leg at this time. States he had balloon angioplasty for blockage last week. Patient able to stand and ambulate x 1 step to bed.

## 2015-11-16 NOTE — ED Notes (Signed)
Cancelled code stroke 

## 2015-11-16 NOTE — ED Notes (Signed)
Notified Dr Deretha EmoryZackowski of patient symptoms.

## 2015-11-16 NOTE — H&P (Signed)
History and Physical    George Cooper WJX:[id number] DOB: October 18, 1955 DOA: 11/16/2015  Referring MD/NP/PA: Vanetta Mulders, EDP PCP: Lenise Herald, PA-C  Patient coming from: home  Chief Complaint: left sided weakness  HPI: George Cooper is a 60 y.o. male with h/o PAD who comes in today with above complaints. He started experiencing symptoms at around 8:30 am when he noticed that he had to physically hold his left leg in order to move it. As the day progressed weakness extended to his left arm and then at around lunchtime he noticed tingling over his left face. At that point he decided to come into the hospital for evaluation. He notes that yesterday he had a HA and used to suffer from migraines as a child and teenager. CT Head negative. MRI negative. Admission requested as symptoms persist.  Past Medical/Surgical History: Past Medical History:  Diagnosis Date  . Arthritis    "right knee" (09/18/2012)  . Carotid artery disease (HCC)   . Claudication [facility])    a. 11/09/15 Hawk atherectomy of SFA  . Dizziness   . History of gout   . Hyperlipidemia   . Lyme disease 2006   "after bit by a tick" (09/18/2012)  . Renal artery stenosis (HCC)   . Tobacco abuse    discontinued October 2014    Past Surgical History:  Procedure Laterality Date  . ABDOMINAL ANGIOGRAM  09/18/2012   Procedure: ABDOMINAL ANGIOGRAM;  Surgeon: Runell Gess, MD;  Location: [facility] CATH LAB;  Service: Cardiovascular;;  . ATHERECTOMY Right 09/18/2012   Procedure: ATHERECTOMY;  Surgeon: Runell Gess, MD;  Location: [facility] CATH LAB;  Service: Cardiovascular;  Laterality: Right;  right SFA  . KNEE ARTHROSCOPY Right 1990's   "twice" (09/18/2012)  . KNEE ARTHROSCOPY Left 1980's  . LOWER EXTREMITY ANGIOGRAM N/A 09/18/2012   Procedure: LOWER EXTREMITY ANGIOGRAM;  Surgeon: Runell Gess, MD;  Location: [facility] CATH LAB;  Service: Cardiovascular;  Laterality: N/A;  . PERCUTANEOUS STENT INTERVENTION  09/18/2012   Procedure:  PERCUTANEOUS STENT INTERVENTION;  Surgeon: Runell Gess, MD;  Location: [facility] CATH LAB;  Service: Cardiovascular;;  right SFA  . PERIPHERAL ARTERIAL STENT GRAFT Right 09/18/2012   "PTA & stenting right SFA"/notes 09/18/2012  . PERIPHERAL VASCULAR CATHETERIZATION N/A 11/09/2015   Procedure: Abdominal Aortogram w/Lower Extremity;  Surgeon: Runell Gess, MD;  Location: [facility] INVASIVE CV LAB;  Service: Cardiovascular;  Laterality: N/A;  . PERIPHERAL VASCULAR CATHETERIZATION Right 11/09/2015   Procedure: Peripheral Vascular Balloon Angioplasty;  Surgeon: Runell Gess, MD;  Location: [facility] INVASIVE CV LAB;  Service: Cardiovascular;  Laterality: Right;  SFA  . PERIPHERAL VASCULAR CATHETERIZATION Right 11/09/2015   Procedure: Peripheral Vascular Atherectomy;  Surgeon: Runell Gess, MD;  Location: [facility] INVASIVE CV LAB;  Service: Cardiovascular;  Laterality: Right;  SFA    Social History:  reports that he quit smoking about 2 years ago. His smoking use included Cigarettes. He has a 8.00 pack-year smoking history. He has never used smokeless tobacco. He reports that he uses drugs, including Marijuana. He reports that he does not drink alcohol.  Allergies: No Known Allergies  Family History:  Family History  Problem Relation Age of Onset  . Heart Problems Mother     CABG, PCI  . Stroke Father   . Heart failure Maternal Grandmother   . Heart attack Maternal Grandfather   . Heart failure Paternal Grandmother   . Heart attack Paternal Grandfather   . Stroke Paternal Grandfather   .  Diabetes Paternal Grandfather   . Heart Problems Sister     PCI    Prior to Admission medications   Medication Sig Start Date End Date Taking? Authorizing Provider  aspirin EC 325 MG EC tablet Take 1 tablet (325 mg total) by mouth daily. 09/19/12  Yes Dwana MelenaBryan W Hager, PA-C  clopidogrel (PLAVIX) 75 MG tablet TAKE 1 TABLET WITH BREAKFAST. 08/14/15  Yes Runell GessJonathan J Berry, MD  tamsulosin (FLOMAX) 0.4 MG CAPS capsule Take 0.4  mg by mouth daily after supper.    Yes Historical Provider, MD  ibuprofen (ADVIL,MOTRIN) 200 MG tablet Take 400 mg by mouth every 6 (six) hours as needed for mild pain.    Historical Provider, MD    Review of Systems:  Constitutional: Denies fever, chills, diaphoresis, appetite change and fatigue.  HEENT: Denies photophobia, eye pain, redness, hearing loss, ear pain, congestion, sore throat, rhinorrhea, sneezing, mouth sores, trouble swallowing, neck pain, neck stiffness and tinnitus.   Respiratory: Denies SOB, DOE, cough, chest tightness,  and wheezing.   Cardiovascular: Denies chest pain, palpitations and leg swelling.  Gastrointestinal: Denies nausea, vomiting, abdominal pain, diarrhea, constipation, blood in stool and abdominal distention.  Genitourinary: Denies dysuria, urgency, frequency, hematuria, flank pain and difficulty urinating.  Endocrine: Denies: hot or cold intolerance, sweats, changes in hair or nails, polyuria, polydipsia. Musculoskeletal: Denies myalgias, back pain, joint swelling, arthralgias and gait problem.  Skin: Denies pallor, rash and wound.  Neurological: Denies dizziness, seizures, syncope, , light-headedness, Hematological: Denies adenopathy. Easy bruising, personal or family bleeding history  Psychiatric/Behavioral: Denies suicidal ideation, mood changes, confusion, nervousness, sleep disturbance and agitation    Physical Exam: Vitals:   11/16/15 1331 11/16/15 1410 11/16/15 1430 11/16/15 1614  BP:  162/72 173/86 (!) 152/75  Pulse:  (!) 54 (!) 56 (!) 53  Resp:  16 16 18   Temp: 97.6 F (36.4 C)   97.7 F (36.5 C)  TempSrc:    Oral  SpO2:  99% 98% 100%  Weight:    74.6 kg (164 lb 8 oz)  Height:    5\' 10"  (1.778 m)      Constitutional: NAD, calm, comfortable Eyes: PERRL, lids and conjunctivae normal ENMT: Mucous membranes are moist. Posterior pharynx clear of any exudate or lesions.Normal dentition.  Neck: normal, supple, no masses, no  thyromegaly Respiratory: clear to auscultation bilaterally, no wheezing, no crackles. Normal respiratory effort. No accessory muscle use.  Cardiovascular: Regular rate and rhythm, no murmurs / rubs / gallops. No extremity edema. 2+ pedal pulses. No carotid bruits.  Abdomen: no tenderness, no masses palpated. No hepatosplenomegaly. Bowel sounds positive.  Musculoskeletal: no clubbing / cyanosis. No joint deformity upper and lower extremities. Good ROM, no contractures. Normal muscle tone.  Skin: no rashes, lesions, ulcers. No induration Neurologic: CN 2-12 grossly intact. Sensation intact, DTR normal. Strength 5/5 in all 4.  Psychiatric: Normal judgment and insight. Alert and oriented x 3. Normal mood.    Labs on Admission: I have personally reviewed the following labs and imaging studies  CBC:  Recent Labs Lab 11/10/15 0357 11/16/15 1043 11/16/15 1054  WBC 9.5 7.5  --   NEUTROABS  --  4.4  --   HGB 13.9 14.1 13.9  HCT 42.0 40.1 41.0  MCV 87.0 85.0  --   PLT 202 223  --    Basic Metabolic Panel:  Recent Labs Lab 11/10/15 0357 11/16/15 1043 11/16/15 1054  NA 141 136 140  K 4.0 4.3 4.3  CL 108 107 106  CO2 25 23  --   GLUCOSE 108* 107* 103*  BUN 11 16 15   CREATININE 1.31* 1.20 1.20  CALCIUM 9.2 9.0  --    GFR: Estimated Creatinine Clearance: 66.7 mL/min (by C-G formula based on SCr of 1.2 mg/dL). Liver Function Tests:  Recent Labs Lab 11/16/15 1043  AST 20  ALT 19  ALKPHOS 70  BILITOT 0.4  PROT 7.6  ALBUMIN 4.4   No results for input(s): LIPASE, AMYLASE in the last 168 hours. No results for input(s): AMMONIA in the last 168 hours. Coagulation Profile:  Recent Labs Lab 11/16/15 1043  INR 0.97   Cardiac Enzymes: No results for input(s): CKTOTAL, CKMB, CKMBINDEX, TROPONINI in the last 168 hours. BNP (last 3 results) No results for input(s): PROBNP in the last 8760 hours. HbA1C: No results for input(s): HGBA1C in the last 72 hours. CBG: No results for  input(s): GLUCAP in the last 168 hours. Lipid Profile: No results for input(s): CHOL, HDL, LDLCALC, TRIG, CHOLHDL, LDLDIRECT in the last 72 hours. Thyroid Function Tests: No results for input(s): TSH, T4TOTAL, FREET4, T3FREE, THYROIDAB in the last 72 hours. Anemia Panel: No results for input(s): VITAMINB12, FOLATE, FERRITIN, TIBC, IRON, RETICCTPCT in the last 72 hours. Urine analysis:    Component Value Date/Time   COLORURINE YELLOW 11/16/2015 1037   APPEARANCEUR CLEAR 11/16/2015 1037   LABSPEC <1.005 (L) 11/16/2015 1037   PHURINE 6.0 11/16/2015 1037   GLUCOSEU NEGATIVE 11/16/2015 1037   HGBUR NEGATIVE 11/16/2015 1037   BILIRUBINUR NEGATIVE 11/16/2015 1037   KETONESUR NEGATIVE 11/16/2015 1037   PROTEINUR NEGATIVE 11/16/2015 1037   NITRITE NEGATIVE 11/16/2015 1037   LEUKOCYTESUR NEGATIVE 11/16/2015 1037   Sepsis Labs: @LABRCNTIP (procalcitonin:4,lacticidven:4) )No results found for this or any previous visit (from the past 240 hour(s)).   Radiological Exams on Admission: Ct Head Wo Contrast  Result Date: 11/16/2015 CLINICAL DATA:  LEFT arm numbness. EXAM: CT HEAD WITHOUT CONTRAST TECHNIQUE: Contiguous axial images were obtained from the base of the skull through the vertex without intravenous contrast. COMPARISON:  None. FINDINGS: Brain: No acute intracranial hemorrhage. No focal mass lesion. No CT evidence of acute infarction. No midline shift or mass effect. No hydrocephalus. Basilar cisterns are patent. Vascular: No hyperdense vessel or unexpected calcification. Skull: Normal. Negative for fracture or focal lesion. Sinuses/Orbits: Paranasal sinuses and mastoid air cells are clear. Orbits are clear. Other: None IMPRESSION: No acute intracranial findings. No CT evidence infarction. No intracranial hemorrhage. Electronically Signed   By: Genevive Bi M.D.   On: 11/16/2015 11:44   Mr Brain Wo Contrast (neuro Protocol)  Result Date: 11/16/2015 CLINICAL DATA:  Left-sided weakness.  EXAM: MRI HEAD WITHOUT CONTRAST TECHNIQUE: Multiplanar, multiecho pulse sequences of the brain and surrounding structures were obtained without intravenous contrast. COMPARISON:  Head CT 11/16/2015 FINDINGS: Brain: No acute infarction, hemorrhage, hydrocephalus, extra-axial collection or mass lesion. 2 tiny remote infarcts in the peripheral right cerebellum. No notable white matter disease. Mild generalized volume loss, age congruent. Vascular: Normal flow voids. Skull and upper cervical spine: Normal marrow signal. Sinuses/Orbits: Negative. IMPRESSION: No acute finding, including infarct.  No explanation for weakness. Electronically Signed   By: Marnee Spring M.D.   On: 11/16/2015 13:13    EKG: Independently reviewed. NSR, RBB  Assessment/Plan Principal Problem:   Left-sided weakness Active Problems:   PAD (peripheral artery disease), s/p athrectomy, PTA/ stent Rt. SFA  09/18/2012   Hyperlipidemia   Headache    Left-sided weakness -MRI negative for CVA. -with recent HA and  h/o migraines, wonder if this could represent a complicated migraine. -Neurology consultation requested. -Request PT evaluation in am.  PAD -Continue ASA/plavix.   DVT prophylaxis: lovenox  Code Status: full code  Family Communication: wife and sister at bedside  Disposition Plan: likely home in 24 hours  Consults called: neurology  Admission status: observation    Time Spent: 75 minutes  Chaya Jan MD [facility] Pager [phone]  If 7PM-7AM, please contact night-coverage [email or web address] Password [facility]  11/16/2015, 5:36 PM

## 2015-11-16 NOTE — ED Notes (Signed)
CT made aware of patient. He is next to get CT.

## 2015-11-16 NOTE — Consult Note (Signed)
[place] A. Merlene Laughter, MD     [email or web address]          George Cooper is an 60 y.o. male.   ASSESSMENT/PLAN: 1. Assessment patient likely has a transient ischemic attack. This appears to be small vessel disease. Risk factors coronary artery disease, peripheral vascular disease including carotid disease and age.    RECOMMENDATION: Typical workup including echo, labs - including hemoglobin A1c, lipid panel, RPR, homocystine level, CTA of the brain. We'll continue with current dual antiplatelet agent. In lieu of carotid Dopplers CTA of the brain neck will be obtained.     There is a 60 year old white male who has a remote history of migraine headaches as a child/teenager. The patient reports that yesterday he developed the acute onset of right occipital headache lasted for a couple hours. Headaches were associated with nausea but no emesis. He also was associated with photophobia and phonophobia. He woke up this morning and about 10 minutes after he woke up 5 AM in the morning he developed tingling and numbness involving the left leg. The left upper extremity also became involving the left facial region. He reports having heaviness as the day went on involving the left leg and left upper extremity. He also reports having some dysarthria. He decided to seek medical attention was taken to the hospital about 10:00. It appears she was outside the window for any type of intervention particular TPA. The patient has not had any recurrent headaches. He is on dual antiplatelet agents for peripheral vascular disease and has been compliant with this. He also take NSAIDs but very rarely. He has been prescribed 3 different types of statins but has not been able to tolerate this. He is on a injection for lipid control. The patient reports that he is now back to normal. He indicates that his symptoms resolved several hours after it started. He indicates at about 4 PM today his symptoms have  returned to normal. He denies chest pain, GI GU symptoms. The review systems is otherwise negative.    GENERAL:  Very pleasant thin male in no acute distress.  HEENT: Normal  ABDOMEN: soft  EXTREMITIES: No edema   BACK: Normal  SKIN: Normal by inspection.    MENTAL STATUS: Alert and oriented. Speech, language and cognition are generally intact. Judgment and insight normal.   CRANIAL NERVES: Pupils are equal, round and reactive to light and accomodation; extra ocular movements are full, there is no significant nystagmus; visual fields are full; upper and lower facial muscles are normal in strength and symmetric, there is no flattening of the nasolabial folds; tongue is midline; uvula is midline; shoulder elevation is normal.  MOTOR: Normal tone, bulk and strength; no pronator drift. No drift of the upper lower extremities  COORDINATION: Left finger to nose is normal, right finger to nose is normal, No rest tremor; no intention tremor; no postural tremor; no bradykinesia.  REFLEXES: Deep tendon reflexes are symmetrical and normal. Babinski reflexes are flexor bilaterally.   SENSATION: Normal to light touch and temperature. The patient does not extinguish to double simultaneous stimulation either tactilely or visually.       NIH stroke scale 0      Blood pressure (!) 152/75, pulse (!) 53, temperature 97.7 F (36.5 C), temperature source Oral, resp. rate 18, height '5\' 10"'$  (1.778 m), weight 164 lb 8 oz (74.6 kg), SpO2 100 %.  Past Medical History:  Diagnosis Date  . Arthritis    "right knee" (09/18/2012)  .  Carotid artery disease ([place])   . Claudication [facility])    a. 11/09/15 [place] atherectomy of SFA  . Dizziness   . History of gout   . Hyperlipidemia   . Lyme disease 2006   "after bit by a tick" (09/18/2012)  . Renal artery stenosis ([place])   . Tobacco abuse    discontinued October 2014    Past Surgical History:  Procedure Laterality Date  . ABDOMINAL ANGIOGRAM   09/18/2012   Procedure: ABDOMINAL ANGIOGRAM;  Surgeon: Lorretta Harp, MD;  Location: [facility] CATH LAB;  Service: Cardiovascular;;  . ATHERECTOMY Right 09/18/2012   Procedure: ATHERECTOMY;  Surgeon: Lorretta Harp, MD;  Location: [facility] CATH LAB;  Service: Cardiovascular;  Laterality: Right;  right SFA  . KNEE ARTHROSCOPY Right 1990's   "twice" (09/18/2012)  . KNEE ARTHROSCOPY Left 1980's  . LOWER EXTREMITY ANGIOGRAM N/A 09/18/2012   Procedure: LOWER EXTREMITY ANGIOGRAM;  Surgeon: Lorretta Harp, MD;  Location: [facility] CATH LAB;  Service: Cardiovascular;  Laterality: N/A;  . PERCUTANEOUS STENT INTERVENTION  09/18/2012   Procedure: PERCUTANEOUS STENT INTERVENTION;  Surgeon: Lorretta Harp, MD;  Location: [facility] CATH LAB;  Service: Cardiovascular;;  right SFA  . PERIPHERAL ARTERIAL STENT GRAFT Right 09/18/2012   "PTA & stenting right SFA"/notes 09/18/2012  . PERIPHERAL VASCULAR CATHETERIZATION N/A 11/09/2015   Procedure: Abdominal Aortogram w/Lower Extremity;  Surgeon: Lorretta Harp, MD;  Location: [place] CV LAB;  Service: Cardiovascular;  Laterality: N/A;  . PERIPHERAL VASCULAR CATHETERIZATION Right 11/09/2015   Procedure: Peripheral Vascular Balloon Angioplasty;  Surgeon: Lorretta Harp, MD;  Location: [place] CV LAB;  Service: Cardiovascular;  Laterality: Right;  SFA  . PERIPHERAL VASCULAR CATHETERIZATION Right 11/09/2015   Procedure: Peripheral Vascular Atherectomy;  Surgeon: Lorretta Harp, MD;  Location: [place] CV LAB;  Service: Cardiovascular;  Laterality: Right;  SFA    Family History  Problem Relation Age of Onset  . Heart Problems Mother     CABG, PCI  . Stroke Father   . Heart failure Maternal Grandmother   . Heart attack Maternal Grandfather   . Heart failure Paternal Grandmother   . Heart attack Paternal Grandfather   . Stroke Paternal Grandfather   . Diabetes Paternal Grandfather   . Heart Problems Sister     PCI    Social History:  reports that he quit smoking about  2 years ago. His smoking use included Cigarettes. He has a 8.00 pack-year smoking history. He has never used smokeless tobacco. He reports that he uses drugs, including Marijuana. He reports that he does not drink alcohol.  Allergies: No Known Allergies  Medications: Prior to Admission medications   Medication Sig Start Date End Date Taking? Authorizing Provider  aspirin EC 325 MG EC tablet Take 1 tablet (325 mg total) by mouth daily. 09/19/12  Yes Brett Canales, PA-C  clopidogrel (PLAVIX) 75 MG tablet TAKE 1 TABLET WITH BREAKFAST. 08/14/15  Yes Lorretta Harp, MD  tamsulosin (FLOMAX) 0.4 MG CAPS capsule Take 0.4 mg by mouth daily after supper.    Yes Historical Provider, MD  ibuprofen (ADVIL,MOTRIN) 200 MG tablet Take 400 mg by mouth every 6 (six) hours as needed for mild pain.    Historical Provider, MD    Scheduled Meds: . [START ON 11/17/2015] aspirin  325 mg Oral Daily  . [START ON 11/17/2015] clopidogrel  75 mg Oral Daily  . enoxaparin (LOVENOX) injection  40 mg Subcutaneous Q24H  . sodium chloride flush  3 mL  Intravenous Q12H  . sodium chloride flush  3 mL Intravenous Q12H  . tamsulosin  0.4 mg Oral QPC supper   Continuous Infusions:  PRN Meds:.sodium chloride, acetaminophen **OR** acetaminophen, ibuprofen, ondansetron **OR** ondansetron (ZOFRAN) IV, senna-docusate, sodium chloride flush     Results for orders placed or performed during the hospital encounter of 11/16/15 (from the past 48 hour(s))  Urine rapid drug screen (hosp performed)not at [facility]     Status: Abnormal   Collection Time: 11/16/15 10:37 AM  Result Value Ref Range   Opiates NONE DETECTED NONE DETECTED   Cocaine NONE DETECTED NONE DETECTED   Benzodiazepines NONE DETECTED NONE DETECTED   Amphetamines NONE DETECTED NONE DETECTED   Tetrahydrocannabinol POSITIVE (A) NONE DETECTED   Barbiturates NONE DETECTED NONE DETECTED    Comment:        DRUG SCREEN FOR MEDICAL PURPOSES ONLY.  IF CONFIRMATION IS NEEDED FOR  ANY PURPOSE, NOTIFY LAB WITHIN 5 DAYS.        LOWEST DETECTABLE LIMITS FOR URINE DRUG SCREEN Drug Class       Cutoff (ng/mL) Amphetamine      1000 Barbiturate      200 Benzodiazepine   696 Tricyclics       789 Opiates          300 Cocaine          300 THC              50   Urinalysis, Routine w reflex microscopic (not at [facility])     Status: Abnormal   Collection Time: 11/16/15 10:37 AM  Result Value Ref Range   Color, Urine YELLOW YELLOW   APPearance CLEAR CLEAR   Specific Gravity, Urine <1.005 (L) 1.005 - 1.030   pH 6.0 5.0 - 8.0   Glucose, UA NEGATIVE NEGATIVE mg/dL   Hgb urine dipstick NEGATIVE NEGATIVE   Bilirubin Urine NEGATIVE NEGATIVE   Ketones, ur NEGATIVE NEGATIVE mg/dL   Protein, ur NEGATIVE NEGATIVE mg/dL   Nitrite NEGATIVE NEGATIVE   Leukocytes, UA NEGATIVE NEGATIVE    Comment: MICROSCOPIC NOT DONE ON URINES WITH NEGATIVE PROTEIN, BLOOD, LEUKOCYTES, NITRITE, OR GLUCOSE <1000 mg/dL.  Ethanol     Status: None   Collection Time: 11/16/15 10:43 AM  Result Value Ref Range   Alcohol, Ethyl (B) <5 <5 mg/dL    Comment:        LOWEST DETECTABLE LIMIT FOR SERUM ALCOHOL IS 5 mg/dL FOR MEDICAL PURPOSES ONLY   Protime-INR     Status: None   Collection Time: 11/16/15 10:43 AM  Result Value Ref Range   Prothrombin Time 12.9 11.4 - 15.2 seconds   INR 0.97   APTT     Status: None   Collection Time: 11/16/15 10:43 AM  Result Value Ref Range   aPTT 36 24 - 36 seconds  CBC     Status: None   Collection Time: 11/16/15 10:43 AM  Result Value Ref Range   WBC 7.5 4.0 - 10.5 K/uL   RBC 4.72 4.22 - 5.81 MIL/uL   Hemoglobin 14.1 13.0 - 17.0 g/dL   HCT 40.1 39.0 - 52.0 %   MCV 85.0 78.0 - 100.0 fL   MCH 29.9 26.0 - 34.0 pg   MCHC 35.2 30.0 - 36.0 g/dL   RDW 13.1 11.5 - 15.5 %   Platelets 223 150 - 400 K/uL  Differential     Status: None   Collection Time: 11/16/15 10:43 AM  Result Value Ref Range   Neutrophils  Relative % 59 %   Neutro Abs 4.4 1.7 - 7.7 K/uL   Lymphocytes  Relative 32 %   Lymphs Abs 2.4 0.7 - 4.0 K/uL   Monocytes Relative 5 %   Monocytes Absolute 0.4 0.1 - 1.0 K/uL   Eosinophils Relative 3 %   Eosinophils Absolute 0.3 0.0 - 0.7 K/uL   Basophils Relative 1 %   Basophils Absolute 0.0 0.0 - 0.1 K/uL  Comprehensive metabolic panel     Status: Abnormal   Collection Time: 11/16/15 10:43 AM  Result Value Ref Range   Sodium 136 135 - 145 mmol/L   Potassium 4.3 3.5 - 5.1 mmol/L   Chloride 107 101 - 111 mmol/L   CO2 23 22 - 32 mmol/L   Glucose, Bld 107 (H) 65 - 99 mg/dL   BUN 16 6 - 20 mg/dL   Creatinine, Ser 1.20 0.61 - 1.24 mg/dL   Calcium 9.0 8.9 - 10.3 mg/dL   Total Protein 7.6 6.5 - 8.1 g/dL   Albumin 4.4 3.5 - 5.0 g/dL   AST 20 15 - 41 U/L   ALT 19 17 - 63 U/L   Alkaline Phosphatase 70 38 - 126 U/L   Total Bilirubin 0.4 0.3 - 1.2 mg/dL   GFR calc non Af Amer >60 >60 mL/min   GFR calc Af Amer >60 >60 mL/min    Comment: (NOTE) The eGFR has been calculated using the CKD EPI equation. This calculation has not been validated in all clinical situations. eGFR's persistently <60 mL/min signify possible Chronic Kidney Disease.    Anion gap 6 5 - 15  I-stat troponin, ED (not at [facility], [facility])     Status: None   Collection Time: 11/16/15 10:52 AM  Result Value Ref Range   Troponin i, poc 0.00 0.00 - 0.08 ng/mL   Comment 3            Comment: Due to the release kinetics of cTnI, a negative result within the first hours of the onset of symptoms does not rule out myocardial infarction with certainty. If myocardial infarction is still suspected, repeat the test at appropriate intervals.   I-Stat Chem 8, ED  (not at [facility], [facility])     Status: Abnormal   Collection Time: 11/16/15 10:54 AM  Result Value Ref Range   Sodium 140 135 - 145 mmol/L   Potassium 4.3 3.5 - 5.1 mmol/L   Chloride 106 101 - 111 mmol/L   BUN 15 6 - 20 mg/dL   Creatinine, Ser 1.20 0.61 - 1.24 mg/dL   Glucose, Bld 103 (H) 65 - 99 mg/dL   Calcium, Ion 1.17 1.15 - 1.40 mmol/L     TCO2 23 0 - 100 mmol/L   Hemoglobin 13.9 13.0 - 17.0 g/dL   HCT 41.0 39.0 - 52.0 %    Studies/Results:  BRAIN MRI FINDINGS: Brain: No acute infarction, hemorrhage, hydrocephalus, extra-axial collection or mass lesion. 2 tiny remote infarcts in the peripheral right cerebellum. No notable white matter disease. Mild generalized volume loss, age congruent.  Vascular: Normal flow voids.  Skull and upper cervical spine: Normal marrow signal.  Sinuses/Orbits: Negative.  IMPRESSION: No acute finding, including infarct.  No explanation for weakness.       Brain MRI is reviewed in person. No increased signal is seen on FLAIR imaging suggestive of acute infarct. No other acute changes are seen. There is mild atrophy upper appropriate for age. There are minimal deep white matter lesions seen also appropriate for age.  No hemorrhage is appreciated. 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      [place] A. Merlene Laughter, M.D.  Diplomate, Tax adviser of Psychiatry and Neurology ( Neurology). 11/16/2015, 6:23 PM

## 2015-11-17 ENCOUNTER — Observation Stay (HOSPITAL_BASED_OUTPATIENT_CLINIC_OR_DEPARTMENT_OTHER): Payer: Self-pay

## 2015-11-17 DIAGNOSIS — I6350 Cerebral infarction due to unspecified occlusion or stenosis of unspecified cerebral artery: Secondary | ICD-10-CM

## 2015-11-17 LAB — VITAMIN B12: VITAMIN B 12: 239 pg/mL (ref 180–914)

## 2015-11-17 LAB — ECHOCARDIOGRAM COMPLETE
HEIGHTINCHES: 70 in
WEIGHTICAEL: 2632 [oz_av]

## 2015-11-17 LAB — BASIC METABOLIC PANEL
ANION GAP: 7 (ref 5–15)
BUN: 17 mg/dL (ref 6–20)
CHLORIDE: 105 mmol/L (ref 101–111)
CO2: 24 mmol/L (ref 22–32)
CREATININE: 1.29 mg/dL — AB (ref 0.61–1.24)
Calcium: 9.1 mg/dL (ref 8.9–10.3)
GFR calc non Af Amer: 58 mL/min — ABNORMAL LOW (ref >60)
GLUCOSE: 111 mg/dL — AB (ref 65–99)
Potassium: 4.1 mmol/L (ref 3.5–5.1)
Sodium: 136 mmol/L (ref 135–145)

## 2015-11-17 LAB — CBC
HCT: 41.9 % (ref 39.0–52.0)
HEMOGLOBIN: 14.3 g/dL (ref 13.0–17.0)
MCH: 29.2 pg (ref 26.0–34.0)
MCHC: 34.1 g/dL (ref 30.0–36.0)
MCV: 85.5 fL (ref 78.0–100.0)
PLATELETS: 230 10*3/uL (ref 150–400)
RBC: 4.9 MIL/uL (ref 4.22–5.81)
RDW: 13.2 % (ref 11.5–15.5)
WBC: 7.1 10*3/uL (ref 4.0–10.5)

## 2015-11-17 MED ORDER — STROKE: EARLY STAGES OF RECOVERY BOOK
Freq: Once | Status: DC
  Filled 2015-11-17: qty 1

## 2015-11-17 NOTE — Progress Notes (Signed)
Discharged PT per MD order and protocol. Discharge handouts reviewed/explained. Education completed.  Pt verbalized understanding and left with all belongings. VSS. IV catheter D/C.  Patient wheeled down by staff member.  

## 2015-11-17 NOTE — Discharge Instructions (Signed)
Stroke Prevention Some medical conditions and behaviors are associated with an increased chance of having a stroke. You may prevent a stroke by making healthy choices and managing medical conditions. HOW CAN I REDUCE MY RISK OF HAVING A STROKE?   Stay physically active. Get at least 30 minutes of activity on most or all days.  Do not smoke. It may also be helpful to avoid exposure to secondhand smoke.  Limit alcohol use. Moderate alcohol use is considered to be:  No more than 2 drinks per day for men.  No more than 1 drink per day for nonpregnant women.  Eat healthy foods. This involves:  Eating 5 or more servings of fruits and vegetables a day.  Making dietary changes that address high blood pressure (hypertension), high cholesterol, diabetes, or obesity.  Manage your cholesterol levels.  Making food choices that are high in fiber and low in saturated fat, trans fat, and cholesterol may control cholesterol levels.  Take any prescribed medicines to control cholesterol as directed by your health care provider.  Manage your diabetes.  Controlling your carbohydrate and sugar intake is recommended to manage diabetes.  Take any prescribed medicines to control diabetes as directed by your health care provider.  Control your hypertension.  Making food choices that are low in salt (sodium), saturated fat, trans fat, and cholesterol is recommended to manage hypertension.  Ask your health care provider if you need treatment to lower your blood pressure. Take any prescribed medicines to control hypertension as directed by your health care provider.  If you are 18-39 years of age, have your blood pressure checked every 3-5 years. If you are 40 years of age or older, have your blood pressure checked every year.  Maintain a healthy weight.  Reducing calorie intake and making food choices that are low in sodium, saturated fat, trans fat, and cholesterol are recommended to manage  weight.  Stop drug abuse.  Avoid taking birth control pills.  Talk to your health care provider about the risks of taking birth control pills if you are over 35 years old, smoke, get migraines, or have ever had a blood clot.  Get evaluated for sleep disorders (sleep apnea).  Talk to your health care provider about getting a sleep evaluation if you snore a lot or have excessive sleepiness.  Take medicines only as directed by your health care provider.  For some people, aspirin or blood thinners (anticoagulants) are helpful in reducing the risk of forming abnormal blood clots that can lead to stroke. If you have the irregular heart rhythm of atrial fibrillation, you should be on a blood thinner unless there is a good reason you cannot take them.  Understand all your medicine instructions.  Make sure that other conditions (such as anemia or atherosclerosis) are addressed. SEEK IMMEDIATE MEDICAL CARE IF:   You have sudden weakness or numbness of the face, arm, or leg, especially on one side of the body.  Your face or eyelid droops to one side.  You have sudden confusion.  You have trouble speaking (aphasia) or understanding.  You have sudden trouble seeing in one or both eyes.  You have sudden trouble walking.  You have dizziness.  You have a loss of balance or coordination.  You have a sudden, severe headache with no known cause.  You have new chest pain or an irregular heartbeat. Any of these symptoms may represent a serious problem that is an emergency. Do not wait to see if the symptoms will   go away. Get medical help at once. Call your local emergency services (911 in [place] Do not drive yourself to the hospital.   This information is not intended to replace advice given to you by your health care provider. Make sure you discuss any questions you have with your health care provider.   Document Released: 03/17/2004 Document Revised: 02/28/2014 Document Reviewed:  08/10/2012 Elsevier Interactive Patient Education 2016 [employer]  

## 2015-11-17 NOTE — Progress Notes (Signed)
*  PRELIMINARY RESULTS* Echocardiogram 2D Echocardiogram has been performed.  Stacey DrainWhite, Karole Oo J 11/17/2015, 1:09 PM

## 2015-11-17 NOTE — Discharge Summary (Signed)
Physician Discharge Summary  George Cooper WJX:[id number] DOB: [birth year] DOA: 11/16/2015  PCP: Lenise Herald, PA-C  Admit date: 11/16/2015 Discharge date: 11/17/2015  Time spent: 45 minutes  Recommendations for Outpatient Follow-up:  -Will be discharged home today. -Advised to follow up with PCP in 2 weeks.  Discharge Diagnoses:  Principal Problem:   Left-sided weakness Active Problems:   PAD (peripheral artery disease), s/p athrectomy, PTA/ stent Rt. SFA  09/18/2012   Hyperlipidemia   Headache   Discharge Condition: Stable and improved  Filed Weights   11/16/15 1032 11/16/15 1614  Weight: 75.8 kg (167 lb) 74.6 kg (164 lb 8 oz)    History of present illness:  George Cooper is a 60 y.o. male with h/o PAD who comes in today with above complaints. He started experiencing symptoms at around 8:30 am when he noticed that he had to physically hold his left leg in order to move it. As the day progressed weakness extended to his left arm and then at around lunchtime he noticed tingling over his left face. At that point he decided to come into the hospital for evaluation. He notes that yesterday he had a HA and used to suffer from migraines as a child and teenager. CT Head negative. MRI negative. Admission requested as symptoms persist.  Hospital Course:   Left-sided weakness -Diagnosis remains uncertain: TIA vs complicated migraine. -MRI brain without evidence for CVA. -Seen by Dr. Gerilyn Pilgrim who recommended work up for TIA. -ECHO: Left ventricle: The cavity size was normal. Wall thickness was   increased in a pattern of mild LVH. Systolic function was normal.   The estimated ejection fraction was in the range of 60% to 65%.   Wall motion was normal; there were no regional wall motion   abnormalities. Doppler parameters are consistent with abnormal   left ventricular relaxation (grade 1 diastolic dysfunction).   Doppler parameters are consistent with high ventricular  filling   pressure. -CT angio neck: IMPRESSION: CTA NECK: Atherosclerosis without hemodynamically significant stenosis of internal carotid artery origins. -To continue double anticoagulation with ASA and plavix.  PAD -Continue ASA/plavix.  Procedures:  As above  Consultations:  Neurology  Discharge Instructions  Discharge Instructions    Diet - low sodium heart healthy    Complete by:  As directed    Increase activity slowly    Complete by:  As directed        Medication List    TAKE these medications   aspirin 325 MG EC tablet Take 1 tablet (325 mg total) by mouth daily.   clopidogrel 75 MG tablet Commonly known as:  PLAVIX TAKE 1 TABLET WITH BREAKFAST.   ibuprofen 200 MG tablet Commonly known as:  ADVIL,MOTRIN Take 400 mg by mouth every 6 (six) hours as needed for mild pain.   tamsulosin 0.4 MG Caps capsule Commonly known as:  FLOMAX Take 0.4 mg by mouth daily after supper.      No Known Allergies Follow-up Information    MANN, BENJAMIN, PA-C. Schedule an appointment as soon as possible for a visit in 2 week(s).   Specialties:  Physician Assistant, Internal Medicine Contact information: [address] [place] [address] [phone]            The results of significant diagnostics from this hospitalization (including imaging, microbiology, ancillary and laboratory) are listed below for reference.    Significant Diagnostic Studies: Ct Angio Head W Or Wo Contrast  Result Date: 11/17/2015 CLINICAL DATA:  LEFT  extremity weakness this morning, now improved. History of hypertension, Lyme disease, hyperlipidemia. EXAM: CT ANGIOGRAPHY HEAD AND NECK TECHNIQUE: Multidetector CT imaging of the head and neck was performed using the standard protocol during bolus administration of intravenous contrast. Multiplanar CT image reconstructions and MIPs were obtained to evaluate the vascular anatomy. Carotid stenosis measurements (when applicable) are  obtained utilizing NASCET criteria, using the distal internal carotid diameter as the denominator. CONTRAST:  75 cc Isovue 370 COMPARISON:  MRI of the brain November 16, 2015 at 1247 hours FINDINGS: CTA NECK AORTIC ARCH: Normal appearance of the thoracic arch, mild calcific atherosclerosis. Approximate 50% stenosis LEFT Common carotid artery origin due to eccentric intimal thickening. Mild stenosis LEFT Common carotid artery origin with proximal tandem moderate stenosis due to intimal thickening. RIGHT CAROTID SYSTEM: Mild eccentric calcific atherosclerosis RIGHT Common carotid artery. Atherosclerosis without hemodynamically significant stenosis internal carotid artery origin by NASCET criteria. LEFT CAROTID SYSTEM: Luminal irregularity in eccentric intimal thickening results in approximately 50% stenosis mid to distal LEFT Common carotid artery. Eccentric intimal thickening calcific atherosclerosis without hemodynamically significant stenosis LEFT internal carotid artery origin by NASCET criteria. VERTEBRAL ARTERIES:Codominant vertebral artery's. Severe stenosis RIGHT vertebral artery origin. Mild luminal irregularity of the bilateral vertebral arteries which remain patent throughout the course. SKELETON: No acute osseous process though bone windows have not been submitted. Patient is edentulous. Moderate C4-5 and severe C5-6 degenerative discs moderate RIGHT grand LEFT C4-5 and moderate to severe LEFT greater than RIGHT C5-6 neural foraminal narrowing. OTHER NECK: Soft tissues of the neck are non-acute though, not tailored for evaluation. Centrilobular emphysema included lung apices with 12 x 5 mm sub solid pleural based pulmonary nodule, RIGHT upper lobe. 4 mm LEFT thyroid nodule, below size followup recommendations. CTA HEAD ANTERIOR CIRCULATION: Normal appearance of the cervical internal carotid arteries, petrous, cavernous and supra clinoid internal carotid arteries. Widely patent anterior communicating  artery. Normal appearance of the anterior and middle cerebral arteries. No large vessel occlusion, hemodynamically significant stenosis, dissection, luminal irregularity, contrast extravasation or aneurysm. POSTERIOR CIRCULATION: Normal appearance of the vertebral arteries, vertebrobasilar junction and basilar artery, as well as main branch vessels. Normal appearance of the posterior cerebral arteries. Robust LEFT posterior communicating artery present. No large vessel occlusion, hemodynamically significant stenosis, dissection, luminal irregularity, contrast extravasation or aneurysm. VENOUS SINUSES: Major dural venous sinuses are patent though not tailored for evaluation on this angiographic examination. ANATOMIC VARIANTS: None. DELAYED PHASE: No abnormal intracranial enhancement. IMPRESSION: CTA NECK: Atherosclerosis without hemodynamically significant stenosis of internal carotid artery origins. Severe stenosis RIGHT vertebral artery origin. Moderate tandem stenosis of proximal LEFT subclavian artery. **An incidental finding of potential clinical significance has been found. Findings 5 x 12 mm RIGHT upper lobe pleural based nodule. Recommend follow-up CT at 6-12 months to confirm persistence.** CTA HEAD:  No emergent large vessel occlusion or severe stenosis. Electronically Signed   By: Awilda Metroourtnay  Bloomer M.D.   On: 11/17/2015 00:55   Ct Head Wo Contrast  Result Date: 11/16/2015 CLINICAL DATA:  LEFT arm numbness. EXAM: CT HEAD WITHOUT CONTRAST TECHNIQUE: Contiguous axial images were obtained from the base of the skull through the vertex without intravenous contrast. COMPARISON:  None. FINDINGS: Brain: No acute intracranial hemorrhage. No focal mass lesion. No CT evidence of acute infarction. No midline shift or mass effect. No hydrocephalus. Basilar cisterns are patent. Vascular: No hyperdense vessel or unexpected calcification. Skull: Normal. Negative for fracture or focal lesion. Sinuses/Orbits: Paranasal  sinuses and mastoid air cells are clear. Orbits are clear.  Other: None IMPRESSION: No acute intracranial findings. No CT evidence infarction. No intracranial hemorrhage. Electronically Signed   By: Genevive Bi M.D.   On: 11/16/2015 11:44   Ct Angio Neck W Or Wo Contrast  Result Date: 11/17/2015 CLINICAL DATA:  LEFT extremity weakness this morning, now improved. History of hypertension, Lyme disease, hyperlipidemia. EXAM: CT ANGIOGRAPHY HEAD AND NECK TECHNIQUE: Multidetector CT imaging of the head and neck was performed using the standard protocol during bolus administration of intravenous contrast. Multiplanar CT image reconstructions and MIPs were obtained to evaluate the vascular anatomy. Carotid stenosis measurements (when applicable) are obtained utilizing NASCET criteria, using the distal internal carotid diameter as the denominator. CONTRAST:  75 cc Isovue 370 COMPARISON:  MRI of the brain November 16, 2015 at 1247 hours FINDINGS: CTA NECK AORTIC ARCH: Normal appearance of the thoracic arch, mild calcific atherosclerosis. Approximate 50% stenosis LEFT Common carotid artery origin due to eccentric intimal thickening. Mild stenosis LEFT Common carotid artery origin with proximal tandem moderate stenosis due to intimal thickening. RIGHT CAROTID SYSTEM: Mild eccentric calcific atherosclerosis RIGHT Common carotid artery. Atherosclerosis without hemodynamically significant stenosis internal carotid artery origin by NASCET criteria. LEFT CAROTID SYSTEM: Luminal irregularity in eccentric intimal thickening results in approximately 50% stenosis mid to distal LEFT Common carotid artery. Eccentric intimal thickening calcific atherosclerosis without hemodynamically significant stenosis LEFT internal carotid artery origin by NASCET criteria. VERTEBRAL ARTERIES:Codominant vertebral artery's. Severe stenosis RIGHT vertebral artery origin. Mild luminal irregularity of the bilateral vertebral arteries which remain  patent throughout the course. SKELETON: No acute osseous process though bone windows have not been submitted. Patient is edentulous. Moderate C4-5 and severe C5-6 degenerative discs moderate RIGHT grand LEFT C4-5 and moderate to severe LEFT greater than RIGHT C5-6 neural foraminal narrowing. OTHER NECK: Soft tissues of the neck are non-acute though, not tailored for evaluation. Centrilobular emphysema included lung apices with 12 x 5 mm sub solid pleural based pulmonary nodule, RIGHT upper lobe. 4 mm LEFT thyroid nodule, below size followup recommendations. CTA HEAD ANTERIOR CIRCULATION: Normal appearance of the cervical internal carotid arteries, petrous, cavernous and supra clinoid internal carotid arteries. Widely patent anterior communicating artery. Normal appearance of the anterior and middle cerebral arteries. No large vessel occlusion, hemodynamically significant stenosis, dissection, luminal irregularity, contrast extravasation or aneurysm. POSTERIOR CIRCULATION: Normal appearance of the vertebral arteries, vertebrobasilar junction and basilar artery, as well as main branch vessels. Normal appearance of the posterior cerebral arteries. Robust LEFT posterior communicating artery present. No large vessel occlusion, hemodynamically significant stenosis, dissection, luminal irregularity, contrast extravasation or aneurysm. VENOUS SINUSES: Major dural venous sinuses are patent though not tailored for evaluation on this angiographic examination. ANATOMIC VARIANTS: None. DELAYED PHASE: No abnormal intracranial enhancement. IMPRESSION: CTA NECK: Atherosclerosis without hemodynamically significant stenosis of internal carotid artery origins. Severe stenosis RIGHT vertebral artery origin. Moderate tandem stenosis of proximal LEFT subclavian artery. **An incidental finding of potential clinical significance has been found. Findings 5 x 12 mm RIGHT upper lobe pleural based nodule. Recommend follow-up CT at 6-12 months  to confirm persistence.** CTA HEAD:  No emergent large vessel occlusion or severe stenosis. Electronically Signed   By: Awilda Metro M.D.   On: 11/17/2015 00:55   Mr Brain Wo Contrast (neuro Protocol)  Result Date: 11/16/2015 CLINICAL DATA:  Left-sided weakness. EXAM: MRI HEAD WITHOUT CONTRAST TECHNIQUE: Multiplanar, multiecho pulse sequences of the brain and surrounding structures were obtained without intravenous contrast. COMPARISON:  Head CT 11/16/2015 FINDINGS: Brain: No acute infarction, hemorrhage, hydrocephalus, extra-axial collection or  mass lesion. 2 tiny remote infarcts in the peripheral right cerebellum. No notable white matter disease. Mild generalized volume loss, age congruent. Vascular: Normal flow voids. Skull and upper cervical spine: Normal marrow signal. Sinuses/Orbits: Negative. IMPRESSION: No acute finding, including infarct.  No explanation for weakness. Electronically Signed   By: Marnee Spring M.D.   On: 11/16/2015 13:13    Microbiology: No results found for this or any previous visit (from the past 240 hour(s)).   Labs: Basic Metabolic Panel:  Recent Labs Lab 11/16/15 1043 11/16/15 1054 11/17/15 0530  NA 136 140 136  K 4.3 4.3 4.1  CL 107 106 105  CO2 23  --  24  GLUCOSE 107* 103* 111*  BUN 16 15 17   CREATININE 1.20 1.20 1.29*  CALCIUM 9.0  --  9.1   Liver Function Tests:  Recent Labs Lab 11/16/15 1043  AST 20  ALT 19  ALKPHOS 70  BILITOT 0.4  PROT 7.6  ALBUMIN 4.4   No results for input(s): LIPASE, AMYLASE in the last 168 hours. No results for input(s): AMMONIA in the last 168 hours. CBC:  Recent Labs Lab 11/16/15 1043 11/16/15 1054 11/17/15 0530  WBC 7.5  --  7.1  NEUTROABS 4.4  --   --   HGB 14.1 13.9 14.3  HCT 40.1 41.0 41.9  MCV 85.0  --  85.5  PLT 223  --  230   Cardiac Enzymes: No results for input(s): CKTOTAL, CKMB, CKMBINDEX, TROPONINI in the last 168 hours. BNP: BNP (last 3 results) No results for input(s): BNP in  the last 8760 hours.  ProBNP (last 3 results) No results for input(s): PROBNP in the last 8760 hours.  CBG: No results for input(s): GLUCAP in the last 168 hours.     SignedChaya Jan  [facility] Pager: [phone] 11/17/2015, 8:35 PM

## 2015-11-17 NOTE — Care Management Note (Signed)
Case Management Note  Patient Details  Name: George Cooper MRN: [id number] Date of Birth: September 18, 1955  Subjective/Objective:                  Pt admitted for left sided weakness. He now has no deficits. He is from home, he is ind with ADL's. [facility]. He is supposed to f/u with surgeon in next couple weeks. [facility]. He has PCP. He plans on returning home with self care.   Action/Plan: No CM needs anticipated.   Expected Discharge Date:                  Expected Discharge Plan:  Home/Self Care  In-House Referral:  NA  Discharge planning Services  CM Consult  Post Acute Care Choice:  NA Choice offered to:  NA  DME Arranged:    DME Agency:     HH Arranged:    HH Agency:     Status of Service:  Completed, signed off  If discussed at [employer] Length of Stay Meetings, dates discussed:    Additional Comments:  Malcolm MetroChildress, Sosaia Pittinger Demske, RN 11/17/2015, 2:39 PM

## 2015-11-18 LAB — HOMOCYSTEINE: Homocysteine: 12.2 umol/L (ref 0.0–15.0)

## 2015-11-18 LAB — HIV ANTIBODY (ROUTINE TESTING W REFLEX): HIV Screen 4th Generation wRfx: NONREACTIVE

## 2015-11-18 LAB — RPR: RPR Ser Ql: NONREACTIVE

## 2015-11-19 ENCOUNTER — Ambulatory Visit (HOSPITAL_COMMUNITY)
Admission: RE | Admit: 2015-11-19 | Discharge: 2015-11-19 | Disposition: A | Discharge status: Home / Self Care | Payer: Self-pay | Source: Ambulatory Visit | Attending: Cardiovascular Disease | Admitting: Cardiovascular Disease

## 2015-11-19 DIAGNOSIS — I743 Embolism and thrombosis of arteries of the lower extremities: Secondary | ICD-10-CM | POA: Insufficient documentation

## 2015-11-19 DIAGNOSIS — I739 Peripheral vascular disease, unspecified: Secondary | ICD-10-CM

## 2015-11-19 DIAGNOSIS — Z95820 Peripheral vascular angioplasty status with implants and grafts: Secondary | ICD-10-CM | POA: Insufficient documentation

## 2015-11-27 ENCOUNTER — Encounter: Payer: Self-pay | Admitting: Cardiovascular Disease

## 2015-11-27 ENCOUNTER — Ambulatory Visit (INDEPENDENT_AMBULATORY_CARE_PROVIDER_SITE_OTHER): Payer: Self-pay | Admitting: Cardiovascular Disease

## 2015-11-27 VITALS — BP 118/78 | HR 66 | Ht 70.0 in | Wt 165.2 lb

## 2015-11-27 DIAGNOSIS — I739 Peripheral vascular disease, unspecified: Secondary | ICD-10-CM

## 2015-11-27 DIAGNOSIS — E785 Hyperlipidemia, unspecified: Secondary | ICD-10-CM

## 2015-11-27 DIAGNOSIS — Z79899 Other long term (current) drug therapy: Secondary | ICD-10-CM

## 2015-11-27 DIAGNOSIS — I4891 Unspecified atrial fibrillation: Secondary | ICD-10-CM

## 2015-11-27 NOTE — Patient Instructions (Signed)
Medication Instructions:  NO CHANGES.  Labwork: Your physician recommends that you return for lab work in: January 2018. YOU WILL NEED TO BE FASTING. The lab can be found on the FIRST FLOOR of out building in [address].   Testing/Procedures: Your physician has recommended that you wear an event monitor. Event monitors are medical devices that record the heart's electrical activity. Doctors most often us these monitors to diagnose arrhythmias. Arrhythmias are problems with the speed or rhythm of the heartbeat. The monitor is a small, portable device. You can wear one while you do your normal daily activities. This is usually used to diagnose what is causing palpitations/syncope (passing out).  30 DAY EVENT.    Your physician has requested that you have a lower extremity arterial doppler- During this test, ultrasound is used to evaluate arterial blood flow in the legs. Allow approximately one hour for this exam.  IN 6 MONTHS.   Follow-Up: Your physician wants you to follow-up in: 6 MONTHS WITH DR Allyson SabalBERRY.  You will receive a reminder letter in the mail two months in advance. If you don't receive a letter, please call our office to schedule the follow-up appointment.   If you need a refill on your cardiac medications before your next appointment, please call your pharmacy.

## 2015-11-27 NOTE — Assessment & Plan Note (Signed)
History of peripheral arterial disease status post stenting of his right SFA in the past. He's had multiple procedures on his right lower extremity. Recently he had noticed increasing right calf claudication with a decline in his right ABI and a new high-frequency signal. I angiogram to him on 11/09/15 confirming a high-frequency signal in his distal right SFA with a patent stent. [facility] followed by drug-eluting balloon angioplasty. There was a small linear dissection that was not flow-limiting. His follow-up Doppler studies performed 10 days later revealed normalization of his ABI. His claudication has resolved.

## 2015-11-27 NOTE — Assessment & Plan Note (Signed)
History of hyperlipidemia intolerant to statin therapy recently started on Repatha . His most recent lipid profile performed 08/20/15 revealed LDL 173 with an HDL 35. We will recheck a lipid profile after the first year.

## 2015-11-27 NOTE — Assessment & Plan Note (Signed)
He was recently admitted with left sided weakness 11/16/15 for one day. His symptoms resolved. Head CT and MRI were negative. CTA showed no evidence of carotid disease. 2-D echo was essentially normal as well. I'm going to obtain a 30 day event monitor to rule out occult PAF as a cause.

## 2015-11-27 NOTE — Progress Notes (Signed)
11/27/2015 George HammersWilliam L Cooper   [birth year]  [id number]  Primary Physician Lenise HeraldMANN, BENJAMIN, PA-C Primary Cardiologist: Runell GessJonathan J Divina Neale MD Roseanne RenoFACP, FACC, FAHA, FSCAI  HPI:  [name] is a very pleasant 60 year old thin appearing married Caucasian male father of 2, grandfather of 2 grandchildren works as an Personnel officerelectrician. He was referred by Dr. Yetta NumbersBill McGough , [facility] [place] for evaluation of dizziness and carotid artery disease. I last saw him in the office 10/09/15.He denies chest pain or shortness of breath. He did complain however of right calf claudication. His cardiovascular risk profile is positive for a 20-pack-year history of tobacco abuse discontinued 10/14. He is not diabetic, hypertensive or hyperlipidemic. His mother did have bypass surgery in her 6480s and his sister has had coronary stents as well.A stress test which was normal. Lower extremity arterial Dopplers revealed a high-frequency signal in the proximal segment of his right SFA. On 09/18/12 the patient underwent peripheral angiography, directional atherectomy, PTA and stenting of a high-grade segmental proximal right SFA stenosis. He had excellent angiographic and clinical result. His Doppler show a widely patent proximal right SFA and his claudication has resolved. His lower extremity arterial Doppler studies performed 05/14/13 revealed a widely patent stent with ABIs 1 bilaterally. He complains of mild right calf claudication with significant exertion. Since I saw him last he remained stable. He denies chest pain, shortness of breath . Lower extremity Dopplers performed 08/27/15 revealed a decline in his right ABI from one down to 0.91 with a new high-frequency signal in his distal right SFA. He underwent peripheral angiography by myself 11/09/15 feeling a patent mid right SFA stent with a 95% stenosis just beyond this. I performed one directional atherectomy followed by drug-eluting balloon angioplasty with excellent  angiographic and clinical result. His claudication has completely resolved as Dopplers performed on 11/11/15 have normalized. He was admitted to the hospital on 11/16/15 with a transient neurologic event with negative workup.  Current Outpatient Prescriptions  Medication Sig Dispense Refill  . aspirin EC 325 MG EC tablet Take 1 tablet (325 mg total) by mouth daily. 30 tablet   . clopidogrel (PLAVIX) 75 MG tablet TAKE 1 TABLET WITH BREAKFAST. 30 tablet 6  . naproxen sodium (ANAPROX) 220 MG tablet Take 220 mg by mouth daily as needed.    . tamsulosin (FLOMAX) 0.4 MG CAPS capsule Take 0.4 mg by mouth daily after supper.      No current facility-administered medications for this visit.     No Known Allergies  Social History   Social History  . Marital status: Married    Spouse name: N/A  . Number of children: N/A  . Years of education: N/A   Occupational History  . Not on file.   Social History Main Topics  . Smoking status: Former Smoker    Packs/day: 0.20    Years: 40.00    Types: Cigarettes    Quit date: 11/29/2012  . Smokeless tobacco: Never Used     Comment: trying to quit, smokes 2-3 daily  . Alcohol use No  . Drug use:     Types: Marijuana     Comment: "every once in a while"  . Sexual activity: Not on file   Other Topics Concern  . Not on file   Social History Narrative  . No narrative on file     Review of Systems: General: negative for chills, fever, night sweats or weight changes.  Cardiovascular: negative for chest pain, dyspnea on  exertion, edema, orthopnea, palpitations, paroxysmal nocturnal dyspnea or shortness of breath Dermatological: negative for rash Respiratory: negative for cough or wheezing Urologic: negative for hematuria Abdominal: negative for nausea, vomiting, diarrhea, bright red blood per rectum, melena, or hematemesis Neurologic: negative for visual changes, syncope, or dizziness All other systems reviewed and are otherwise negative except  as noted above.    Blood pressure 118/78, pulse 66, height 5\' 10"  (1.778 m), weight 165 lb 3.2 oz (74.9 kg).  General appearance: alert and no distress Neck: no adenopathy, no carotid bruit, no JVD, supple, symmetrical, trachea midline and thyroid not enlarged, symmetric, no tenderness/mass/nodules Lungs: clear to auscultation bilaterally Heart: regular rate and rhythm, S1, S2 normal, no murmur, click, rub or gallop Extremities: extremities normal, atraumatic, no cyanosis or edema  EKG sinus rhythm at 63 without ST or T-wave changes. I personally reviewed this EKG  ASSESSMENT AND PLAN:   PAD (peripheral artery disease), s/p athrectomy, PTA/ stent Rt. SFA  09/18/2012 History of peripheral arterial disease status post stenting of his right SFA in the past. He's had multiple procedures on his right lower extremity. Recently he had noticed increasing right calf claudication with a decline in his right ABI and a new high-frequency signal. I angiogram to him on 11/09/15 confirming a high-frequency signal in his distal right SFA with a patent stent. I performed [facility] one directional atherectomy followed by drug-eluting balloon angioplasty. There was a small linear dissection that was not flow-limiting. His follow-up Doppler studies performed 10 days later revealed normalization of his ABI. His claudication has resolved.  Hyperlipidemia History of hyperlipidemia intolerant to statin therapy recently started on Repatha . His most recent lipid profile performed 08/20/15 revealed LDL 173 with an HDL 35. We will recheck a lipid profile after the first year.  CVA (cerebral infarction) He was recently admitted with left sided weakness 11/16/15 for one day. His symptoms resolved. Head CT and MRI were negative. CTA showed no evidence of carotid disease. 2-D echo was essentially normal as well. I'm going to obtain a 30 day event monitor to rule out occult PAF as a cause.      Runell Gess MD  FACP,FACC,FAHA, [facility] 11/27/2015 11:31 AM

## 2015-12-08 ENCOUNTER — Encounter: Payer: Self-pay | Admitting: *Deleted

## 2015-12-08 NOTE — Progress Notes (Signed)
Patient ID: Sherral HammersWilliam L Loper, male   DOB: 08/24/1955, 60 y.o.   MRN: [id number] Patient given [employer] hardship application for a cardiac event monitor.  A monitor will be shipped directly to the patient pending approval of his hardship application.

## 2015-12-14 ENCOUNTER — Telehealth: Payer: Self-pay | Admitting: Pharmacist

## 2015-12-14 NOTE — Telephone Encounter (Signed)
Opened in error

## 2016-01-04 ENCOUNTER — Telehealth: Payer: Self-pay | Admitting: Pharmacist

## 2016-01-04 NOTE — Telephone Encounter (Signed)
LM - labs after starting Repatha. Will need him to come by to sign Repatha renewal form for [employer].

## 2016-03-10 ENCOUNTER — Other Ambulatory Visit: Payer: Self-pay | Admitting: Cardiovascular Disease

## 2016-03-23 ENCOUNTER — Other Ambulatory Visit (HOSPITAL_COMMUNITY): Payer: Self-pay | Admitting: Internal Medicine

## 2016-03-23 ENCOUNTER — Telehealth: Payer: Self-pay | Admitting: Pharmacist

## 2016-03-23 ENCOUNTER — Ambulatory Visit (HOSPITAL_COMMUNITY)
Admission: RE | Admit: 2016-03-23 | Discharge: 2016-03-23 | Disposition: A | Discharge status: Home / Self Care | Payer: Self-pay | Source: Ambulatory Visit | Attending: Internal Medicine | Admitting: Internal Medicine

## 2016-03-23 DIAGNOSIS — M79605 Pain in left leg: Secondary | ICD-10-CM

## 2016-03-23 DIAGNOSIS — I739 Peripheral vascular disease, unspecified: Secondary | ICD-10-CM | POA: Insufficient documentation

## 2016-03-23 NOTE — Telephone Encounter (Signed)
Patient on Repatha 140mg  q 2 weeks since June/2017.  After dose  administration 2 weeks ago ( January 2018), patient experienced severe pain and swelling on injection site .  Patient will hold  Repatha until leg assessment by PCP. Will continue taking low dose pravastatin 10mg  daily as prescribed by PCP and call clinic back if/when Repatha resumed.  Benefits of low LDL and cardiac prevention discussed with patient during cal as welll.  Also instructed to repeat Lipid panel in 3 months if Repatha not resumed.

## 2016-03-24 ENCOUNTER — Telehealth: Payer: Self-pay | Admitting: Pharmacist

## 2016-03-24 NOTE — Telephone Encounter (Signed)
Patient called to request result from most recent blood work.   [facility] contacted and results obtained by fax.  LDL = 32 ; LFT= within normal limits  Patient called back. Results reported.

## 2016-03-24 NOTE — Telephone Encounter (Signed)
PCP assessment completed. Medication given in the muscle instead of fatty tissue and caused irritation.  Patient will resume Repatha as prescribed

## 2016-06-10 ENCOUNTER — Ambulatory Visit (INDEPENDENT_AMBULATORY_CARE_PROVIDER_SITE_OTHER): Payer: Self-pay | Admitting: Cardiovascular Disease

## 2016-06-10 ENCOUNTER — Encounter: Payer: Self-pay | Admitting: Cardiovascular Disease

## 2016-06-10 VITALS — BP 136/68 | HR 60 | Ht 70.0 in | Wt 167.0 lb

## 2016-06-10 DIAGNOSIS — I739 Peripheral vascular disease, unspecified: Secondary | ICD-10-CM

## 2016-06-10 DIAGNOSIS — I779 Disorder of arteries and arterioles, unspecified: Secondary | ICD-10-CM

## 2016-06-10 DIAGNOSIS — E7800 Pure hypercholesterolemia, unspecified: Secondary | ICD-10-CM

## 2016-06-10 NOTE — Assessment & Plan Note (Signed)
History of hyperlipidemia currently controlled on Repatha  with recent lipid profile performed 03/17/16 revealing a total cholesterol 86, LDL 32 and HDL of 40.

## 2016-06-10 NOTE — Patient Instructions (Addendum)
Medication Instructions: Your physician recommends that you continue on your current medications as directed. Please refer to the Current Medication list given to you today.   Testing/Procedures: Your physician has requested that you have a lower extremity arterial duplex. During this test, ultrasound is used to evaluate arterial blood flow in the legs. Allow one hour for this exam. There are no restrictions or special instructions.  Your physician has requested that you have an ankle brachial index (ABI). During this test an ultrasound and blood pressure cuff are used to evaluate the arteries that supply the arms and legs with blood. Allow thirty minutes for this exam. There are no restrictions or special instructions.  Your physician has requested that you have a carotid duplex. This test is an ultrasound of the carotid arteries in your neck. It looks at blood flow through these arteries that supply the brain with blood. Allow one hour for this exam. There are no restrictions or special instructions.   Follow-Up: Your physician wants you to follow-up in: 1 year with Dr. Berry. You will receive a reminder letter in the mail two months in advance. If you don't receive a letter, please call our office to schedule the follow-up appointment.  If you need a refill on your cardiac medications before your next appointment, please call your pharmacy.  

## 2016-06-10 NOTE — Assessment & Plan Note (Signed)
History of mild carotid disease by duplex 08/27/15 with mild right moderate right ICA stenosis. We'll we will repeat this in one year.

## 2016-06-10 NOTE — Progress Notes (Signed)
06/10/2016 George Cooper   1955/06/08  [id number]  Primary Physician Colette Ribas, MD Primary Cardiologist: Runell Gess MD Roseanne Reno  HPI:  Mr. Helfand is a very pleasant 61 year old thin appearing married Caucasian male father of 2, grandfather of 2 grandchildren works as an Personnel officer. He was referred by Dr. Yetta Numbers , at [facility], in [place] for evaluation of dizziness and carotid artery disease. I last saw him in the office 11/27/15.He denies chest pain or shortness of breath. He did complain however of right calf claudication. His cardiovascular risk profile is positive for a 20-pack-year history of tobacco abuse discontinued 10/14. He is not diabetic, hypertensive or hyperlipidemic. His mother did have bypass surgery in her 35s and his sister has had coronary stents as well.A stress test which was normal. Lower extremity arterial Dopplers revealed a high-frequency signal in the proximal segment of his right SFA. On 09/18/12 the patient underwent peripheral angiography, directional atherectomy, PTA and stenting of a high-grade segmental proximal right SFA stenosis. He had excellent angiographic and clinical result. His Doppler show a widely patent proximal right SFA and his claudication has resolved. His lower extremity arterial Doppler studies performed 05/14/13 revealed a widely patent stent with ABIs 1 bilaterally. He complains of mild right calf claudication with significant exertion. Since I saw him last he remained stable. He denies chest pain, shortness of breath . Lower extremity Dopplers performed 08/27/15 revealed a decline in his right ABI from one down to 0.91 with a new high-frequency signal in his distal right SFA. He underwent peripheral angiography by myself 11/09/15 feeling a patent mid right SFA stent with a 95% stenosis just beyond this. I performed one directional atherectomy followed by drug-eluting balloon angioplasty with excellent  angiographic and clinical result. His claudication has completely resolved as Dopplers performed on 11/11/15 have normalized. He was admitted to the hospital on 11/16/15 with a transient neurologic event with negative workup. Since I saw him in the office in October of last year he was remained stable. He denies chest pain, shortness of breath or claudication. He is on Repatha  with an excellent clinical result. His most recent lipid profile performed 03/17/16 revealed total cholesterol of 86 and LDL 32 and HDL 40.   Current Outpatient Prescriptions  Medication Sig Dispense Refill  . aspirin EC 325 MG EC tablet Take 1 tablet (325 mg total) by mouth daily. 30 tablet   . clopidogrel (PLAVIX) 75 MG tablet TAKE 1 TABLET WITH BREAKFAST. 30 tablet 9  . Evolocumab (REPATHA [place]) Inject into the skin. TAKES EVERY OTHER WEEK.    . naproxen sodium (ANAPROX) 220 MG tablet Take 220 mg by mouth daily as needed.    . pravastatin (PRAVACHOL) 20 MG tablet Take 10 mg by mouth daily.    . tamsulosin (FLOMAX) 0.4 MG CAPS capsule Take 0.4 mg by mouth daily after supper.      No current facility-administered medications for this visit.     No Known Allergies  Social History   Social History  . Marital status: Married    Spouse name: N/A  . Number of children: N/A  . Years of education: N/A   Occupational History  . Not on file.   Social History Main Topics  . Smoking status: Former Smoker    Packs/day: 0.20    Years: 40.00    Types: Cigarettes    Quit date: 11/29/2012  . Smokeless tobacco: Never Used  Comment: trying to quit, smokes 2-3 daily  . Alcohol use No  . Drug use: Yes    Types: Marijuana     Comment: "every once in a while"  . Sexual activity: Not on file   Other Topics Concern  . Not on file   Social History Narrative  . No narrative on file     Review of Systems: General: negative for chills, fever, night sweats or weight changes.  Cardiovascular: negative for chest pain,  dyspnea on exertion, edema, orthopnea, palpitations, paroxysmal nocturnal dyspnea or shortness of breath Dermatological: negative for rash Respiratory: negative for cough or wheezing Urologic: negative for hematuria Abdominal: negative for nausea, vomiting, diarrhea, bright red blood per rectum, melena, or hematemesis Neurologic: negative for visual changes, syncope, or dizziness All other systems reviewed and are otherwise negative except as noted above.    Blood pressure 136/68, pulse 60, height  (1.778 m), weight 167 lb (75.8 kg).  General appearance: alert and no distress Neck: no adenopathy, no carotid bruit, no JVD, supple, symmetrical, trachea midline and thyroid not enlarged, symmetric, no tenderness/mass/nodules Lungs: clear to auscultation bilaterally Heart: regular rate and rhythm, S1, S2 normal, no murmur, click, rub or gallop Extremities: extremities normal, atraumatic, no cyanosis or edema  EKG not performed today  ASSESSMENT AND PLAN:   Claudication (HCC) History of peripheral arterial disease status post directional atherectomy, PT and stenting of his right SFA 09/18/12. Because of Doppler suggesting restenosis and claudication he underwent repeat angiography by myself 11/09/15 revealing a widely patent right SFA stent with a 95% fairly focal stenosis just beyond this. I performed directional atherectomy followed by drug-eluting balloon angioplasty with an excellent clinical and angiographic result. His follow-up Dopplers performed 11/19/15 revealed a right ABI of 1.2 with a widely patent intervention site. He currently denies claudication.  Carotid artery disease History of mild carotid disease by duplex 08/27/15 with mild right moderate right ICA stenosis. We'll we will repeat this in one year.  Hyperlipidemia History of hyperlipidemia currently controlled on Repatha  with recent lipid profile performed 03/17/16 revealing a total cholesterol 86, LDL 32 and HDL of  40.      Runell Gess MD FACP,FACC,FAHA, [facility] 06/10/2016 5:23 PM

## 2016-06-10 NOTE — Assessment & Plan Note (Signed)
History of peripheral arterial disease status post directional atherectomy, PT and stenting of his right SFA 09/18/12. Because of Doppler suggesting restenosis and claudication he underwent repeat angiography by myself 11/09/15 revealing a widely patent right SFA stent with a 95% fairly focal stenosis just beyond this. I performed directional atherectomy followed by drug-eluting balloon angioplasty with an excellent clinical and angiographic result. His follow-up Dopplers performed 11/19/15 revealed a right ABI of 1.2 with a widely patent intervention site. He currently denies claudication.

## 2016-08-31 ENCOUNTER — Ambulatory Visit (HOSPITAL_COMMUNITY)
Admission: RE | Admit: 2016-08-31 | Discharge: 2016-08-31 | Disposition: A | Discharge status: Home / Self Care | Payer: Self-pay | Source: Ambulatory Visit | Attending: Cardiology | Admitting: Cardiology

## 2016-08-31 DIAGNOSIS — Y838 Other surgical procedures as the cause of abnormal reaction of the patient, or of later complication, without mention of misadventure at the time of the procedure: Secondary | ICD-10-CM | POA: Insufficient documentation

## 2016-08-31 DIAGNOSIS — T82856A Stenosis of peripheral vascular stent, initial encounter: Secondary | ICD-10-CM | POA: Insufficient documentation

## 2016-08-31 DIAGNOSIS — E785 Hyperlipidemia, unspecified: Secondary | ICD-10-CM | POA: Insufficient documentation

## 2016-08-31 DIAGNOSIS — R938 Abnormal findings on diagnostic imaging of other specified body structures: Secondary | ICD-10-CM | POA: Insufficient documentation

## 2016-08-31 DIAGNOSIS — Z87891 Personal history of nicotine dependence: Secondary | ICD-10-CM | POA: Insufficient documentation

## 2016-08-31 DIAGNOSIS — I6523 Occlusion and stenosis of bilateral carotid arteries: Secondary | ICD-10-CM | POA: Insufficient documentation

## 2016-08-31 DIAGNOSIS — I739 Peripheral vascular disease, unspecified: Secondary | ICD-10-CM

## 2016-08-31 DIAGNOSIS — I779 Disorder of arteries and arterioles, unspecified: Secondary | ICD-10-CM

## 2016-09-06 ENCOUNTER — Telehealth: Payer: Self-pay | Admitting: *Deleted

## 2016-09-06 NOTE — Telephone Encounter (Signed)
Left a message to call back and make a follow up appointment to discuss results.   Notes recorded by Runell GessBerry, Jonathan J, MD on 09/05/2016 at 2:23 PM EDT Return office visit with me to discuss results at next available

## 2016-09-15 ENCOUNTER — Other Ambulatory Visit: Payer: Self-pay | Admitting: Cardiovascular Disease

## 2016-09-15 DIAGNOSIS — I739 Peripheral vascular disease, unspecified: Principal | ICD-10-CM

## 2016-09-15 DIAGNOSIS — I779 Disorder of arteries and arterioles, unspecified: Secondary | ICD-10-CM

## 2016-09-27 ENCOUNTER — Ambulatory Visit (INDEPENDENT_AMBULATORY_CARE_PROVIDER_SITE_OTHER): Payer: Self-pay | Admitting: Cardiovascular Disease

## 2016-09-27 ENCOUNTER — Encounter: Payer: Self-pay | Admitting: Cardiovascular Disease

## 2016-09-27 VITALS — BP 144/74 | HR 64 | Ht 70.0 in | Wt 170.2 lb

## 2016-09-27 DIAGNOSIS — I779 Disorder of arteries and arterioles, unspecified: Secondary | ICD-10-CM

## 2016-09-27 DIAGNOSIS — I739 Peripheral vascular disease, unspecified: Secondary | ICD-10-CM

## 2016-09-27 DIAGNOSIS — E7800 Pure hypercholesterolemia, unspecified: Secondary | ICD-10-CM

## 2016-09-27 NOTE — Assessment & Plan Note (Signed)
History of hyperlipidemia on Repatha followed by his PCP. °

## 2016-09-27 NOTE — Progress Notes (Signed)
09/27/2016 MC[facility]AE SPECIALE   Sep 09, 1955  [id number]  Primary Physician Assunta Found, MD Primary Cardiologist: Runell Gess MD Nicholes Calamity, [place]  HPI:  George Cooper is a 61 y.o. 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 He was referred by Dr. Yetta Numbers , at [facility], in [place] for evaluation of dizziness and carotid artery disease. I last saw him in the office 06/10/16.He denies chest pain or shortness of breath. He did complain however of right calf claudication. His cardiovascular risk profile is positive for a 20-pack-year history of tobacco abuse discontinued 10/14. He is not diabetic, hypertensive or hyperlipidemic. His mother did have bypass surgery in her 6s and his sister has had coronary stents as well.A stress test which was normal. Lower extremity arterial Dopplers revealed a high-frequency signal in the proximal segment of his right SFA. On 09/18/12 the patient underwent peripheral angiography, directional atherectomy, PTA and stenting of a high-grade segmental proximal right SFA stenosis. He had excellent angiographic and clinical result. His Doppler show a widely patent proximal right SFA and his claudication has resolved. His lower extremity arterial Doppler studies performed 05/14/13 revealed a widely patent stent with ABIs 1 bilaterally. He complains of mild right calf claudication with significant exertion. Since I saw him last he remained stable. He denies chest pain, shortness of breath . Lower extremity Dopplers performed 08/27/15 revealed a decline in his right ABI from one down to 0.91 with a new high-frequency signal in his distal right SFA. He underwent peripheral angiography by myself 11/09/15 feeling a patent mid right SFA stent with a 95% stenosis just beyond this. I performed one directional atherectomy followed by drug-eluting balloon angioplasty with excellent angiographic and  clinical result. His claudication has completely resolved as Dopplers performed on 11/11/15 have normalized. He was admitted to the hospital on 11/16/15 with a transient neurologic event with negative workup. Since I saw him in the office in October of last year he was remained stable. He denies chest pain, shortness of breath or claudication. He is on Repatha  with an excellent clinical result. His most recent lipid profile performed 03/17/16 revealed total cholesterol of 86 and LDL 32 and HDL 40. He had follow-up lower extremity arterial Doppler studies performed 08/31/16 revealing a new high frequency signal in his mid right SFA although he denies significant claudication.   Current Meds  Medication Sig  . aspirin EC 325 MG EC tablet Take 1 tablet (325 mg total) by mouth daily.  . clopidogrel (PLAVIX) 75 MG tablet TAKE 1 TABLET WITH BREAKFAST.  [name] Evolocumab (REPATHA [place]) Inject into the skin. TAKES EVERY OTHER WEEK.  . naproxen sodium (ANAPROX) 220 MG tablet Take 220 mg by mouth daily as needed.  . pravastatin (PRAVACHOL) 20 MG tablet Take 10 mg by mouth daily.  . tamsulosin (FLOMAX) 0.4 MG CAPS capsule Take 0.4 mg by mouth daily after supper.      No Known Allergies  Social History   Social History  . Marital status: Married    Spouse name: N/A  . Number of children: N/A  . Years of education: N/A   Occupational History  . Not on file.   Social History Main Topics  . Smoking status: Former Smoker    Packs/day: 0.20    Years: 40.00    Types: Cigarettes    Quit date: 11/29/2012  . Smokeless tobacco: Never Used  Comment: trying to quit, smokes 2-3 daily  . Alcohol use No  . Drug use: Yes    Types: Marijuana     Comment: "every once in a while"  . Sexual activity: Not on file   Other Topics Concern  . Not on file   Social History Narrative  . No narrative on file     Review of Systems: General: negative for chills, fever, night sweats or weight changes.  Cardiovascular:  negative for chest pain, dyspnea on exertion, edema, orthopnea, palpitations, paroxysmal nocturnal dyspnea or shortness of breath Dermatological: negative for rash Respiratory: negative for cough or wheezing Urologic: negative for hematuria Abdominal: negative for nausea, vomiting, diarrhea, bright red blood per rectum, melena, or hematemesis Neurologic: negative for visual changes, syncope, or dizziness All other systems reviewed and are otherwise negative except as noted above.    Blood pressure (!) 144/74, pulse 64, height 5\' 10"  (1.778 m), weight 170 lb 3.2 oz (77.2 kg), SpO2 98 %.  General appearance: alert and no distress Neck: no adenopathy, no JVD, supple, symmetrical, trachea midline, thyroid not enlarged, symmetric, no tenderness/mass/nodules and Soft bruits bilaterally Lungs: clear to auscultation bilaterally Heart: regular rate and rhythm, S1, S2 normal, no murmur, click, rub or gallop Extremities: extremities normal, atraumatic, no cyanosis or edema  EKG not performed today  ASSESSMENT AND PLAN:   Claudication (HCC) History of peripheral arterial disease status post right SFA stenting in the past by myself. He underwent angiography 11/09/15 revealing a patent stent with a high-grade stenosis just beyond this and [facility] followed by drug-eluting balloon angioplasty Angiographic and clinical result. His Dopplers performed 11/19/15 were entirely normal over his most recent Dopplers performed 08/31/16 revealed a new high frequency signal in his mid right SFA. He is only minimally symptomatic however. I will see him back in 3 months. Will follow-up lower extremity arterial Doppler studies in 6 months.  Carotid artery disease History of moderate right and mild to moderate left ICA stenosis by ultrasound 08/31/16 which we will repeat on an annual basis.  Hyperlipidemia History of hyperlipidemia on Repatha followed by his PCP.      Runell GessJonathan J. Benjamen Koelling  MD FACP,FACC,FAHA, [facility] 09/27/2016 3:18 PM

## 2016-09-27 NOTE — Assessment & Plan Note (Signed)
History of moderate right and mild to moderate left ICA stenosis by ultrasound 08/31/16 which we will repeat on an annual basis.

## 2016-09-27 NOTE — Patient Instructions (Signed)
Medication Instructions: Your physician recommends that you continue on your current medications as directed. Please refer to the Current Medication list given to you today.  Labwork: I will request recent blood work from Dr. Lamar BlinksGolding's office  Testing/Procedures: Your physician has requested that you have a carotid duplex in 08/2107. This test is an ultrasound of the carotid arteries in your neck. It looks at blood flow through these arteries that supply the brain with blood. Allow one hour for this exam. There are no restrictions or special instructions.  Your physician has requested that you have a lower extremity arterial duplex in 03/2017. During this test, ultrasound is used to evaluate arterial blood flow in the legs. Allow one hour for this exam. There are no restrictions or special instructions.  Your physician has requested that you have an ankle brachial index (ABI) in 03/2017. During this test an ultrasound and blood pressure cuff are used to evaluate the arteries that supply the arms and legs with blood. Allow thirty minutes for this exam. There are no restrictions or special instructions.  Follow-Up: Your physician recommends that you schedule a follow-up appointment in: 3 months with Dr. Allyson SabalBerry.  If you need a refill on your cardiac medications before your next appointment, please call your pharmacy.

## 2016-09-27 NOTE — Assessment & Plan Note (Signed)
History of peripheral arterial disease status post right SFA stenting in the past by myself. He underwent angiography 11/09/15 revealing a patent stent with a high-grade stenosis just beyond this and [facility] followed by drug-eluting balloon angioplasty Angiographic and clinical result. His Dopplers performed 11/19/15 were entirely normal over his most recent Dopplers performed 08/31/16 revealed a new high frequency signal in his mid right SFA. He is only minimally symptomatic however. I will see him back in 3 months. Will follow-up lower extremity arterial Doppler studies in 6 months.

## 2016-12-28 ENCOUNTER — Ambulatory Visit (INDEPENDENT_AMBULATORY_CARE_PROVIDER_SITE_OTHER): Payer: Self-pay | Admitting: Cardiovascular Disease

## 2016-12-28 ENCOUNTER — Encounter: Payer: Self-pay | Admitting: Cardiovascular Disease

## 2016-12-28 VITALS — BP 156/82 | HR 69 | Ht 70.0 in | Wt 170.0 lb

## 2016-12-28 DIAGNOSIS — I6523 Occlusion and stenosis of bilateral carotid arteries: Secondary | ICD-10-CM

## 2016-12-28 DIAGNOSIS — E7800 Pure hypercholesterolemia, unspecified: Secondary | ICD-10-CM

## 2016-12-28 DIAGNOSIS — I739 Peripheral vascular disease, unspecified: Secondary | ICD-10-CM

## 2016-12-28 NOTE — Assessment & Plan Note (Signed)
History of hyperlipidemia on Repatha  with recent lipid profile performed 03/17/16 revealing total cholesterol 86, LDL 32 and HDL of 40.

## 2016-12-28 NOTE — Progress Notes (Signed)
12/28/2016 George Cooper   [birth year]  [id number]  Primary Physician Assunta FoundGolding, John, MD Primary Cardiologist: Runell GessJonathan J Netra Postlethwait MD Nicholes CalamityFACP, FACC, FAHA, [place]  HPI:  George Cooper is a 61 y.o. male thin appearing married Caucasian male father of 2, grandfather of 2 grandchildren works as an Personnel officerelectrician. He was referred by Dr. Yetta NumbersBill McGough , at [facility], [place] for evaluation of dizziness and carotid artery disease. I last saw him in the office  09/27/16.He denies chest pain or shortness of breath. He did complain however of right calf claudication. His cardiovascular risk profile is positive for a 20-pack-year history of tobacco abuse discontinued 10/14. He is not diabetic, hypertensive or hyperlipidemic. His mother did have bypass surgery in her 2280s and his sister has had coronary stents as well.A stress test which was normal. Lower extremity arterial Dopplers revealed a high-frequency signal in the proximal segment of his right SFA. On 09/18/12 the patient underwent peripheral angiography, directional atherectomy, PTA and stenting of a high-grade segmental proximal right SFA stenosis. He had excellent angiographic and clinical result. His Doppler show a widely patent proximal right SFA and his claudication has resolved. His lower extremity arterial Doppler studies performed 05/14/13 revealed a widely patent stent with ABIs 1 bilaterally. He complains of mild right calf claudication with significant exertion. Since I saw him last he remained stable. He denies chest pain, shortness of breath . Lower extremity Dopplers performed 08/27/15 revealed a decline in his right ABI from one down to 0.91 with a new high-frequency signal in his distal right SFA. He underwent peripheral angiography by myself 11/09/15 feeling a patent mid right SFA stent with a 95% stenosis just beyond this. I performed one directional atherectomy followed by drug-eluting balloon angioplasty with excellent angiographic and  clinical result. His claudication has completely resolved as Dopplers performed on 11/11/15 have normalized. He was admitted to the hospital on 11/16/15 with a transient neurologic event with negative workup.Since I saw him in the office in October of last year he was remained stable. He denies chest pain, shortness of breath or claudication. He is on Repatha with an excellent clinical result. His most recent lipid profile performed 03/17/16 revealed total cholesterol of 86 and LDL 32 and HDL 40. He had follow-up lower extremity arterial Doppler studies performed 08/31/16 revealing a new high frequency signal in his mid right SFA although he denies significant claudication. Since I saw him 3 months ago he's remained stable denied chest pain, shortness of breath or claudication.    Current Meds  Medication Sig  . aspirin EC 325 MG EC tablet Take 1 tablet (325 mg total) by mouth daily.  . clopidogrel (PLAVIX) 75 MG tablet TAKE 1 TABLET WITH BREAKFAST.  [name]. Evolocumab (REPATHA [place]) Inject into the skin. TAKES EVERY OTHER WEEK.  . naproxen sodium (ANAPROX) 220 MG tablet Take 220 mg by mouth daily as needed.  . pravastatin (PRAVACHOL) 20 MG tablet Take 10 mg by mouth daily.  . tamsulosin (FLOMAX) 0.4 MG CAPS capsule Take 0.4 mg by mouth daily after supper.      No Known Allergies  Social History   Socioeconomic History  . Marital status: Married    Spouse name: Not on file  . Number of children: Not on file  . Years of education: Not on file  . Highest education level: Not on file  Social Needs  . Financial resource strain: Not on file  . Food insecurity - worry: Not on file  .  Food insecurity - inability: Not on file  . Transportation needs - medical: Not on file  . Transportation needs - non-medical: Not on file  Occupational History  . Not on file  Tobacco Use  . Smoking status: Former Smoker    Packs/day: 0.20    Years: 40.00    Pack years: 8.00    Types: Cigarettes    Last attempt  to quit: 11/29/2012    Years since quitting: 4.0  . Smokeless tobacco: Never Used  . Tobacco comment: trying to quit, smokes 2-3 daily  Substance and Sexual Activity  . Alcohol use: No    Alcohol/week: 0.0 oz  . Drug use: Yes    Types: Marijuana    Comment: "every once in a while"  . Sexual activity: Not on file  Other Topics Concern  . Not on file  Social History Narrative  . Not on file     Review of Systems: General: negative for chills, fever, night sweats or weight changes.  Cardiovascular: negative for chest pain, dyspnea on exertion, edema, orthopnea, palpitations, paroxysmal nocturnal dyspnea or shortness of breath Dermatological: negative for rash Respiratory: negative for cough or wheezing Urologic: negative for hematuria Abdominal: negative for nausea, vomiting, diarrhea, bright red blood per rectum, melena, or hematemesis Neurologic: negative for visual changes, syncope, or dizziness All other systems reviewed and are otherwise negative except as noted above.    Blood pressure (!) 156/82, pulse 69, height 5\' 10"  (1.778 m), weight 170 lb (77.1 kg).  General appearance: alert and no distress Neck: no adenopathy, no carotid bruit, no JVD, supple, symmetrical, trachea midline and thyroid not enlarged, symmetric, no tenderness/mass/nodules Lungs: clear to auscultation bilaterally Heart: regular rate and rhythm, S1, S2 normal, no murmur, click, rub or gallop Extremities: extremities normal, atraumatic, no cyanosis or edema Pulses: 2+ and symmetric Skin: Skin color, texture, turgor normal. No rashes or lesions Neurologic: Alert and oriented X 3, normal strength and tone. Normal symmetric reflexes. Normal coordination and gait  EKG not performed today  ASSESSMENT AND PLAN:   Carotid artery disease History of carotid artery disease with Dopplers performed 08/31/16 that showed moderate right and mild left ICA stenosis. We will repeat his Dopplers year from this Doppler  study in July of next year.  PAD (peripheral artery disease), s/p athrectomy, PTA/ stent Rt. SFA  09/18/2012 History of peripheral arterial disease status post right SFA stenting 09/18/12. He has had directional atherectomy and drug-eluting balloon angioplasty as recently as 11/09/15 with his most recent Dopplers performed 08/31/16 revealing normal ABIs bilaterally with a new high-frequency signal in his mid right SFA although he denies claudication. He scheduled for repeat Dopplers in February of next year.  Hyperlipidemia History of hyperlipidemia on Repatha  with recent lipid profile performed 03/17/16 revealing total cholesterol 86, LDL 32 and HDL of 40.      Runell GessJonathan J. Nakina Spatz MD FACP,FACC,FAHA, [facility] 12/28/2016 9:50 AM

## 2016-12-28 NOTE — Assessment & Plan Note (Signed)
History of carotid artery disease with Dopplers performed 08/31/16 that showed moderate right and mild left ICA stenosis. We will repeat his Dopplers year from this Doppler study in July of next year.

## 2016-12-28 NOTE — Patient Instructions (Signed)
Medication Instructions:   NO CHANGE  Labwork:  Your physician recommends that you return for lab work WHEN FASTING  Follow-Up:  Your physician wants you to follow-up in: ONE YEAR WITH DR San MorelleBERRY You will receive a reminder letter in the mail two months in advance. If you don't receive a letter, please call our office to schedule the follow-up appointment.   If you need a refill on your cardiac medications before your next appointment, please call your pharmacy.

## 2016-12-28 NOTE — Assessment & Plan Note (Signed)
History of peripheral arterial disease status post right SFA stenting 09/18/12. He has had directional atherectomy and drug-eluting balloon angioplasty as recently as 11/09/15 with his most recent Dopplers performed 08/31/16 revealing normal ABIs bilaterally with a new high-frequency signal in his mid right SFA although he denies claudication. He scheduled for repeat Dopplers in February of next year.

## 2017-02-17 ENCOUNTER — Telehealth: Payer: Self-pay | Admitting: Pharmacist

## 2017-02-17 NOTE — Telephone Encounter (Signed)
Needs repeat fastin lipids and AMGEN safetyNet renewal paperwork.  Patient to call back or stop by office to repeat blood work and complete patient assistance paperwork.

## 2017-02-21 LAB — HEPATIC FUNCTION PANEL
ALT: 17 IU/L (ref 0–44)
AST: 15 IU/L (ref 0–40)
Albumin: 4.5 g/dL (ref 3.6–4.8)
Alkaline Phosphatase: 73 IU/L (ref 39–117)
BILIRUBIN TOTAL: 0.3 mg/dL (ref 0.0–1.2)
BILIRUBIN, DIRECT: 0.1 mg/dL (ref 0.00–0.40)
Total Protein: 6.7 g/dL (ref 6.0–8.5)

## 2017-02-21 LAB — LIPID PANEL
CHOL/HDL RATIO: 1.9 ratio (ref 0.0–5.0)
CHOLESTEROL TOTAL: 82 mg/dL — AB (ref 100–199)
HDL: 43 mg/dL (ref >39)
LDL Calculated: 23 mg/dL (ref 0–99)
TRIGLYCERIDES: 80 mg/dL (ref 0–149)
VLDL Cholesterol Cal: 16 mg/dL (ref 5–40)

## 2017-04-03 ENCOUNTER — Ambulatory Visit (HOSPITAL_COMMUNITY): Payer: Self-pay

## 2017-04-06 ENCOUNTER — Ambulatory Visit (INDEPENDENT_AMBULATORY_CARE_PROVIDER_SITE_OTHER): Payer: Self-pay

## 2017-04-06 DIAGNOSIS — I739 Peripheral vascular disease, unspecified: Secondary | ICD-10-CM

## 2017-04-07 LAB — VAS US LOWER EXTREMITY ARTERIAL DUPLEX
RIGHT POPLITEAL DIST EDV: 62 cm/s
RIGHT POPLITEAL PROX EDV: -74 cm/s
RPOPDPSV: 75 cm/s
RPOPPPSV: -88 cm/s
RSFDPSV: -81 cm/s
RTSFADISTDIA: -68 cm/s
RTSFAMIDDIA: -101 cm/s
RTSFAPROXDIA: 8 cm/s
Right super femoral mid sys PSV: -165 cm/s
Right super femoral prox sys PSV: 162 cm/s

## 2017-04-17 ENCOUNTER — Other Ambulatory Visit: Payer: Self-pay | Admitting: Cardiovascular Disease

## 2017-04-17 DIAGNOSIS — I739 Peripheral vascular disease, unspecified: Secondary | ICD-10-CM

## 2017-08-11 IMAGING — DX DG FEMUR 2+V*L*
5 series · 5 of 5 positions shown · non-contrast
Comparison: No recent prior .

CLINICAL DATA: Pain.

EXAM:
LEFT FEMUR 2 VIEWS

[femur ap (1 of 2)]
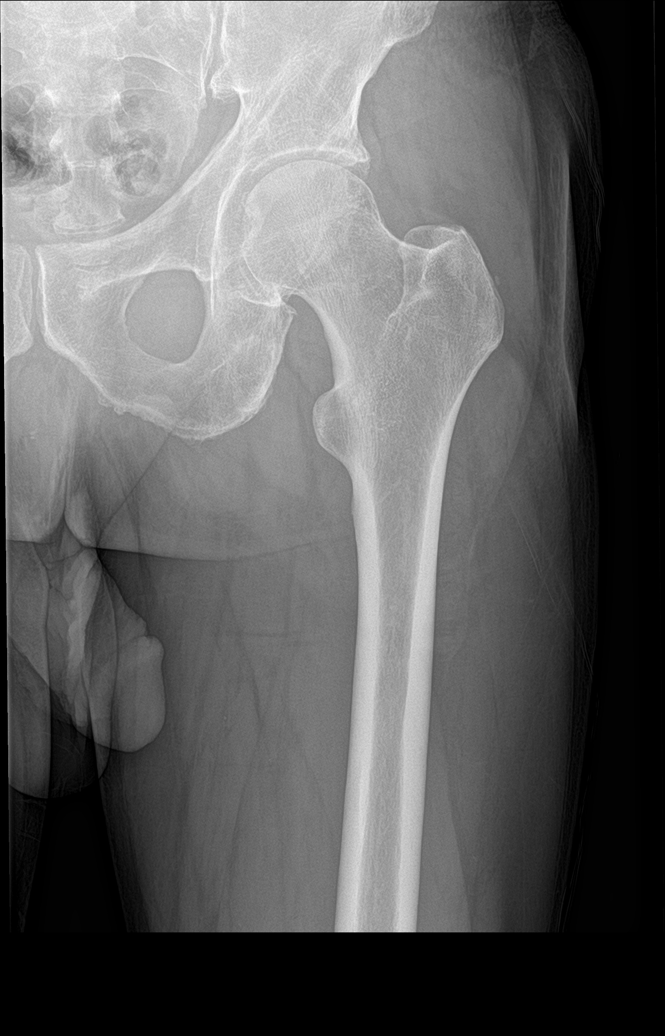

[femur ap (2 of 2)]
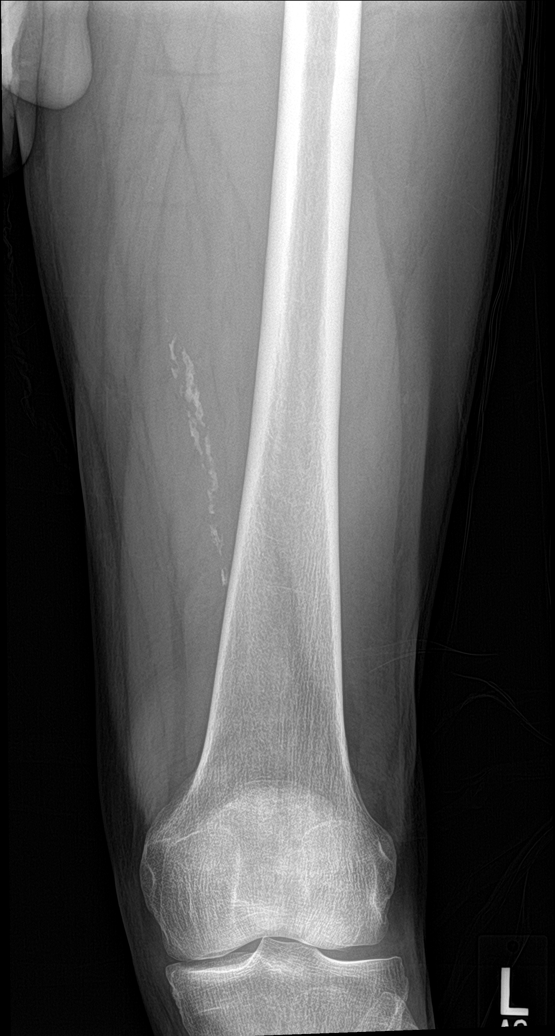

[femur lat (1 of 3)]
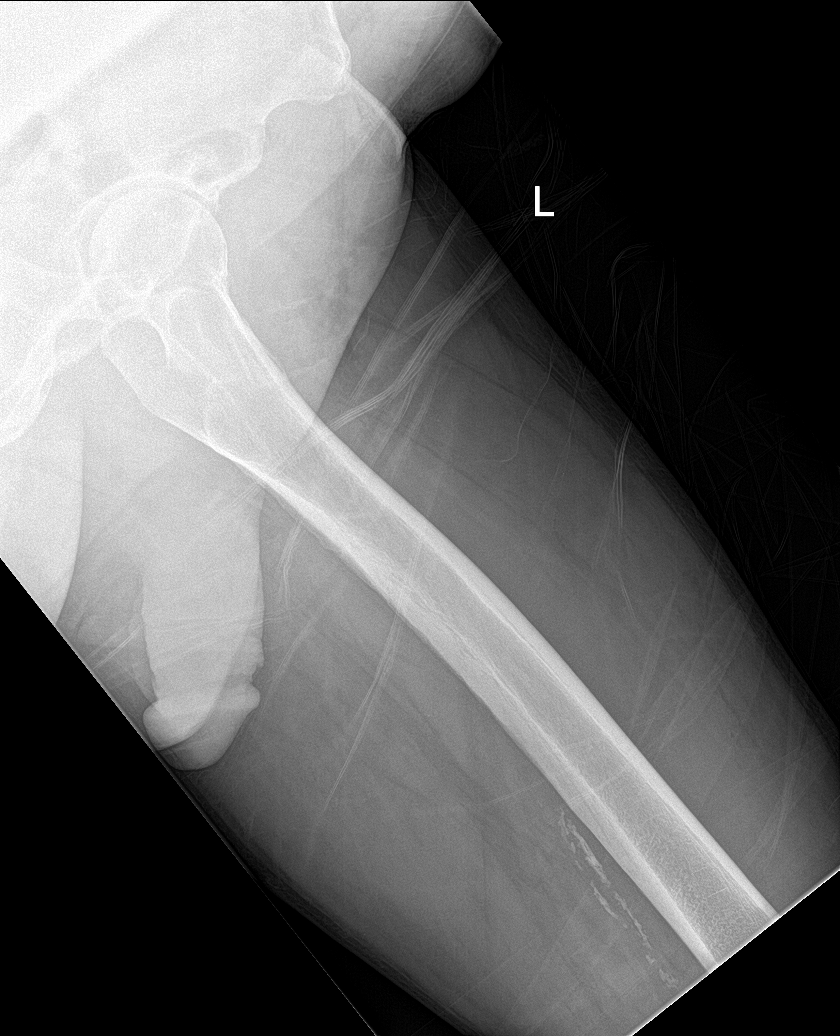

[femur lat (2 of 3)]
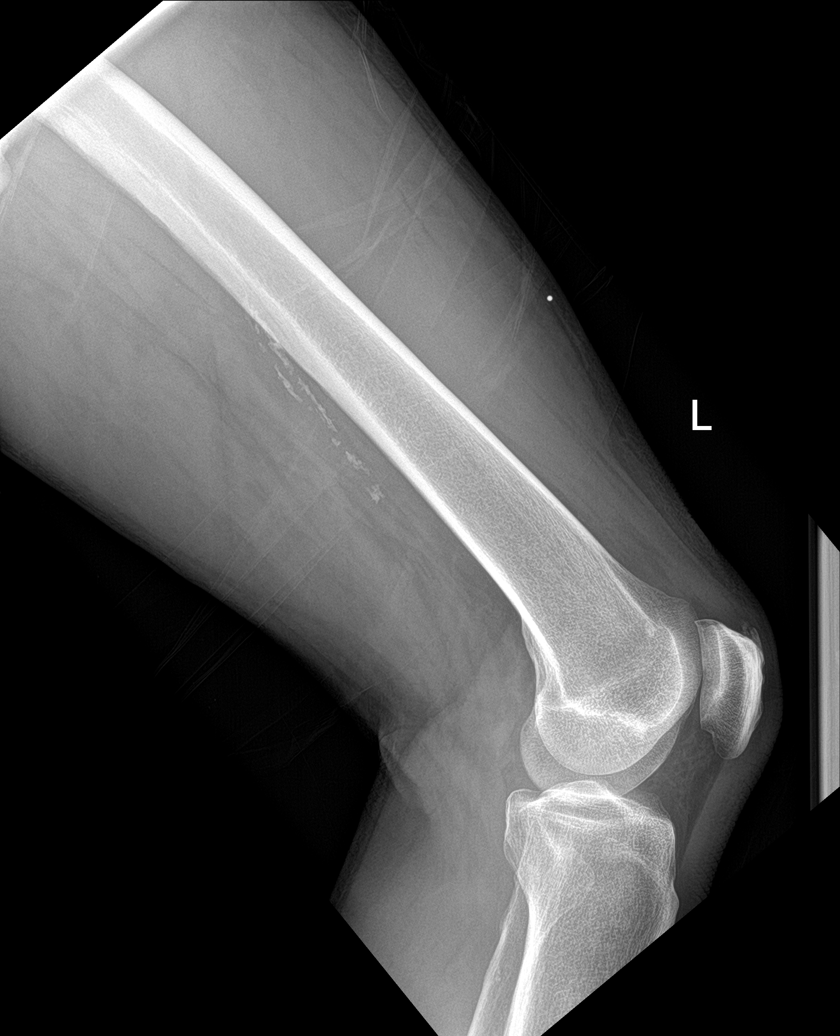

[femur lat (3 of 3)]
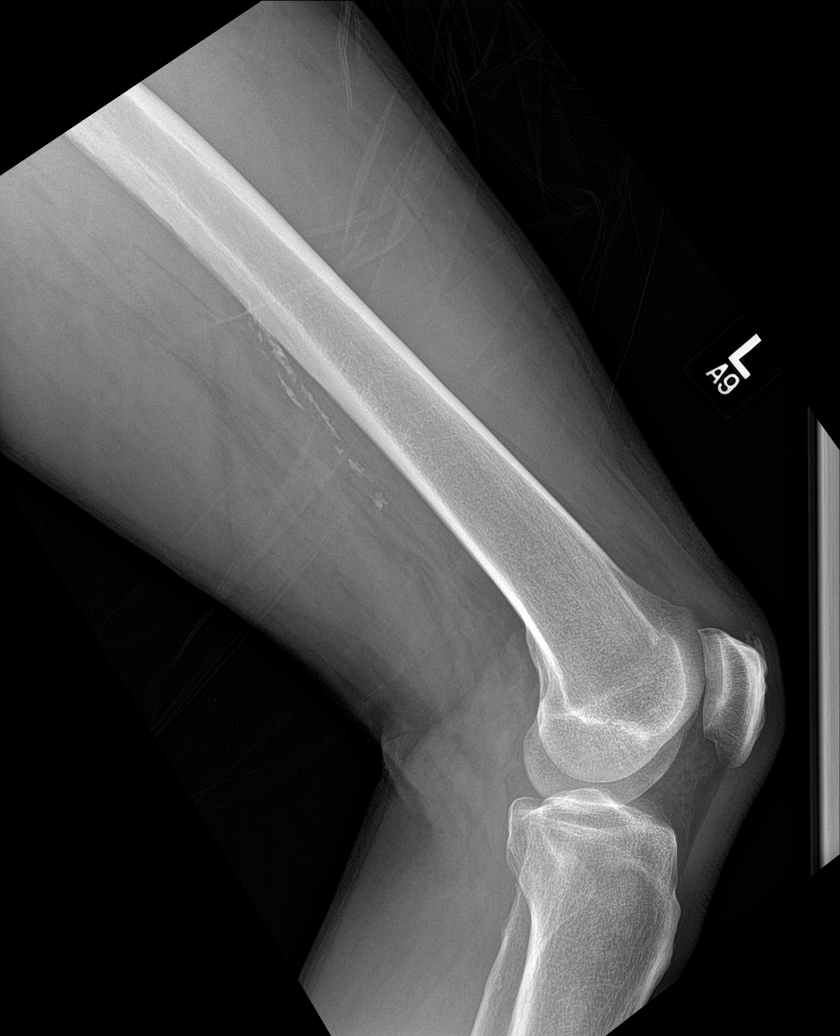

[5 of 5 positions shown; findings below may reference images not displayed]

FINDINGS: No acute bony or joint abnormality identified. No evidence of
fracture or dislocation. Peripheral vascular calcification .
IMPRESSION: 1.  No acute or focal bony abnormality.

2.  Peripheral vascular disease .

## 2017-09-05 ENCOUNTER — Inpatient Hospital Stay (HOSPITAL_COMMUNITY): Admission: RE | Admit: 2017-09-05 | Payer: Self-pay | Source: Ambulatory Visit

## 2017-09-21 ENCOUNTER — Ambulatory Visit (INDEPENDENT_AMBULATORY_CARE_PROVIDER_SITE_OTHER): Payer: Self-pay

## 2017-09-21 DIAGNOSIS — I779 Disorder of arteries and arterioles, unspecified: Secondary | ICD-10-CM

## 2017-09-21 DIAGNOSIS — I739 Peripheral vascular disease, unspecified: Principal | ICD-10-CM

## 2017-09-25 ENCOUNTER — Other Ambulatory Visit: Payer: Self-pay | Admitting: *Deleted

## 2017-09-25 DIAGNOSIS — I6523 Occlusion and stenosis of bilateral carotid arteries: Secondary | ICD-10-CM

## 2018-01-09 ENCOUNTER — Ambulatory Visit: Payer: Self-pay | Admitting: Cardiovascular Disease

## 2018-01-09 ENCOUNTER — Encounter: Payer: Self-pay | Admitting: Cardiovascular Disease

## 2018-01-09 DIAGNOSIS — I739 Peripheral vascular disease, unspecified: Secondary | ICD-10-CM

## 2018-01-09 DIAGNOSIS — E7800 Pure hypercholesterolemia, unspecified: Secondary | ICD-10-CM

## 2018-01-09 MED ORDER — ASPIRIN EC 81 MG PO TBEC: 81.0000 mg | DELAYED_RELEASE_TABLET | Freq: Every day | ORAL | 3 refills | Status: AC

## 2018-01-09 NOTE — Assessment & Plan Note (Signed)
History of hyperlipidemia on pravastatin and Repatha with lipid profile performed 02/20/2017 revealing total cholesterol 82, LDL 23 and HDL of 43.  We will recheck a lipid and liver profile.

## 2018-01-09 NOTE — Patient Instructions (Addendum)
Medication Instructions:  Your physician has recommended you make the following change in your medication:  1) DECREASE Asprin to 81 mg tablet ONCE daily  If you need a refill on your cardiac medications before your next appointment, please call your pharmacy.   Lab work: Your physician recommends that you return for lab work in: FASTING - LIPID/LIVER  If you have labs (blood work) drawn today and your tests are completely normal, you will receive your results only by: [name]. MyChart Message (if you have MyChart) OR . A paper copy in the mail If you have any lab test that is abnormal or we need to change your treatment, we will call you to review the results.  Testing/Procedures: Your physician has requested that you have a lower extremity arterial doppler- During this test, ultrasound is used to evaluate arterial blood flow in the legs. Allow approximately one hour for this exam.  Your physician has requested that you have an ankle brachial index (ABI). During this test an ultrasound and blood pressure cuff are used to evaluate the arteries that supply the arms and legs with blood. Allow thirty minutes for this exam. There are no restrictions or special instructions.  SCHEDULE IN FEB. 2020  Follow-Up: At [facility], you and your health needs are our priority.  As part of our continuing mission to provide you with exceptional heart care, we have created designated Provider Care Teams.  These Care Teams include your primary Cardiologist (physician) and Advanced Practice Providers (APPs -  Physician Assistants and Nurse Practitioners) who all work together to provide you with the care you need, when you need it. You will need a follow up appointment in 12 months.  Please call our office 2 months in advance to schedule this appointment.  You may see Nanetta BattyJonathan Berry, MD or one of the following Advanced Practice Providers on your designated Care Team:   Corine ShelterLuke Kilroy, PA-C Judy PimpleKrista Kroeger,  [place]PA-C . Marjie Skiffallie Goodrich, PA-C  Any Other Special Instructions Will Be Listed Below (If Applicable).

## 2018-01-09 NOTE — Assessment & Plan Note (Signed)
History of mild bilateral ICA stenosis.

## 2018-01-09 NOTE — Progress Notes (Signed)
01/09/2018 George Cooper   1955-10-13  [id number]  Primary Physician Assunta Found, MD Primary Cardiologist: Runell Gess MD Nicholes Calamity, [place]  HPI:  George Cooper is a 62 y.o.  thin appearing married Caucasian male father of 2, grandfather of 2 grandchildren works as an Personnel officer. He was referred by Dr. Yetta Numbers , at [facility], in [place] for evaluation of dizziness and carotid artery disease. I last saw him in the office  12/28/2016.George KitchenHe denies chest pain or shortness of breath. He did complain however of right calf claudication. His cardiovascular risk profile is positive for a 20-pack-year history of tobacco abuse discontinued 10/14. He is not diabetic, hypertensive or hyperlipidemic. His mother did have bypass surgery in her 70s and his sister has had coronary stents as well.A stress test which was normal. Lower extremity arterial Dopplers revealed a high-frequency signal in the proximal segment of his right SFA. On 09/18/12 the patient underwent peripheral angiography, directional atherectomy, PTA and stenting of a high-grade segmental proximal right SFA stenosis. He had excellent angiographic and clinical result. His Doppler show a widely patent proximal right SFA and his claudication has resolved. His lower extremity arterial Doppler studies performed 05/14/13 revealed a widely patent stent with ABIs 1 bilaterally. He complains of mild right calf claudication with significant exertion. Since I saw him last he remained stable. He denies chest pain, shortness of breath . Lower extremity Dopplers performed 08/27/15 revealed a decline in his right ABI from one down to 0.91 with a new high-frequency signal in his distal right SFA. He underwent peripheral angiography by myself 11/09/15 feeling a patent mid right SFA stent with a 95% stenosis just beyond this. I performed one directional atherectomy followed by drug-eluting balloon angioplasty with excellent angiographic and  clinical result. His claudication has completely resolved as Dopplers performed on 11/11/15 have normalized. He was admitted to the hospital on 11/16/15 with a transient neurologic event with negative workup.Since I saw him in the office in October of last year he was remained stable. He denies chest pain, shortness of breath or claudication. He is on Repatha with an excellent clinical result. His most recent lipid profile performed 02/20/2017 revealed total cholesterol of 82, LDL 23 and HDL 43. He had follow-up lower extremity arterial Doppler studies performed 08/31/16 revealing a new high frequency signal in his mid right SFA although he denies significant claudication.  Since I saw him a year ago he is remained stable.  He denies chest pain, shortness of breath or claudication.  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  He does woodworking at home which he enjoys.    Current Meds  Medication Sig  . aspirin EC 325 MG EC tablet Take 1 tablet (325 mg total) by mouth daily.  . clopidogrel (PLAVIX) 75 MG tablet TAKE 1 TABLET WITH BREAKFAST.  George Cooper Evolocumab (REPATHA [place]) Inject 75 Units into the skin as directed. TAKES EVERY OTHER WEEK.   . naproxen sodium (ANAPROX) 220 MG tablet Take 220 mg by mouth daily as needed.  . pravastatin (PRAVACHOL) 20 MG tablet Take 10 mg by mouth daily.     No Known Allergies  Social History   Socioeconomic History  . Marital status: Married    Spouse name: Not on file  . Number of children: Not on file  . Years of education: Not on file  . Highest education level: Not on file  Occupational History  .  Not on file  Social Needs  . Financial resource strain: Not on file  . Food insecurity:    Worry: Not on file    Inability: Not on file  . Transportation needs:    Medical: Not on file    Non-medical: Not on file  Tobacco Use  . Smoking status: Former Smoker    Packs/day: 0.20    Years: 40.00    Pack years: 8.00     Types: Cigarettes    Last attempt to quit: 11/29/2012    Years since quitting: 5.1  . Smokeless tobacco: Never Used  . Tobacco comment: trying to quit, smokes 2-3 daily  Substance and Sexual Activity  . Alcohol use: No    Alcohol/week: 0.0 standard drinks  . Drug use: Yes    Types: Marijuana    Comment: "every once in a while"  . Sexual activity: Not on file  Lifestyle  . Physical activity:    Days per week: Not on file    Minutes per session: Not on file  . Stress: Not on file  Relationships  . Social connections:    Talks on phone: Not on file    Gets together: Not on file    Attends religious service: Not on file    Active member of club or organization: Not on file    Attends meetings of clubs or organizations: Not on file    Relationship status: Not on file  . Intimate partner violence:    Fear of current or ex partner: Not on file    Emotionally abused: Not on file    Physically abused: Not on file    Forced sexual activity: Not on file  Other Topics Concern  . Not on file  Social History Narrative  . Not on file     Review of Systems: General: negative for chills, fever, night sweats or weight changes.  Cardiovascular: negative for chest pain, dyspnea on exertion, edema, orthopnea, palpitations, paroxysmal nocturnal dyspnea or shortness of breath Dermatological: negative for rash Respiratory: negative for cough or wheezing Urologic: negative for hematuria Abdominal: negative for nausea, vomiting, diarrhea, bright red blood per rectum, melena, or hematemesis Neurologic: negative for visual changes, syncope, or dizziness All other systems reviewed and are otherwise negative except as noted above.    Blood pressure (!) 158/80, pulse 72, height 5\' 9"  (1.753 m), weight 164 lb 6.4 oz (74.6 kg).  General appearance: alert and no distress Neck: no adenopathy, no JVD, supple, symmetrical, trachea midline, thyroid not enlarged, symmetric, no tenderness/mass/nodules and  Soft bilateral carotid bruits left louder than right Lungs: clear to auscultation bilaterally Heart: regular rate and rhythm, S1, S2 normal, no murmur, click, rub or gallop Extremities: extremities normal, atraumatic, no cyanosis or edema Pulses: 2+ and symmetric Skin: Skin color, texture, turgor normal. No rashes or lesions Neurologic: Alert and oriented X 3, normal strength and tone. Normal symmetric reflexes. Normal coordination and gait  EKG sinus rhythm at 72 with complete right bundle branch block and small inferior Q waves extending to the lateral leads.  I personally reviewed this EKG.  ASSESSMENT AND PLAN:   Claudication [facility]) History of PAD status post right SFA stenting back in 2014 with re-intervention by myself 11/09/2015.  His right SFA stent was patent.  He did have a 95% stenosis just distal to this and underwent directional atherectomy and drug-coated balloon angioplasty.  He had 60% left common iliac artery stenosis and three-vessel runoff bilaterally.  He really denies claudication  currently.  Carotid artery disease History of mild bilateral ICA stenosis.  Hyperlipidemia History of hyperlipidemia on pravastatin and Repatha with lipid profile performed 02/20/2017 revealing total cholesterol 82, LDL 23 and HDL of 43.  We will recheck a lipid and liver profile.      Runell GessJonathan J. Asya Derryberry MD FACP,FACC,FAHA, [facility] 01/09/2018 9:01 AM

## 2018-01-09 NOTE — Assessment & Plan Note (Signed)
History of PAD status post right SFA stenting back in 2014 with re-intervention by myself 11/09/2015.  His right SFA stent was patent.  He did have a 95% stenosis just distal to this and underwent directional atherectomy and drug-coated balloon angioplasty.  He had 60% left common iliac artery stenosis and three-vessel runoff bilaterally.  He really denies claudication currently.

## 2018-01-24 ENCOUNTER — Encounter: Payer: Self-pay | Admitting: *Deleted

## 2018-01-24 LAB — HEPATIC FUNCTION PANEL
ALK PHOS: 79 IU/L (ref 39–117)
ALT: 16 IU/L (ref 0–44)
AST: 18 IU/L (ref 0–40)
Albumin: 4.2 g/dL (ref 3.6–4.8)
Bilirubin Total: 0.3 mg/dL (ref 0.0–1.2)
Bilirubin, Direct: 0.1 mg/dL (ref 0.00–0.40)
TOTAL PROTEIN: 6.4 g/dL (ref 6.0–8.5)

## 2018-01-24 LAB — LIPID PANEL
CHOLESTEROL TOTAL: 91 mg/dL — AB (ref 100–199)
Chol/HDL Ratio: 2.2 ratio (ref 0.0–5.0)
HDL: 42 mg/dL (ref >39)
LDL CALC: 33 mg/dL (ref 0–99)
TRIGLYCERIDES: 82 mg/dL (ref 0–149)
VLDL Cholesterol Cal: 16 mg/dL (ref 5–40)

## 2018-04-11 ENCOUNTER — Ambulatory Visit (HOSPITAL_COMMUNITY)
Admission: RE | Admit: 2018-04-11 | Discharge: 2018-04-11 | Disposition: A | Discharge status: Home / Self Care | Payer: Self-pay | Source: Ambulatory Visit | Attending: Internal Medicine | Admitting: Internal Medicine

## 2018-04-11 DIAGNOSIS — I739 Peripheral vascular disease, unspecified: Secondary | ICD-10-CM

## 2018-04-13 ENCOUNTER — Telehealth: Payer: Self-pay

## 2018-04-13 DIAGNOSIS — I739 Peripheral vascular disease, unspecified: Secondary | ICD-10-CM

## 2018-04-13 NOTE — Telephone Encounter (Signed)
Left voicemail of recent LEA doppler results per patient DPR. Repeat order placed.

## 2018-08-15 ENCOUNTER — Other Ambulatory Visit: Payer: Self-pay | Admitting: Cardiovascular Disease

## 2018-08-15 MED ORDER — CLOPIDOGREL BISULFATE 75 MG PO TABS: ORAL_TABLET | ORAL | 1 refills | Status: DC

## 2018-08-15 NOTE — Telephone Encounter (Signed)
°*  STAT* If patient is at the pharmacy, call can be transferred to refill team.   1. Which medications need to be refilled? (please list name of each medication and dose if known)   clopidogrel (PLAVIX) 75 MG tablet     2. Which pharmacy/location (including street and city if local pharmacy) is medication to be sent to?  [place]  3. Do they need a 30 day or 90 day supply?  90 day  Patient is out of medication

## 2018-09-24 ENCOUNTER — Other Ambulatory Visit (HOSPITAL_COMMUNITY): Payer: Self-pay | Admitting: Cardiovascular Disease

## 2018-09-24 ENCOUNTER — Other Ambulatory Visit: Payer: Self-pay

## 2018-09-24 ENCOUNTER — Ambulatory Visit (HOSPITAL_COMMUNITY)
Admission: RE | Admit: 2018-09-24 | Discharge: 2018-09-24 | Disposition: A | Discharge status: Home / Self Care | Payer: Self-pay | Source: Ambulatory Visit | Attending: Cardiology | Admitting: Cardiology

## 2018-09-24 DIAGNOSIS — I6523 Occlusion and stenosis of bilateral carotid arteries: Secondary | ICD-10-CM

## 2018-09-25 ENCOUNTER — Other Ambulatory Visit: Payer: Self-pay | Admitting: *Deleted

## 2018-09-25 DIAGNOSIS — I6523 Occlusion and stenosis of bilateral carotid arteries: Secondary | ICD-10-CM

## 2018-09-28 ENCOUNTER — Other Ambulatory Visit: Payer: Self-pay | Admitting: *Deleted

## 2018-09-28 DIAGNOSIS — I6523 Occlusion and stenosis of bilateral carotid arteries: Secondary | ICD-10-CM

## 2018-11-20 ENCOUNTER — Telehealth: Payer: Self-pay

## 2018-11-20 NOTE — Telephone Encounter (Signed)
Called to see if the pt was even still taking the repatha because the pharmacy stated that they don't have rx for it there and also that the pt has been paying out of pocket for all meds

## 2018-11-23 ENCOUNTER — Telehealth: Payer: Self-pay | Admitting: Pharmacist

## 2018-11-23 DIAGNOSIS — I739 Peripheral vascular disease, unspecified: Secondary | ICD-10-CM

## 2018-11-23 NOTE — Telephone Encounter (Signed)
Patient received paperwork to renew AMGEN safetynet. Will complete form and repeat lipid panel by Novemeber.   Order in epic

## 2018-11-30 LAB — LIPID PANEL
Chol/HDL Ratio: 1.8 ratio (ref 0.0–5.0)
Cholesterol, Total: 84 mg/dL — ABNORMAL LOW (ref 100–199)
HDL: 48 mg/dL (ref >39)
LDL Chol Calc (NIH): 22 mg/dL (ref 0–99)
Triglycerides: 63 mg/dL (ref 0–149)
VLDL Cholesterol Cal: 14 mg/dL (ref 5–40)

## 2018-12-17 ENCOUNTER — Telehealth: Payer: Self-pay

## 2018-12-17 NOTE — Telephone Encounter (Signed)
Called and lmomed the pt regarding the denial of the snf and that snf stated that they have insurance coverage w/aetna so we need a callback regarding that information

## 2018-12-31 ENCOUNTER — Telehealth: Payer: Self-pay

## 2018-12-31 MED ORDER — PRALUENT 150 MG/ML ~~LOC~~ SOAJ: 150.0000 mg | SUBCUTANEOUS | 11 refills | Status: DC

## 2018-12-31 NOTE — Telephone Encounter (Signed)
Called and let the pt know that the praluent 150 is available w/out pa and rx sent. Instructed the pt to call me back and let me know the cost if unaffordable since the pt was on snf for repatha last year. I also instructed the pt to apply through the [employer] by phone

## 2019-01-23 ENCOUNTER — Other Ambulatory Visit: Payer: Self-pay

## 2019-01-23 ENCOUNTER — Encounter: Payer: Self-pay | Admitting: Cardiovascular Disease

## 2019-01-23 ENCOUNTER — Ambulatory Visit (INDEPENDENT_AMBULATORY_CARE_PROVIDER_SITE_OTHER): Payer: Medicare HMO | Admitting: Cardiovascular Disease

## 2019-01-23 VITALS — BP 142/72 | HR 74 | Temp 97.0°F | Ht 70.0 in | Wt 167.0 lb

## 2019-01-23 DIAGNOSIS — I6523 Occlusion and stenosis of bilateral carotid arteries: Secondary | ICD-10-CM | POA: Diagnosis not present

## 2019-01-23 DIAGNOSIS — I739 Peripheral vascular disease, unspecified: Secondary | ICD-10-CM

## 2019-01-23 NOTE — Progress Notes (Signed)
01/23/2019 George Cooper   05-06-1955  [id number]  Primary Physician Assunta Found, MD Primary Cardiologist: Runell Gess MD Nicholes Calamity, [place]  HPI:  George Cooper is a 63 y.o.  thin appearing married Caucasian male father of 2, grandfather of 2 grandchildren works as an Personnel officer. He was referred by Dr. Yetta Cooper , at [facility], in [place] for evaluation of dizziness and carotid artery disease. I last saw him in the office 01/09/2018.George KitchenHe denies chest pain or shortness of breath. He did complain however of right calf claudication. His cardiovascular risk profile is positive for a 20-pack-year history of tobacco abuse discontinued 10/14. He is not diabetic, hypertensive or hyperlipidemic. His mother did have bypass surgery in her 22s and his sister has had coronary stents as well.A stress test which was normal. Lower extremity arterial Dopplers revealed a high-frequency signal in the proximal segment of his right SFA. On 09/18/12 the patient underwent peripheral angiography, directional atherectomy, PTA and stenting of a high-grade segmental proximal right SFA stenosis. He had excellent angiographic and clinical result. His Doppler show a widely patent proximal right SFA and his claudication has resolved. His lower extremity arterial Doppler studies performed 05/14/13 revealed a widely patent stent with ABIs 1 bilaterally. He complains of mild right calf claudication with significant exertion. Since I saw him last he remained stable. He denies chest pain, shortness of breath . Lower extremity Dopplers performed 08/27/15 revealed a decline in his right ABI from one down to 0.91 with a new high-frequency signal in his distal right SFA. He underwent peripheral angiography by myself 11/09/15 feeling a patent mid right SFA stent with a 95% stenosis just beyond this. I performed one directional atherectomy followed by drug-eluting balloon angioplasty with excellent angiographic and  clinical result. His claudication has completely resolved as Dopplers performed on 11/11/15 have normalized. He was admitted to the hospital on 11/16/15 with a transient neurologic event with negative workup.Since I saw him in the office in October of last year he was remained stable. He denies chest pain, shortness of breath or claudication. He is on Repatha with an excellent clinical result. His most recent lipid profile performed 02/20/2017 revealed total cholesterol of 82, LDL 23 and HDL 43. He had follow-up lower extremity arterial Doppler studies performed 08/31/16 revealing a new high frequency signal in his mid right SFA although he denies significant claudication.  Since I saw him a year ago he is remained stable.  He denies chest pain, shortness of breath or claudication.  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  He was doing woodworking at home and actually may be a ball last him I saw him but has since transitioned to playing a guitar and learning on his own by watching YouTube videos.    Current Meds  Medication Sig   Alirocumab (PRALUENT) 150 MG/ML SOAJ Inject 150 mg into the skin every 14 (fourteen) days.   aspirin EC 81 MG tablet Take 1 tablet (81 mg total) by mouth daily.   clopidogrel (PLAVIX) 75 MG tablet TAKE 1 TABLET WITH BREAKFAST.   naproxen sodium (ANAPROX) 220 MG tablet Take 220 mg by mouth daily as needed.   pravastatin (PRAVACHOL) 20 MG tablet Take 10 mg by mouth daily.     No Known Allergies  Social History   Socioeconomic History   Marital status: Married    Spouse name: Not on file   Number of children: Not  on file   Years of education: Not on file   Highest education level: Not on file  Occupational History   Not on file  Social Needs   Financial resource strain: Not on file   Food insecurity    Worry: Not on file    Inability: Not on file   Transportation needs    Medical: Not on file     Non-medical: Not on file  Tobacco Use   Smoking status: Former Smoker    Packs/day: 0.20    Years: 40.00    Pack years: 8.00    Types: Cigarettes    Quit date: 11/29/2012    Years since quitting: 6.1   Smokeless tobacco: Never Used   Tobacco comment: trying to quit, smokes 2-3 daily  Substance and Sexual Activity   Alcohol use: No    Alcohol/week: 0.0 standard drinks   Drug use: Yes    Types: Marijuana    Comment: "every once in a while"   Sexual activity: Not on file  Lifestyle   Physical activity    Days per week: Not on file    Minutes per session: Not on file   Stress: Not on file  Relationships   Social connections    Talks on phone: Not on file    Gets together: Not on file    Attends religious service: Not on file    Active member of club or organization: Not on file    Attends meetings of clubs or organizations: Not on file    Relationship status: Not on file   Intimate partner violence    Fear of current or ex partner: Not on file    Emotionally abused: Not on file    Physically abused: Not on file    Forced sexual activity: Not on file  Other Topics Concern   Not on file  Social History Narrative   Not on file     Review of Systems: General: negative for chills, fever, night sweats or weight changes.  Cardiovascular: negative for chest pain, dyspnea on exertion, edema, orthopnea, palpitations, paroxysmal nocturnal dyspnea or shortness of breath Dermatological: negative for rash Respiratory: negative for cough or wheezing Urologic: negative for hematuria Abdominal: negative for nausea, vomiting, diarrhea, bright red blood per rectum, melena, or hematemesis Neurologic: negative for visual changes, syncope, or dizziness All other systems reviewed and are otherwise negative except as noted above.    Blood pressure (!) 142/72, pulse 74, temperature (!) 97 F (36.1 C), height 5\' 10"  (1.778 m), weight 167 lb (75.8 kg), SpO2 98 %.  General  appearance: alert and no distress Neck: no adenopathy, no JVD, supple, symmetrical, trachea midline, thyroid not enlarged, symmetric, no tenderness/mass/nodules and Soft left carotid bruit Lungs: clear to auscultation bilaterally Heart: regular rate and rhythm, S1, S2 normal, no murmur, click, rub or gallop Extremities: extremities normal, atraumatic, no cyanosis or edema Pulses: 2+ and symmetric Skin: Skin color, texture, turgor normal. No rashes or lesions Neurologic: Alert and oriented X 3, normal strength and tone. Normal symmetric reflexes. Normal coordination and gait  EKG sinus rhythm at 61 with inferior and lateral Q waves unchanged from prior EKGs.  I personally reviewed this EKG.  ASSESSMENT AND PLAN:   Carotid artery disease History of only mild bilateral ICA stenosis by duplex ultrasound performed 09/24/2018.  PAD (peripheral artery disease), s/p athrectomy, PTA/ stent Rt. SFA  09/18/2012 History of peripheral arterial disease status post right SFA intervention by myself 09/18/2012 with 3 intervention  11/09/2015 of a focal lesion just beyond the stented segment which resulted in a small linear dissection but a widely patent.  His most recent lower extremity arterial Doppler studies performed 04/11/2018 revealed a patent stent with a right ABI of 0.93 and a left of 1.14.  He denies claudication.  We will repeat lower extremity arterial Doppler studies in February  Hyperlipidemia History of hyperlipidemia on Praluent with lipid profile performed 11/29/2018 revealing total cholesterol 84, LDL 22 and HDL of 48.      Runell GessJonathan J. Shalondra Wunschel MD FACP,FACC,FAHA, [facility] 01/23/2019 9:40 AM

## 2019-01-23 NOTE — Assessment & Plan Note (Signed)
History of only mild bilateral ICA stenosis by duplex ultrasound performed 09/24/2018.

## 2019-01-23 NOTE — Assessment & Plan Note (Signed)
History of hyperlipidemia on Praluent with lipid profile performed 11/29/2018 revealing total cholesterol 84, LDL 22 and HDL of 48.

## 2019-01-23 NOTE — Patient Instructions (Signed)
Medication Instructions:  Your physician recommends that you continue on your current medications as directed. Please refer to the Current Medication list given to you today.  If you need a refill on your cardiac medications before your next appointment, please call your pharmacy.   Lab work: NONE  Testing/Procedures: In February  Your physician has requested that you have a lower extremity arterial exercise duplex. During this test, exercise and ultrasound are used to evaluate arterial blood flow in the legs. Allow one hour for this exam. There are no restrictions or special instructions.  AND  Your physician has requested that you have an ankle brachial index (ABI). During this test an ultrasound and blood pressure cuff are used to evaluate the arteries that supply the arms and legs with blood. Allow thirty minutes for this exam. There are no restrictions or special instructions.  Follow-Up: At [facility], you and your health needs are our priority.  As part of our continuing mission to provide you with exceptional heart care, we have created designated Provider Care Teams.  These Care Teams include your primary Cardiologist (physician) and Advanced Practice Providers (APPs -  Physician Assistants and Nurse Practitioners) who all work together to provide you with the care you need, when you need it. You may see Quay Burow, MD or one of the following Advanced Practice Providers on your designated Care Team:    Kerin Ransom, PA-C  [place], [place]  Coletta Memos, [place] Your physician wants you to follow-up in: 1 year.

## 2019-01-23 NOTE — Assessment & Plan Note (Signed)
History of peripheral arterial disease status post right SFA intervention by myself 09/18/2012 with 3 intervention 11/09/2015 of a focal lesion just beyond the stented segment which resulted in a small linear dissection but a widely patent.  His most recent lower extremity arterial Doppler studies performed 04/11/2018 revealed a patent stent with a right ABI of 0.93 and a left of 1.14.  He denies claudication.  We will repeat lower extremity arterial Doppler studies in February

## 2019-02-12 ENCOUNTER — Other Ambulatory Visit: Payer: Self-pay | Admitting: Cardiovascular Disease

## 2019-02-12 ENCOUNTER — Telehealth: Payer: Self-pay | Admitting: Cardiovascular Disease

## 2019-02-12 MED ORDER — AMLODIPINE BESYLATE 5 MG PO TABS: 5.0000 mg | ORAL_TABLET | Freq: Every day | ORAL | 3 refills | Status: DC

## 2019-02-12 NOTE — Telephone Encounter (Signed)
New message   Pt c/o BP issue: STAT if pt c/o blurred vision, one-sided weakness or slurred speech  1. What are your last 5 BP readings?163/75 177/85  173/88 184/83 174/83  2. Are you having any other symptoms (ex. Dizziness, headache, blurred vision, passed out)? No   3. What is your BP issue? B/p is elevated per patient.

## 2019-02-12 NOTE — Telephone Encounter (Signed)
Spoke to patient Dr.Christopher's advice given.Advised I will send message to [facility] for advice.

## 2019-02-12 NOTE — Telephone Encounter (Signed)
If asymptomatic, no indication for urgent treatment. Monitor for chest pain and neuro changes, which are reasons to go to ER. Would route to Dr. Gwenlyn Found for further long term management recommendations.

## 2019-02-12 NOTE — Telephone Encounter (Signed)
Have him start Amlodipine 5 mg/d. Keep daily BP log. Appt with George Cooper in 2 weeks to review

## 2019-02-12 NOTE — Telephone Encounter (Signed)
Spoke to patient Dr.Berry advised to start Amlodipine 5 mg daily.Advised to check B/P daily and bring readings to appointment.Scheduler will call back with appointment with our pharmacist [facility].

## 2019-02-12 NOTE — Telephone Encounter (Signed)
Spoke to patient he stated since he last saw Dr.Berry his B/P has been elevated.Readings listed below.Pulse has been 70,76,64.Stated he feels ok.Stated B/P was elevated when he saw Dr.Berry and Dr.Berry told him to monitor and call back if elevated.Advised Dr.Berry out of office today.I will send message to [facility] Dr.Christopher for advice.

## 2019-02-26 ENCOUNTER — Other Ambulatory Visit: Payer: Self-pay

## 2019-02-26 ENCOUNTER — Ambulatory Visit (INDEPENDENT_AMBULATORY_CARE_PROVIDER_SITE_OTHER): Payer: Medicare HMO | Admitting: Pharmacist

## 2019-02-26 DIAGNOSIS — I10 Essential (primary) hypertension: Secondary | ICD-10-CM

## 2019-02-26 MED ORDER — AMLODIPINE BESYLATE 10 MG PO TABS: 10.0000 mg | ORAL_TABLET | Freq: Every day | ORAL | 11 refills | Status: DC

## 2019-02-26 NOTE — Progress Notes (Signed)
Patient ID: George Cooper                 DOB: 1956/01/18                      MRN: [id number]     HPI: George Cooper is a 64 y.o. male referred by Dr. Allyson Sabal to HTN clinic. PMH is significant for CAD, PAD s/p peripheral angioplasty, directional atherectomy, PTA and stenting of proximal right SFA in 2014, subsequent peripheral angiography in 2017 with 95% stenosis beyond SFA stent treated with balloon angioplasty, former tobacco abuse, and HTN. Pt was seen most recently in the office on 01/23/19 with BP elevated at 142/72, no med changes were made. He called clinic on 12/22 with reports of elevated BP 160-180s/70-80s. He was started on amlodipine 5mg  daily and referred to HTN clinic for follow up.   Pt presents today in good spirits. He reports tolerating his amlodipine well and denies dizziness, falls, headache, blurred vision, and LEE. He purchased a bicep BP cuff after his last visit with Dr . Home readings have ranged 140-160s/70-80s. He checks his BP in his right arm around 4pm, has been taking his amlodipine in the morning when he wakes up. Denies additional stress, is currently teaching himself to play the guitar. Rarely uses NSAIDs or has pain. Started trying to cut back on sodium intake after his last appt in December.  Current HTN meds: amlodipine 5mg  daily   BP goal: <130/80mmHg  Family History: Mother had bypass surgery in her 15s, sister had coronary stenting  Social History: Former smoker for 40 years, quit in 2014, occasional marijuana, no alcohol.   Diet: Usually adds salt to food, has cut back since starting amlodipine. 2 cups of coffee each morning.  Exercise: Works part time for [employer]   Home BP readings:   Wt Readings from Last 3 Encounters:  01/23/19 167 lb (75.8 kg)  01/09/18 164 lb 6.4 oz (74.6 kg)  12/28/16 170 lb (77.1 kg)   BP Readings from Last 3 Encounters:  01/23/19 (!) 142/72  01/09/18 (!) 158/80  12/28/16 (!) 156/82   Pulse Readings from  Last 3 Encounters:  01/23/19 74  01/09/18 72  12/28/16 69    Renal function: CrCl cannot be calculated (Patient's most recent lab result is older than the maximum 21 days allowed.).  Past Medical History:  Diagnosis Date  . Arthritis    "right knee" (09/18/2012)  . Carotid artery disease (HCC)   . Claudication [facility])    a. 11/09/15 Hawk atherectomy of SFA  . Dizziness   . History of gout   . Hyperlipidemia   . Lyme disease 2006   "after bit by a tick" (09/18/2012)  . Renal artery stenosis (HCC)   . Tobacco abuse    discontinued October 2014    Current Outpatient Medications on File Prior to Visit  Medication Sig Dispense Refill  . Alirocumab (PRALUENT) 150 MG/ML SOAJ Inject 150 mg into the skin every 14 (fourteen) days. 2 pen 11  . amLODipine (NORVASC) 5 MG tablet Take 1 tablet (5 mg total) by mouth daily. 90 tablet 3  . aspirin EC 81 MG tablet Take 1 tablet (81 mg total) by mouth daily. 90 tablet 3  . clopidogrel (PLAVIX) 75 MG tablet TAKE 1 TABLET WITH BREAKFAST. 90 tablet 3  . naproxen sodium (ANAPROX) 220 MG tablet Take 220 mg by mouth daily as needed.    . pravastatin (PRAVACHOL) 20  MG tablet Take 10 mg by mouth daily.     No current facility-administered medications on file prior to visit.    No Known Allergies   Assessment/Plan:  1. Hypertension - BP remains above goal < 130/39mmHg. Will increase amlodipine to 10mg  daily. Encouraged pt to continue to monitor and record BP at home. He will work to limit sodium intake to 000mg  daily and will continue to stay active. Follow up in HTN clinic in 3 weeks. Can consider addition of ACEi or thiazide if additional BP lowering is warranted - would need BMET check as well since this has not been checked in 3 years.  Jenafer Winterton E. Jj Enyeart, PharmD, BCACP, [place] [address], [place], [place] [address] Phone: [phone]; Fax: [phone] 02/26/2019 10:37 AM

## 2019-02-26 NOTE — Patient Instructions (Addendum)
It was nice to meet you today  Increase your amlodipine from 5mg  to 10mg  once a day. You can take 2 of your 5mg  tablets each day until you run out, then pick up your new prescription for 10mg  tablets and take 1 tablet once a day  Continue to monitor your blood pressure at home  Limit your salt intake to < 2,000mg  daily  Stay active with exercise  Bring your readings to your follow up appointment in 3 weeks

## 2019-03-19 ENCOUNTER — Telehealth: Payer: Self-pay | Admitting: Pharmacist

## 2019-03-19 NOTE — Telephone Encounter (Signed)
Returned call to pt, changed appt to virtual. He has been monitoring BP readings and will have these available for tomorrow's appt.

## 2019-03-19 NOTE — Progress Notes (Signed)
Patient ID: George Cooper                 DOB: 1956-02-07                      MRN: [id number]     HPI: George Cooper a 64 y.o.malereferred by Dr. Sophronia Simas HTN clinic.PMH is significant for CAD, PAD s/p peripheral angioplasty, directional atherectomy, PTA and stenting of proximal right SFA in 2014, subsequent peripheral angiography in 2017 with 95% stenosis beyond SFA stent treated with balloon angioplasty, former tobacco abuse, and HTN.  At last clinic visit on 02/26/19, the patient brought home BP readings (checks at 4pm) that were elevated and ranged from 140-160s/70-80s. The patient denied additional stress, rarely uses NSAIDs, and was trying to limit sodium in diet. BP in clinic was 162/86 mmHg, so amlodipine was increased to 10 mg.   The patient presents today for a virtual visit via phone call. He reports feeling well on amlodipine and has noticed lower BP readings. He denies swelling, dizziness, and headache. He continues to decrease salt intake and the amount of caffeinated coffee he drinks. He changed over to [employer] [name]. He also stays active at his job.  Current HTN meds:amlodipine 10 mg daily  BP goal:<130/52mmHg  Family History:Mother had bypass surgery in her 45s, sister had coronary stenting  Social History:Former smoker for 40 years, quit in 2014, occasional marijuana, no alcohol.  Diet:Usually adds salt to food, has cut back since starting amlodipine. 2 cups of coffee each morning.  Exercise:Works part time for an [employer]  Home BP readings: Systolic mainly in 120's-130's/70-80s with only a few high readings. HR in high 60's-low 70's.  Wt Readings from Last 3 Encounters:  01/23/19 167 lb (75.8 kg)  01/09/18 164 lb 6.4 oz (74.6 kg)  12/28/16 170 lb (77.1 kg)   BP Readings from Last 3 Encounters:  02/26/19 (!) 162/86  01/23/19 (!) 142/72  01/09/18 (!) 158/80   Pulse Readings from Last 3 Encounters:  02/26/19 63  01/23/19 74  01/09/18 72      Renal function: CrCl cannot be calculated (Patient's most recent lab result is older than the maximum 21 days allowed.).  Past Medical History:  Diagnosis Date  . Arthritis    "right knee" (09/18/2012)  . Carotid artery disease (HCC)   . Claudication [facility])    a. 11/09/15 Hawk atherectomy of SFA  . Dizziness   . History of gout   . Hyperlipidemia   . Lyme disease 2006   "after bit by a tick" (09/18/2012)  . Renal artery stenosis (HCC)   . Tobacco abuse    discontinued October 2014    Current Outpatient Medications on File Prior to Visit  Medication Sig Dispense Refill  . Alirocumab (PRALUENT) 150 MG/ML SOAJ Inject 150 mg into the skin every 14 (fourteen) days. 2 pen 11  . amLODipine (NORVASC) 10 MG tablet Take 1 tablet (10 mg total) by mouth daily. 30 tablet 11  . aspirin EC 81 MG tablet Take 1 tablet (81 mg total) by mouth daily. 90 tablet 3  . clopidogrel (PLAVIX) 75 MG tablet TAKE 1 TABLET WITH BREAKFAST. 90 tablet 3  . naproxen sodium (ANAPROX) 220 MG tablet Take 220 mg by mouth daily as needed.    . pravastatin (PRAVACHOL) 20 MG tablet Take 10 mg by mouth daily.     No current facility-administered medications on file prior to visit.    No Known Allergies  Assessment/Plan:  1. Hypertension - Many home BP readings are at goal of < 130/80 mmHg and the patient continues to improve lifestyle changes. Will continue patient on amlodipine 10 mg daily. Will call patient via phone call in 2-3 weeks to follow up with BP readings to ensure they remain at goal.  Julieta Bellini, PharmD Candidate  Megan E. Supple, PharmD, BCACP, [place] [address] [place], [place] [address] Phone: [phone]; Fax: [phone] 03/20/2019 2:50 PM

## 2019-03-19 NOTE — Telephone Encounter (Signed)
Patient calling requesting to give his BP readings over the phone instead of come in to his appointment tomorrow.

## 2019-03-19 NOTE — Progress Notes (Unsigned)
Patient ID: George Cooper                 DOB: 1955-11-05                      MRN: [id number]     HPI: George Cooper is a 64 y.o. male referred by Dr. Allyson Sabal to HTN clinic. PMH is significant for CAD, PAD s/p peripheral angioplasty, directional atherectomy, PTA and stenting of proximal right SFA in 2014, subsequent peripheral angiography in 2017 with 95% stenosis beyond SFA stent treated with balloon angioplasty, former tobacco abuse, and HTN.  At last clinic visit on 02/26/19, the patient brought home BP readings (checks at 4pm) that were elevated and ranged from 140-160s/70-80s. The patient denies additional stress . rarely uses NSAIDs, and is trying to limit sodium in diet. BP in clinic was 162/86 mmHg, so increased amlodipine to 10 mg.   The patient arrives in HTN clinic for follow-up. How is amlodipine? Any swelling? Home BP readings? Limiting salt? Any exercise? If BP high, consider adding ACEi/ARB or thiazide. I would choose lisinopril 5 mg or valsartan 80 mg. If thiazide then HCTZ 12.5 mg. BMET today  Current HTN meds: amlodipine 5mg  daily   BP goal: <130/39mmHg  Family History: Mother had bypass surgery in her 75s, sister had coronary stenting  Social History: Former smoker for 40 years, quit in 2014, occasional marijuana, no alcohol.   Diet: Usually adds salt to food, has cut back since starting amlodipine. 2 cups of coffee each morning.  Exercise: Works part time for an [employer]   Home BP readings:   Wt Readings from Last 3 Encounters:  01/23/19 167 lb (75.8 kg)  01/09/18 164 lb 6.4 oz (74.6 kg)  12/28/16 170 lb (77.1 kg)   BP Readings from Last 3 Encounters:  02/26/19 (!) 162/86  01/23/19 (!) 142/72  01/09/18 (!) 158/80   Pulse Readings from Last 3 Encounters:  02/26/19 63  01/23/19 74  01/09/18 72    Renal function: CrCl cannot be calculated (Patient's most recent lab result is older than the maximum 21 days allowed.).  Past Medical History:    Diagnosis Date  . Arthritis    "right knee" (09/18/2012)  . Carotid artery disease (HCC)   . Claudication [facility])    a. 11/09/15 Hawk atherectomy of SFA  . Dizziness   . History of gout   . Hyperlipidemia   . Lyme disease 2006   "after bit by a tick" (09/18/2012)  . Renal artery stenosis (HCC)   . Tobacco abuse    discontinued October 2014    Current Outpatient Medications on File Prior to Visit  Medication Sig Dispense Refill  . Alirocumab (PRALUENT) 150 MG/ML SOAJ Inject 150 mg into the skin every 14 (fourteen) days. 2 pen 11  . amLODipine (NORVASC) 10 MG tablet Take 1 tablet (10 mg total) by mouth daily. 30 tablet 11  . aspirin EC 81 MG tablet Take 1 tablet (81 mg total) by mouth daily. 90 tablet 3  . clopidogrel (PLAVIX) 75 MG tablet TAKE 1 TABLET WITH BREAKFAST. 90 tablet 3  . naproxen sodium (ANAPROX) 220 MG tablet Take 220 mg by mouth daily as needed.    . pravastatin (PRAVACHOL) 20 MG tablet Take 10 mg by mouth daily.     No current facility-administered medications on file prior to visit.    No Known Allergies   Assessment/Plan:  1. Hypertension - BP remains above goal of  130/80. Start lisinopril 5 mg once daily. BMET today and will call with results tomorrow. Will follow-up in clinic 4 weeks.  Julieta Bellini, PharmD Candidate

## 2019-03-20 ENCOUNTER — Ambulatory Visit: Payer: Medicare HMO

## 2019-03-20 ENCOUNTER — Other Ambulatory Visit: Payer: Self-pay

## 2019-03-20 ENCOUNTER — Telehealth (INDEPENDENT_AMBULATORY_CARE_PROVIDER_SITE_OTHER): Payer: Medicare HMO | Admitting: Pharmacist

## 2019-03-20 VITALS — BP 131/82

## 2019-03-20 DIAGNOSIS — I10 Essential (primary) hypertension: Secondary | ICD-10-CM

## 2019-03-27 ENCOUNTER — Telehealth: Payer: Self-pay

## 2019-03-27 NOTE — Telephone Encounter (Signed)
Called and spoke w/pt about the issues that they reported having in obtaining the praluent 150mg  medication. I sent in a new pa and offered the pt a sample at the [place] office where they stated that they would like to pick it up.

## 2019-04-02 ENCOUNTER — Encounter (HOSPITAL_COMMUNITY): Payer: Medicare HMO

## 2019-04-09 ENCOUNTER — Telehealth: Payer: Self-pay | Admitting: Pharmacist

## 2019-04-09 NOTE — Telephone Encounter (Signed)
Called pt to follow up with BP readings - he reports systolic ranging 832-549I with occasional reading in the 140s. He is tolerating amlodipine 10mg  daily well. Will continue current meds and advised pt to call clinic if his systolic BP increases > 140 on a regular basis.

## 2019-04-12 ENCOUNTER — Ambulatory Visit (HOSPITAL_COMMUNITY): Payer: Medicare HMO

## 2019-04-18 ENCOUNTER — Ambulatory Visit (HOSPITAL_COMMUNITY)
Admission: RE | Admit: 2019-04-18 | Discharge: 2019-04-18 | Disposition: A | Discharge status: Home / Self Care | Payer: Medicare HMO | Source: Ambulatory Visit | Attending: Cardiology | Admitting: Cardiology

## 2019-04-18 ENCOUNTER — Other Ambulatory Visit: Payer: Self-pay

## 2019-04-18 ENCOUNTER — Encounter (INDEPENDENT_AMBULATORY_CARE_PROVIDER_SITE_OTHER): Payer: Self-pay

## 2019-04-18 ENCOUNTER — Other Ambulatory Visit (HOSPITAL_COMMUNITY): Payer: Self-pay | Admitting: Cardiovascular Disease

## 2019-04-18 DIAGNOSIS — I739 Peripheral vascular disease, unspecified: Secondary | ICD-10-CM | POA: Diagnosis present

## 2019-04-18 DIAGNOSIS — I6523 Occlusion and stenosis of bilateral carotid arteries: Secondary | ICD-10-CM | POA: Insufficient documentation

## 2019-04-18 DIAGNOSIS — Z95820 Peripheral vascular angioplasty status with implants and grafts: Secondary | ICD-10-CM

## 2019-05-15 ENCOUNTER — Ambulatory Visit (INDEPENDENT_AMBULATORY_CARE_PROVIDER_SITE_OTHER): Payer: Medicare HMO | Admitting: Cardiovascular Disease

## 2019-05-15 ENCOUNTER — Encounter: Payer: Self-pay | Admitting: Cardiovascular Disease

## 2019-05-15 ENCOUNTER — Other Ambulatory Visit: Payer: Self-pay

## 2019-05-15 VITALS — BP 132/74 | HR 64 | Ht 69.5 in | Wt 170.0 lb

## 2019-05-15 DIAGNOSIS — I739 Peripheral vascular disease, unspecified: Secondary | ICD-10-CM

## 2019-05-15 NOTE — Patient Instructions (Addendum)
    [facility] CARDIOVASCULAR DIVISION [facility] NORTHLINE [address] [place] [address] [place] [address] Dept: [phone] Loc: [phone]  George Cooper  05/15/2019  You are scheduled for a Peripheral Angiogram on Monday, April 26 with Dr. Nanetta Batty.  1. Please arrive at the [facility] (Main Entrance A) at [facility]: [address] [place], [place] [address] at 5:30 AM (This time is two hours before your procedure to ensure your preparation). Free valet parking service is available.   Special note: Every effort is made to have your procedure done on time. Please understand that emergencies sometimes delay scheduled procedures.  2. Diet: Do not eat solid foods after midnight.  The patient may have clear liquids until 5am upon the day of the procedure.  3. Labs: You will need to have blood drawn on Monday, April 19 at [facility] [address] [facility] [address], [place]  Open: 8am - 5pm (Lunch 12:30 - 1:30)   Phone: [phone]. You do not need to be fasting.  4. Medication instructions in preparation for your procedure:   Contrast Allergy: No  On the morning of your procedure, take your Aspirin and Plavix and any morning medicines NOT listed above.  You may use sips of water.  COVID Screening: Thursday, 4/22 at 10:00 AM at [facility]  5. Plan for one night stay--bring personal belongings. 6. Bring a current list of your medications and current insurance cards. 7. You MUST have a responsible person to drive you home. 8. Someone MUST be with you the first 24 hours after you arrive home or your discharge will be delayed. 9. Please wear clothes that are easy to get on and off and wear slip-on shoes.  Thank you for allowing [place] to care for you!   --  Invasive Cardiovascular services  Follow-up Testing:  Your physician has requested that you have a lower extremity arterial duplex. During this test, ultrasound is  used to evaluate arterial blood flow in the legs. Allow one hour for this exam. There are no restrictions or special instructions.  Your physician has requested that you have an ankle brachial index (ABI). During this test an ultrasound and blood pressure cuff are used to evaluate the arteries that supply the arms and legs with blood. Allow thirty minutes for this exam. There are no restrictions or special instructions.  Follow-up Appointment:  Your physician recommends that you schedule a follow-up appointment in: 2 weeks after procedure with Dr. Allyson Sabal.

## 2019-05-15 NOTE — Progress Notes (Signed)
05/15/2019 KESEAN SERVISS   [birth year]  [id number]  Primary Physician Assunta Found, MD Primary Cardiologist: Runell Gess MD Nicholes Calamity, [place]  HPI:  George Cooper is a 64 y.o.  thin appearing married Caucasian male father of 2, grandfather of 2 grandchildren works as an Personnel officer. He was referred by Dr. Yetta Numbers , at [facility], in [place] for evaluation of dizziness and carotid artery disease. I last saw him in the office  01/23/2019.Marland KitchenHe denies chest pain or shortness of breath. He did complain however of right calf claudication. His cardiovascular risk profile is positive for a 20-pack-year history of tobacco abuse discontinued 10/14. He is not diabetic, hypertensive or hyperlipidemic. His mother did have bypass surgery in her 44s and his sister has had coronary stents as well.A stress test which was normal. Lower extremity arterial Dopplers revealed a high-frequency signal in the proximal segment of his right SFA. On 09/18/12 the patient underwent peripheral angiography, directional atherectomy, PTA and stenting of a high-grade segmental proximal right SFA stenosis. He had excellent angiographic and clinical result. His Doppler show a widely patent proximal right SFA and his claudication has resolved. His lower extremity arterial Doppler studies performed 05/14/13 revealed a widely patent stent with ABIs 1 bilaterally. He complains of mild right calf claudication with significant exertion. Since I saw him last he remained stable. He denies chest pain, shortness of breath . Lower extremity Dopplers performed 08/27/15 revealed a decline in his right ABI from one down to 0.91 with a new high-frequency signal in his distal right SFA. He underwent peripheral angiography by myself 11/09/15 feeling a patent mid right SFA stent with a 95% stenosis just beyond this. I performed one directional atherectomy followed by drug-eluting balloon angioplasty with excellent angiographic and  clinical result. His claudication has completely resolved as Dopplers performed on 11/11/15 have normalized. He was admitted to the hospital on 11/16/15 with a transient neurologic event with negative workup.Since I saw him in the office in October of last year he was remained stable. He denies chest pain, shortness of breath or claudication. He is on Repatha with an excellent clinical result. His most recent lipid profile performed12/31/2018revealed total cholesterol of 82, LDL 23 and HDL 43.He had follow-up lower extremity arterial Doppler studies performed 08/31/16 revealing a new high frequency signal in his mid right SFA although he denies significant claudication.  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  He has developed lifestyle limiting claudication on the right side and wishes to proceed with angiography and endovascular therapy.    Current Meds  Medication Sig   Alirocumab (PRALUENT) 150 MG/ML SOAJ Inject 150 mg into the skin every 14 (fourteen) days.   amLODipine (NORVASC) 10 MG tablet Take 1 tablet (10 mg total) by mouth daily.   aspirin EC 81 MG tablet Take 1 tablet (81 mg total) by mouth daily.   clopidogrel (PLAVIX) 75 MG tablet TAKE 1 TABLET WITH BREAKFAST.   naproxen sodium (ANAPROX) 220 MG tablet Take 220 mg by mouth daily as needed.   pravastatin (PRAVACHOL) 10 MG tablet Take 10 mg by mouth daily.   [DISCONTINUED] pravastatin (PRAVACHOL) 20 MG tablet Take 10 mg by mouth daily.     No Known Allergies  Social History   Socioeconomic History   Marital status: Married  Spouse name: Not on file   Number of children: Not on file   Years of education: Not on file   Highest education level: Not on file  Occupational History   Not on file  Tobacco Use   Smoking status: Former Smoker     Packs/day: 0.20    Years: 40.00    Pack years: 8.00    Types: Cigarettes    Quit date: 11/29/2012    Years since quitting: 6.4   Smokeless tobacco: Never Used   Tobacco comment: trying to quit, smokes 2-3 daily  Substance and Sexual Activity   Alcohol use: No    Alcohol/week: 0.0 standard drinks   Drug use: Yes    Types: Marijuana    Comment: "every once in a while"   Sexual activity: Not on file  Other Topics Concern   Not on file  Social History Narrative   Not on file   Social Determinants of Health   Financial Resource Strain:    Difficulty of Paying Living Expenses:   Food Insecurity:    Worried About Programme researcher, broadcasting/film/video in the Last Year:    Barista in the Last Year:   Transportation Needs:    Freight forwarder (Medical):    Lack of Transportation (Non-Medical):   Physical Activity:    Days of Exercise per Week:    Minutes of Exercise per Session:   Stress:    Feeling of Stress :   Social Connections:    Frequency of Communication with Friends and Family:    Frequency of Social Gatherings with Friends and Family:    Attends Religious Services:    Active Member of Clubs or Organizations:    Attends Engineer, structural:    Marital Status:   Intimate Partner Violence:    Fear of Current or Ex-Partner:    Emotionally Abused:    Physically Abused:    Sexually Abused:      Review of Systems: General: negative for chills, fever, night sweats or weight changes.  Cardiovascular: negative for chest pain, dyspnea on exertion, edema, orthopnea, palpitations, paroxysmal nocturnal dyspnea or shortness of breath Dermatological: negative for rash Respiratory: negative for cough or wheezing Urologic: negative for hematuria Abdominal: negative for nausea, vomiting, diarrhea, bright red blood per rectum, melena, or hematemesis Neurologic: negative for visual changes, syncope, or dizziness All other systems reviewed and are  otherwise negative except as noted above.    Blood pressure 132/74, pulse 64, height 5' 9.5" (1.765 m), weight 170 lb (77.1 kg).  General appearance: alert and no distress Neck: no adenopathy, no carotid bruit, no JVD, supple, symmetrical, trachea midline and thyroid not enlarged, symmetric, no tenderness/mass/nodules Lungs: clear to auscultation bilaterally Heart: regular rate and rhythm, S1, S2 normal, no murmur, click, rub or gallop Extremities: extremities normal, atraumatic, no cyanosis or edema Pulses: 2+ and symmetric Skin: Skin color, texture, turgor normal. No rashes or lesions Neurologic: Alert and oriented X 3, normal strength and tone. Normal symmetric reflexes. Normal coordination and gait  EKG not performed today  ASSESSMENT AND PLAN:   PAD (peripheral artery disease), s/p athrectomy, PTA/ stent Rt. SFA  09/18/2012 Mr. Frerking returns today for follow-up of PAD.  He has developed lifestyle limiting right calf claudication.  I initially intervened on his right SFA using a self-expanding stent 09/18/2012.  I reangiogrammed him 11/09/2015 revealing a patent stent with a high-grade lesion just distal to this which I intervened on as well.  Three-vessel  runoff.  Recent lower extremity Doppler studies performed 04/19/2019 revealed a right ABI of 0.88 and a left of 1.18.  He did have a high-frequency signal in his right SFA stent suggesting "in-stent restenosis and wishes to proceed with angiography and endovascular therapy.      Lorretta Harp MD FACP,FACC,FAHA, [facility] 05/15/2019 9:18 AM

## 2019-05-15 NOTE — Assessment & Plan Note (Signed)
George Cooper returns today for follow-up of PAD.  He has developed lifestyle limiting right calf claudication.  I initially intervened on his right SFA using a self-expanding stent 09/18/2012.  I reangiogrammed him 11/09/2015 revealing a patent stent with a high-grade lesion just distal to this which I intervened on as well.  Three-vessel runoff.  Recent lower extremity Doppler studies performed 04/19/2019 revealed a right ABI of 0.88 and a left of 1.18.  He did have a high-frequency signal in his right SFA stent suggesting "in-stent restenosis and wishes to proceed with angiography and endovascular therapy.

## 2019-05-16 ENCOUNTER — Other Ambulatory Visit: Payer: Self-pay | Admitting: Cardiovascular Disease

## 2019-05-16 DIAGNOSIS — I739 Peripheral vascular disease, unspecified: Secondary | ICD-10-CM

## 2019-05-30 DIAGNOSIS — E663 Overweight: Secondary | ICD-10-CM | POA: Diagnosis not present

## 2019-05-30 DIAGNOSIS — Z6825 Body mass index (BMI) 25.0-25.9, adult: Secondary | ICD-10-CM | POA: Diagnosis not present

## 2019-05-30 DIAGNOSIS — Z1389 Encounter for screening for other disorder: Secondary | ICD-10-CM | POA: Diagnosis not present

## 2019-05-30 DIAGNOSIS — S76312A Strain of muscle, fascia and tendon of the posterior muscle group at thigh level, left thigh, initial encounter: Secondary | ICD-10-CM | POA: Diagnosis not present

## 2019-05-30 DIAGNOSIS — Z0001 Encounter for general adult medical examination with abnormal findings: Secondary | ICD-10-CM | POA: Diagnosis not present

## 2019-06-06 ENCOUNTER — Telehealth: Payer: Self-pay | Admitting: Cardiovascular Disease

## 2019-06-06 NOTE — Telephone Encounter (Signed)
Patient is going for COVID screening on 4/22 in [facility], he wants to know if he can go to the [place] location to get screened.  He only lives 2 miles away from that location.

## 2019-06-06 NOTE — Telephone Encounter (Signed)
Called and spoke with pt, able to schedule him to have covid screening at [facility] on 4/22 at 2:15pm. Pt verbalized understanding with no other questions at this time.

## 2019-06-07 ENCOUNTER — Telehealth: Payer: Self-pay | Admitting: Cardiovascular Disease

## 2019-06-07 NOTE — Telephone Encounter (Signed)
Please advise of this issue.   Will route to PharmD.

## 2019-06-07 NOTE — Telephone Encounter (Signed)
Spoke with patient.  Advised that he try other local pharmacies ([facility], [facility], [place]) to see if they have in stock or can order from their suppliers.   Patient voiced understanding

## 2019-06-07 NOTE — Telephone Encounter (Signed)
Pt c/o medication issue:  1. Name of Medication: Alirocumab (PRALUENT) 150 MG/ML SOAJ  2. How are you currently taking this medication (dosage and times per day)? Not currently taking medication   3. Are you having a reaction (difficulty breathing--STAT)? No   4. What is your medication issue? George Cooper is calling stating he tried to pick up this prescription and the pharmacy stated they currently do not have this medication and that it is on back order. They do not know when they will receive it. George Cooper states he wanted to make Dr. Allyson Sabal and the Pharmacist who prescribed him the medication aware of this and see what they advised. Whether it be an alternative medication or samples until they get some in. He has not taking this medication since yesterday. Please advise.

## 2019-06-10 DIAGNOSIS — R7309 Other abnormal glucose: Secondary | ICD-10-CM | POA: Diagnosis not present

## 2019-06-10 DIAGNOSIS — Z0000 Encounter for general adult medical examination without abnormal findings: Secondary | ICD-10-CM | POA: Diagnosis not present

## 2019-06-10 DIAGNOSIS — Z0001 Encounter for general adult medical examination with abnormal findings: Secondary | ICD-10-CM | POA: Diagnosis not present

## 2019-06-13 ENCOUNTER — Other Ambulatory Visit (HOSPITAL_COMMUNITY)
Admission: RE | Admit: 2019-06-13 | Discharge: 2019-06-13 | Disposition: A | Discharge status: Home / Self Care | Payer: Medicare HMO | Source: Ambulatory Visit | Attending: Cardiovascular Disease | Admitting: Cardiovascular Disease

## 2019-06-13 ENCOUNTER — Other Ambulatory Visit: Payer: Self-pay

## 2019-06-13 ENCOUNTER — Other Ambulatory Visit (HOSPITAL_COMMUNITY): Payer: Medicare HMO

## 2019-06-13 DIAGNOSIS — Z01812 Encounter for preprocedural laboratory examination: Secondary | ICD-10-CM | POA: Insufficient documentation

## 2019-06-13 DIAGNOSIS — Z20822 Contact with and (suspected) exposure to covid-19: Secondary | ICD-10-CM | POA: Diagnosis not present

## 2019-06-14 LAB — SARS CORONAVIRUS 2 (TAT 6-24 HRS): SARS Coronavirus 2: NEGATIVE

## 2019-06-17 ENCOUNTER — Ambulatory Visit (HOSPITAL_COMMUNITY)
Admission: RE | Admit: 2019-06-17 | Discharge: 2019-06-17 | Disposition: A | Discharge status: Home / Self Care | Payer: Medicare HMO | Attending: Cardiovascular Disease | Admitting: Cardiovascular Disease

## 2019-06-17 ENCOUNTER — Encounter (HOSPITAL_COMMUNITY)
Admission: RE | Disposition: A | Discharge status: Home / Self Care | Payer: Self-pay | Source: Home / Self Care | Attending: Cardiovascular Disease

## 2019-06-17 ENCOUNTER — Other Ambulatory Visit: Payer: Self-pay

## 2019-06-17 DIAGNOSIS — Z7982 Long term (current) use of aspirin: Secondary | ICD-10-CM | POA: Diagnosis not present

## 2019-06-17 DIAGNOSIS — I739 Peripheral vascular disease, unspecified: Secondary | ICD-10-CM | POA: Diagnosis present

## 2019-06-17 DIAGNOSIS — I70211 Atherosclerosis of native arteries of extremities with intermittent claudication, right leg: Secondary | ICD-10-CM | POA: Insufficient documentation

## 2019-06-17 DIAGNOSIS — Z79899 Other long term (current) drug therapy: Secondary | ICD-10-CM | POA: Insufficient documentation

## 2019-06-17 DIAGNOSIS — Z87891 Personal history of nicotine dependence: Secondary | ICD-10-CM | POA: Insufficient documentation

## 2019-06-17 DIAGNOSIS — Z7902 Long term (current) use of antithrombotics/antiplatelets: Secondary | ICD-10-CM | POA: Diagnosis not present

## 2019-06-17 HISTORY — PX: PERIPHERAL VASCULAR INTERVENTION: CATH118257

## 2019-06-17 HISTORY — PX: ABDOMINAL AORTOGRAM W/LOWER EXTREMITY: CATH118223

## 2019-06-17 LAB — BASIC METABOLIC PANEL
Anion gap: 8 (ref 5–15)
BUN: 19 mg/dL (ref 8–23)
CO2: 24 mmol/L (ref 22–32)
Calcium: 8.5 mg/dL — ABNORMAL LOW (ref 8.9–10.3)
Chloride: 104 mmol/L (ref 98–111)
Creatinine, Ser: 1.21 mg/dL (ref 0.61–1.24)
GFR calc Af Amer: >60 mL/min (ref >60)
GFR calc non Af Amer: >60 mL/min (ref >60)
Glucose, Bld: 117 mg/dL — ABNORMAL HIGH (ref 70–99)
Potassium: 3.3 mmol/L — ABNORMAL LOW (ref 3.5–5.1)
Sodium: 136 mmol/L (ref 135–145)

## 2019-06-17 LAB — CBC
HCT: 42 % (ref 39.0–52.0)
Hemoglobin: 14.4 g/dL (ref 13.0–17.0)
MCH: 29 pg (ref 26.0–34.0)
MCHC: 34.3 g/dL (ref 30.0–36.0)
MCV: 84.5 fL (ref 80.0–100.0)
Platelets: 207 10*3/uL (ref 150–400)
RBC: 4.97 MIL/uL (ref 4.22–5.81)
RDW: 12.5 % (ref 11.5–15.5)
WBC: 7.3 10*3/uL (ref 4.0–10.5)
nRBC: 0 % (ref 0.0–0.2)

## 2019-06-17 LAB — POCT ACTIVATED CLOTTING TIME
Activated Clotting Time: 263 seconds
Activated Clotting Time: 279 seconds

## 2019-06-17 SURGERY — ABDOMINAL AORTOGRAM W/LOWER EXTREMITY
Anesthesia: LOCAL | Laterality: Right

## 2019-06-17 MED ORDER — SODIUM CHLORIDE 0.9 % WEIGHT BASED INFUSION: 1.0000 mL/kg/h | INTRAVENOUS | Status: DC

## 2019-06-17 MED ORDER — IODIXANOL 320 MG/ML IV SOLN
INTRAVENOUS | Status: DC | PRN
  Administered 2019-06-17: 10:00:00 170 mL via INTRA_ARTERIAL

## 2019-06-17 MED ORDER — CLOPIDOGREL BISULFATE 75 MG PO TABS: 75.0000 mg | ORAL_TABLET | Freq: Every day | ORAL | Status: DC

## 2019-06-17 MED ORDER — SODIUM CHLORIDE 0.9% FLUSH: 3.0000 mL | INTRAVENOUS | Status: DC | PRN

## 2019-06-17 MED ORDER — POTASSIUM CHLORIDE CRYS ER 20 MEQ PO TBCR
EXTENDED_RELEASE_TABLET | ORAL | Status: AC
  Filled 2019-06-17: qty 2

## 2019-06-17 MED ORDER — PRAVASTATIN SODIUM 10 MG PO TABS: 10.0000 mg | ORAL_TABLET | Freq: Every day | ORAL | Status: DC

## 2019-06-17 MED ORDER — ONDANSETRON HCL 4 MG/2ML IJ SOLN
4.0000 mg | Freq: Four times a day (QID) | INTRAMUSCULAR | Status: DC | PRN
  Administered 2019-06-17: 4 mg via INTRAVENOUS

## 2019-06-17 MED ORDER — MIDAZOLAM HCL 2 MG/2ML IJ SOLN
INTRAMUSCULAR | Status: AC
  Filled 2019-06-17: qty 2

## 2019-06-17 MED ORDER — SODIUM CHLORIDE 0.9 % WEIGHT BASED INFUSION
3.0000 mL/kg/h | INTRAVENOUS | Status: DC
  Administered 2019-06-17: 3 mL/kg/h via INTRAVENOUS

## 2019-06-17 MED ORDER — LIDOCAINE HCL (PF) 1 % IJ SOLN
INTRAMUSCULAR | Status: AC
  Filled 2019-06-17: qty 30

## 2019-06-17 MED ORDER — ASPIRIN 81 MG PO CHEW: 81.0000 mg | CHEWABLE_TABLET | ORAL | Status: DC

## 2019-06-17 MED ORDER — ASPIRIN EC 81 MG PO TBEC: 81.0000 mg | DELAYED_RELEASE_TABLET | Freq: Every day | ORAL | Status: DC

## 2019-06-17 MED ORDER — ONDANSETRON HCL 4 MG/2ML IJ SOLN
INTRAMUSCULAR | Status: AC
  Filled 2019-06-17: qty 2

## 2019-06-17 MED ORDER — SODIUM CHLORIDE 0.9 % IV SOLN: INTRAVENOUS | Status: DC

## 2019-06-17 MED ORDER — HYDRALAZINE HCL 20 MG/ML IJ SOLN
INTRAMUSCULAR | Status: AC
  Filled 2019-06-17: qty 1

## 2019-06-17 MED ORDER — SODIUM CHLORIDE 0.9% FLUSH: 3.0000 mL | Freq: Two times a day (BID) | INTRAVENOUS | Status: DC

## 2019-06-17 MED ORDER — MIDAZOLAM HCL 2 MG/2ML IJ SOLN
INTRAMUSCULAR | Status: DC | PRN
  Administered 2019-06-17 (×2): 1 mg via INTRAVENOUS

## 2019-06-17 MED ORDER — SODIUM CHLORIDE 0.9 % IV SOLN: 250.0000 mL | INTRAVENOUS | Status: DC | PRN

## 2019-06-17 MED ORDER — ACETAMINOPHEN 325 MG PO TABS: 650.0000 mg | ORAL_TABLET | ORAL | Status: DC | PRN

## 2019-06-17 MED ORDER — AMLODIPINE BESYLATE 10 MG PO TABS: 10.0000 mg | ORAL_TABLET | Freq: Every day | ORAL | Status: DC

## 2019-06-17 MED ORDER — HEPARIN (PORCINE) IN NACL 1000-0.9 UT/500ML-% IV SOLN
INTRAVENOUS | Status: DC | PRN
  Administered 2019-06-17 (×2): 500 mL

## 2019-06-17 MED ORDER — LIDOCAINE HCL (PF) 1 % IJ SOLN
INTRAMUSCULAR | Status: DC | PRN
  Administered 2019-06-17: 25 mL via INTRADERMAL

## 2019-06-17 MED ORDER — HEPARIN SODIUM (PORCINE) 1000 UNIT/ML IJ SOLN
INTRAMUSCULAR | Status: DC | PRN
  Administered 2019-06-17: 4000 [IU] via INTRAVENOUS
  Administered 2019-06-17: 9000 [IU] via INTRAVENOUS

## 2019-06-17 MED ORDER — POTASSIUM CHLORIDE CRYS ER 20 MEQ PO TBCR
40.0000 meq | EXTENDED_RELEASE_TABLET | Freq: Once | ORAL | Status: AC
  Administered 2019-06-17: 40 meq via ORAL
  Filled 2019-06-17: qty 2

## 2019-06-17 MED ORDER — HEPARIN SODIUM (PORCINE) 1000 UNIT/ML IJ SOLN
INTRAMUSCULAR | Status: AC
  Filled 2019-06-17: qty 1

## 2019-06-17 MED ORDER — LABETALOL HCL 5 MG/ML IV SOLN
10.0000 mg | INTRAVENOUS | Status: DC | PRN
  Administered 2019-06-17: 10 mg via INTRAVENOUS

## 2019-06-17 MED ORDER — LABETALOL HCL 5 MG/ML IV SOLN
INTRAVENOUS | Status: AC
  Filled 2019-06-17: qty 4

## 2019-06-17 MED ORDER — FENTANYL CITRATE (PF) 100 MCG/2ML IJ SOLN
INTRAMUSCULAR | Status: AC
  Filled 2019-06-17: qty 2

## 2019-06-17 MED ORDER — HYDRALAZINE HCL 20 MG/ML IJ SOLN
5.0000 mg | INTRAMUSCULAR | Status: DC | PRN
  Administered 2019-06-17: 5 mg via INTRAVENOUS

## 2019-06-17 MED ORDER — ONDANSETRON HCL 4 MG/2ML IJ SOLN
INTRAMUSCULAR | Status: DC | PRN
  Administered 2019-06-17: 4 mg via INTRAVENOUS

## 2019-06-17 MED ORDER — FENTANYL CITRATE (PF) 100 MCG/2ML IJ SOLN
INTRAMUSCULAR | Status: DC | PRN
  Administered 2019-06-17: 25 ug via INTRAVENOUS
  Administered 2019-06-17: 50 ug via INTRAVENOUS
  Administered 2019-06-17: 25 ug via INTRAVENOUS

## 2019-06-17 MED ORDER — HEPARIN (PORCINE) IN NACL 1000-0.9 UT/500ML-% IV SOLN
INTRAVENOUS | Status: AC
  Filled 2019-06-17: qty 1000

## 2019-06-17 MED ORDER — PRAVASTATIN SODIUM 10 MG PO TABS
10.0000 mg | ORAL_TABLET | ORAL | Status: DC
  Filled 2019-06-17: qty 1

## 2019-06-17 SURGICAL SUPPLY — 23 items
BAG SNAP BAND KOVER 36X36 (MISCELLANEOUS) ×1 IMPLANT
BALLN CHOCOLATE 4.0X40X135 (BALLOONS) ×3
BALLN STERLING OTW 5X80X135 (BALLOONS) ×3
BALLOON CHOCOLATE 4.0X40X135 (BALLOONS) IMPLANT
BALLOON STERLING OTW 5X80X135 (BALLOONS) IMPLANT
CATH ANGIO 5F PIGTAIL 65CM (CATHETERS) ×1 IMPLANT
CATH CROSS OVER TEMPO 5F (CATHETERS) ×1 IMPLANT
CLOSURE MYNX CONTROL 6F/7F (Vascular Products) ×1 IMPLANT
DEVICE SPIDERFX EMB PROT 6MM (WIRE) ×1 IMPLANT
GUIDEWIRE ZILIENT 6G 014 (WIRE) ×1 IMPLANT
KIT ENCORE 26 ADVANTAGE (KITS) ×1 IMPLANT
KIT PV (KITS) ×3 IMPLANT
SHEATH HIGHFLEX ANSEL 6FRX55 (SHEATH) ×1 IMPLANT
SHEATH PINNACLE 5F 10CM (SHEATH) ×1 IMPLANT
SHEATH PINNACLE 6F 10CM (SHEATH) ×1 IMPLANT
STENT ELUVIA 6X80X130 (Permanent Stent) ×1 IMPLANT
STOPCOCK MORSE 400PSI 3WAY (MISCELLANEOUS) ×1 IMPLANT
SYR MEDRAD MARK 7 150ML (SYRINGE) ×3 IMPLANT
TRANSDUCER W/STOPCOCK (MISCELLANEOUS) ×3 IMPLANT
TRAY PV CATH (CUSTOM PROCEDURE TRAY) ×3 IMPLANT
TUBING CIL FLEX 10 FLL-RA (TUBING) ×1 IMPLANT
WIRE HITORQ VERSACORE ST 145CM (WIRE) ×1 IMPLANT
WIRE ROSEN-J .035X180CM (WIRE) ×1 IMPLANT

## 2019-06-17 NOTE — Interval H&P Note (Signed)
History and Physical Interval Note:  06/17/2019 7:52 AM  George Cooper  has presented today for surgery, with the diagnosis of claudication.  The various methods of treatment have been discussed with the patient and family. After consideration of risks, benefits and other options for treatment, the patient has consented to  Procedure(s): ABDOMINAL AORTOGRAM W/LOWER EXTREMITY (N/A) as a surgical intervention.  The patient's history has been reviewed, patient examined, no change in status, stable for surgery.  I have reviewed the patient's chart and labs.  Questions were answered to the patient's satisfaction.     Nanetta Batty

## 2019-06-17 NOTE — H&P (Addendum)
06/17/19 George Cooper  [birth year]  [id number]  Primary Physician Assunta Found, MD  Primary Cardiologist: Runell Gess MD Nicholes Calamity, [place]  HPI: George Cooper is a 64 y.o. thin appearing married Caucasian male father of 2, grandfather of 2 grandchildren works as an Personnel officer. He was referred by Dr. Yetta Numbers , at [facility], in [place] for evaluation of dizziness and carotid artery disease. I last saw him in the office 01/23/2019.Marland KitchenHe denies chest pain or shortness of breath. He did complain however of right calf claudication. His cardiovascular risk profile is positive for a 20-pack-year history of tobacco abuse discontinued 10/14. He is not diabetic, hypertensive or hyperlipidemic. His mother did have bypass surgery in her 6s and his sister has had coronary stents as well. A stress test which was normal. Lower extremity arterial Dopplers revealed a high-frequency signal in the proximal segment of his right SFA. On 09/18/12 the patient underwent peripheral angiography, directional atherectomy, PTA and stenting of a high-grade segmental proximal right SFA stenosis. He had excellent angiographic and clinical result. His Doppler show a widely patent proximal right SFA and his claudication has resolved. His lower extremity arterial Doppler studies performed 05/14/13 revealed a widely patent stent with ABIs 1 bilaterally. He complains of mild right calf claudication with significant exertion. Since I saw him last he remained stable. He denies chest pain, shortness of breath . Lower extremity Dopplers performed 08/27/15 revealed a decline in his right ABI from one down to 0.91 with a new high-frequency signal in his distal right SFA. He underwent peripheral angiography by myself 11/09/15 feeling a patent mid right SFA stent with a 95% stenosis just beyond this. I performed one directional atherectomy followed by drug-eluting balloon angioplasty with excellent angiographic and clinical  result. His claudication has completely resolved as Dopplers performed on 11/11/15 have normalized. He was admitted to the hospital on 11/16/15 with a transient neurologic event with negative workup. Since I saw him in the office in October of last year he was remained stable. He denies chest pain, shortness of breath or claudication. He is on Repatha with an excellent clinical result. His most recent lipid profile performed 02/20/2017 revealed total cholesterol of 82, LDL 23 and HDL 43. He had follow-up lower extremity arterial Doppler studies performed 08/31/16 revealing a new high frequency signal in his mid right SFA although he denies significant claudication.  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 He has developed lifestyle limiting claudication on the right side and wishes to proceed with angiography and endovascular therapy.   Active Medications     Active Medications      Current Meds  Medication Sig  . Alirocumab (PRALUENT) 150 MG/ML SOAJ Inject 150 mg into the skin every 14 (fourteen) days.  [name] amLODipine (NORVASC) 10 MG tablet Take 1 tablet (10 mg total) by mouth daily.  [name] aspirin EC 81 MG tablet Take 1 tablet (81 mg total) by mouth daily.  . clopidogrel (PLAVIX) 75 MG tablet TAKE 1 TABLET WITH BREAKFAST.  . naproxen sodium (ANAPROX) 220 MG tablet Take 220 mg by mouth daily as needed.  . pravastatin (PRAVACHOL) 10 MG tablet Take 10 mg by mouth daily.  . [DISCONTINUED] pravastatin (PRAVACHOL) 20 MG tablet Take 10 mg by mouth daily.       No Known Allergies  Social  History   Socioeconomic History  . Marital status: Married    Spouse name: Not on file  . Number of children: Not on file  . Years of education: Not on file  . Highest education level: Not on file  Occupational History  . Not on file    Tobacco Use  . Smoking status: Former Smoker    Packs/day: 0.20    Years: 40.00    Pack years: 8.00    Types: Cigarettes    Quit date: 11/29/2012    Years since quitting: 6.4  . Smokeless tobacco: Never Used  . Tobacco comment: trying to quit, smokes 2-3 daily  Substance and Sexual Activity  . Alcohol use: No    Alcohol/week: 0.0 standard drinks  . Drug use: Yes    Types: Marijuana    Comment: "every once in a while"  . Sexual activity: Not on file  Other Topics Concern  . Not on file  Social History Narrative  . Not on file   Social Determinants of Health      Financial Resource Strain:   . Difficulty of Paying Living Expenses:   Food Insecurity:   . Worried About Charity fundraiser in the Last Year:   . Arboriculturist in the Last Year:   Transportation Needs:   . Film/video editor (Medical):   [name] Lack of Transportation (Non-Medical):   Physical Activity:   . Days of Exercise per Week:   . Minutes of Exercise per Session:   Stress:   . Feeling of Stress :   Social Connections:   . Frequency of Communication with Friends and Family:   . Frequency of Social Gatherings with Friends and Family:   . Attends Religious Services:   . Active Member of Clubs or Organizations:   . Attends Archivist Meetings:   [name] Marital Status:   Intimate Partner Violence:   . Fear of Current or Ex-Partner:   . Emotionally Abused:   [name] Physically Abused:   . Sexually Abused:      Review of Systems: General: negative for chills, fever, night sweats or weight changes.  Cardiovascular: negative for chest pain, dyspnea on exertion, edema, orthopnea, palpitations, paroxysmal nocturnal dyspnea or shortness of breath Dermatological: negative for rash Respiratory: negative for cough or wheezing Urologic: negative for hematuria Abdominal: negative for nausea, vomiting, diarrhea, bright red blood per rectum, melena, or hematemesis Neurologic: negative for  visual changes, syncope, or dizziness All other systems reviewed and are otherwise negative except as noted above.    Blood pressure 132/74, pulse 64, height 5' 9.5" (1.765 m), weight 170 lb (77.1 kg).  General appearance: alert and no distress Neck: no adenopathy, no carotid bruit, no JVD, supple, symmetrical, trachea midline and thyroid not enlarged, symmetric, no tenderness/mass/nodules Lungs: clear to auscultation bilaterally Heart: regular rate and rhythm, S1, S2 normal, no murmur, click, rub or gallop Extremities: extremities normal, atraumatic, no cyanosis or edema Pulses: 2+ and symmetric Skin: Skin color, texture, turgor normal. No rashes or lesions Neurologic: Alert and oriented X 3, normal strength and tone. Normal symmetric reflexes. Normal coordination and gait  EKG not performed today  ASSESSMENT AND PLAN:   PAD (peripheral artery disease), s/p athrectomy, PTA/ stent Rt. SFA  09/18/2012 Mr. Suppes returns today for follow-up of PAD.  He has developed lifestyle limiting right calf claudication.  I initially intervened on his right SFA using a self-expanding stent 09/18/2012.  I reangiogrammed him 11/09/2015 revealing a patent  stent with a high-grade lesion just distal to this which I intervened on as well.  Three-vessel runoff.  Recent lower extremity Doppler studies performed 04/19/2019 revealed a right ABI of 0.88 and a left of 1.18.  He did have a high-frequency signal in his right SFA stent suggesting "in-stent restenosis and wishes to proceed with angiography and endovascular therapy  Runell Gess, M.D., FACP, [facility], [facility] [address] [place], [place]  [address]  [phone] 06/17/2019 7:34 AM

## 2019-06-17 NOTE — Discharge Instructions (Signed)
Femoral Site Care This sheet gives you information about how to care for yourself after your procedure. Your health care provider may also give you more specific instructions. If you have problems or questions, contact your health care provider. What can I expect after the procedure? After the procedure, it is common to have:  Bruising that usually fades within 1-2 weeks.  Tenderness at the site. Follow these instructions at home: Wound care  Follow instructions from your health care provider about how to take care of your insertion site. Make sure you: ? Wash your hands with soap and water before you change your bandage (dressing). If soap and water are not available, use hand sanitizer. ? Change your dressing as told by your health care provider. ? Leave stitches (sutures), skin glue, or adhesive strips in place. These skin closures may need to stay in place for 2 weeks or longer. If adhesive strip edges start to loosen and curl up, you may trim the loose edges. Do not remove adhesive strips completely unless your health care provider tells you to do that.  Do not take baths, swim, or use a hot tub until your health care provider approves.  You may shower 24-48 hours after the procedure or as told by your health care provider. ? Gently wash the site with plain soap and water. ? Pat the area dry with a clean towel. ? Do not rub the site. This may cause bleeding.  Do not apply powder or lotion to the site. Keep the site clean and dry.  Check your femoral site every day for signs of infection. Check for: ? Redness, swelling, or pain. ? Fluid or blood. ? Warmth. ? Pus or a bad smell. Activity  For the first 2-3 days after your procedure, or as long as directed: ? Avoid climbing stairs as much as possible. ? Do not squat.  Do not lift anything that is heavier than 10 lb (4.5 kg), or the limit that you are told, until your health care provider says that it is safe.  Rest as  directed. ? Avoid sitting for a long time without moving. Get up to take short walks every 1-2 hours.  Do not drive for 24 hours if you were given a medicine to help you relax (sedative). General instructions  Take over-the-counter and prescription medicines only as told by your health care provider.  Keep all follow-up visits as told by your health care provider. This is important. Contact a health care provider if you have:  A fever or chills.  You have redness, swelling, or pain around your insertion site. Get help right away if:  The catheter insertion area swells very fast.  You pass out.  You suddenly start to sweat or your skin gets clammy.  The catheter insertion area is bleeding, and the bleeding does not stop when you hold steady pressure on the area.  The area near or just beyond the catheter insertion site becomes pale, cool, tingly, or numb. These symptoms may represent a serious problem that is an emergency. Do not wait to see if the symptoms will go away. Get medical help right away. Call your local emergency services (911 in the [place] Do not drive yourself to the hospital. Summary  After the procedure, it is common to have bruising that usually fades within 1-2 weeks.  Check your femoral site every day for signs of infection.  Do not lift anything that is heavier than 10 lb (4.5 kg), or the   limit that you are told, until your health care provider says that it is safe. This information is not intended to replace advice given to you by your health care provider. Make sure you discuss any questions you have with your health care provider. Document Revised: 02/20/2017 Document Reviewed: 02/20/2017 Elsevier Patient Education  2020 [employer]  

## 2019-06-21 ENCOUNTER — Telehealth: Payer: Self-pay | Admitting: Cardiovascular Disease

## 2019-06-21 ENCOUNTER — Other Ambulatory Visit: Payer: Self-pay

## 2019-06-21 ENCOUNTER — Ambulatory Visit (HOSPITAL_COMMUNITY)
Admission: RE | Admit: 2019-06-21 | Discharge: 2019-06-21 | Disposition: A | Discharge status: Home / Self Care | Payer: Medicare HMO | Source: Ambulatory Visit | Attending: Cardiovascular Disease | Admitting: Cardiovascular Disease

## 2019-06-21 DIAGNOSIS — M79661 Pain in right lower leg: Secondary | ICD-10-CM

## 2019-06-21 DIAGNOSIS — I739 Peripheral vascular disease, unspecified: Secondary | ICD-10-CM | POA: Insufficient documentation

## 2019-06-21 DIAGNOSIS — M7989 Other specified soft tissue disorders: Secondary | ICD-10-CM | POA: Insufficient documentation

## 2019-06-21 NOTE — Telephone Encounter (Signed)
Returned the call to the patient. He had a pv angiogram on 4/26 with stent placement. He stated that by Wednesday his right leg was swollen from around the stent placement area down to his toes. He stated that it is bad enough that he cannot see his knee. He does feel like it may be someone better today.  He denies any drainage from the site but did say he feels like his leg is warm to touch. He denies a fever. The place where the stent is is somewhat painful.   The patient has been notified that he has an appointment today and to arrive at 3:15 for dopplers. He has verbalized his understanding.

## 2019-06-21 NOTE — Telephone Encounter (Signed)
° °  Pt c/o swelling: STAT is pt has developed SOB within 24 hours  1) How much weight have you gained and in what time span? No  2) If swelling, where is the swelling located? Right leg from the stent site down to his foot  3) Are you currently taking a fluid pill? No  4) Are you currently SOB? No  5) Do you have a log of your daily weights (if so, list)? No  6) Have you gained 3 pounds in a day or 5 pounds in a week? No  7) Have you traveled recently? No  Pt said his leg where he got his stent inserted started to swell up down to his foot on wednesday, he said it started to go down this morning but he just wanted to let dr. Allyson Sabal know because this is the first time happened to him, he also said it hurst a little when he stands up  Please advise

## 2019-06-24 ENCOUNTER — Other Ambulatory Visit (HOSPITAL_COMMUNITY): Payer: Self-pay | Admitting: Cardiovascular Disease

## 2019-06-24 ENCOUNTER — Ambulatory Visit (HOSPITAL_COMMUNITY)
Admission: RE | Admit: 2019-06-24 | Discharge: 2019-06-24 | Disposition: A | Discharge status: Home / Self Care | Payer: Medicare HMO | Source: Ambulatory Visit | Attending: Cardiology | Admitting: Cardiology

## 2019-06-24 ENCOUNTER — Other Ambulatory Visit: Payer: Self-pay

## 2019-06-24 ENCOUNTER — Telehealth: Payer: Self-pay

## 2019-06-24 DIAGNOSIS — I739 Peripheral vascular disease, unspecified: Secondary | ICD-10-CM

## 2019-06-24 DIAGNOSIS — Z95820 Peripheral vascular angioplasty status with implants and grafts: Secondary | ICD-10-CM

## 2019-06-24 NOTE — Telephone Encounter (Signed)
Spoke to patient he wanted to ask Dr.Berry if he can return to work light duty.Dr.Berry advised ok.

## 2019-06-25 ENCOUNTER — Other Ambulatory Visit: Payer: Self-pay

## 2019-06-25 DIAGNOSIS — I739 Peripheral vascular disease, unspecified: Secondary | ICD-10-CM

## 2019-06-25 NOTE — Progress Notes (Signed)
vaqs 

## 2019-07-02 ENCOUNTER — Encounter: Payer: Self-pay | Admitting: Cardiovascular Disease

## 2019-07-02 ENCOUNTER — Other Ambulatory Visit: Payer: Self-pay

## 2019-07-02 ENCOUNTER — Ambulatory Visit (INDEPENDENT_AMBULATORY_CARE_PROVIDER_SITE_OTHER): Payer: Medicare HMO | Admitting: Cardiovascular Disease

## 2019-07-02 VITALS — BP 138/72 | HR 58 | Temp 98.4°F | Ht 68.5 in | Wt 167.6 lb

## 2019-07-02 DIAGNOSIS — I739 Peripheral vascular disease, unspecified: Secondary | ICD-10-CM | POA: Diagnosis not present

## 2019-07-02 NOTE — Assessment & Plan Note (Signed)
History of prior right SFA stent implantation by myself 09/18/2012.  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  I placed a 6 mm x 80 mm long Eluvia  drug-eluting stent.  I sent him home on the same day.  His claudication resolved.  His Dopplers showed normal normalization.

## 2019-07-02 NOTE — Patient Instructions (Signed)
Medication Instructions:  NO CHANGE  *If you need a refill on your cardiac medications before your next appointment, please call your pharmacy*   Lab Work: If you have labs (blood work) drawn today and your tests are completely normal, you will receive your results only by: [name] MyChart Message (if you have MyChart) OR . A paper copy in the mail If you have any lab test that is abnormal or we need to change your treatment, we will call you to review the results.   Testing/Procedures: Your physician has requested that you have a lower extremity arterial duplex. This test is an ultrasound of the arteries in the legs or arms. It looks at arterial blood flow in the legs and arms. Allow one hour for Lower and Upper Arterial scans. There are no restrictions or special instructions SCHEDULE IN 6 MONTHS   Follow-Up: At [facility], you and your health needs are our priority.  As part of our continuing mission to provide you with exceptional heart care, we have created designated Provider Care Teams.  These Care Teams include your primary Cardiologist (physician) and Advanced Practice Providers (APPs -  Physician Assistants and Nurse Practitioners) who all work together to provide you with the care you need, when you need it.  We recommend signing up for the patient portal called "MyChart".  Sign up information is provided on this After Visit Summary.  MyChart is used to connect with patients for Virtual Visits (Telemedicine).  Patients are able to view lab/test results, encounter notes, upcoming appointments, etc.  Non-urgent messages can be sent to your provider as well.   To learn more about what you can do with MyChart, go to [email or web address].    Your next appointment:   12 month(s)  The format for your next appointment:   Either In Person or Virtual  Provider:   You may see Nanetta Batty, MD or one of the following Advanced Practice Providers on your designated Care Team:    Corine Shelter, PA-C  [place], [place]  Edd Fabian, [place]

## 2019-07-02 NOTE — Progress Notes (Signed)
07/02/2019 George Cooper   09-27-1955  [id number]  Primary Physician George Sites, MD Primary Cardiologist: George Harp MD George Cooper, [place]  HPI:  George Cooper is a 64 y.o.  thin appearing married Caucasian male father of 2, grandfather of 2 grandchildren works as an Clinical biochemist. He was referred by Dr. Dillard Cooper , at [facility], in [place] for evaluation of dizziness and carotid artery disease. I last saw him in the office 05/15/2019.Marland KitchenHe denies chest pain or shortness of breath. He did complain however of right calf claudication. His cardiovascular risk profile is positive for a 20-pack-year history of tobacco abuse discontinued 10/14. He is not diabetic, hypertensive or hyperlipidemic. His mother did have bypass surgery in her 68s and his sister has had coronary stents as well.A stress test which was normal. Lower extremity arterial Dopplers revealed a high-frequency signal in the proximal segment of his right SFA. On 09/18/12 the patient underwent peripheral angiography, directional atherectomy, PTA and stenting of a high-grade segmental proximal right SFA stenosis. He had excellent angiographic and clinical result. His Doppler show a widely patent proximal right SFA and his claudication has resolved. His lower extremity arterial Doppler studies performed 05/14/13 revealed a widely patent stent with ABIs 1 bilaterally. He complains of mild right calf claudication with significant exertion. Since I saw him last he remained stable. He denies chest pain, shortness of breath . Lower extremity Dopplers performed 08/27/15 revealed a decline in his right ABI from one down to 0.91 with a new high-frequency signal in his distal right SFA. He underwent peripheral angiography by myself 11/09/15 feeling a patent mid right SFA stent with a 95% stenosis just beyond this. I performed one directional atherectomy followed by drug-eluting balloon angioplasty with excellent angiographic and  clinical result. His claudication has completely resolved as Dopplers performed on 11/11/15 have normalized. He was admitted to the hospital on 11/16/15 with a transient neurologic event with negative workup.Since I saw him in the office in October of last year he was remained stable. He denies chest pain, shortness of breath or claudication. He is on Repatha with an excellent clinical result. His most recent lipid profile performed12/31/2018revealed total cholesterol of 82, LDL 23 and HDL 43.He had follow-up lower extremity arterial Doppler studies performed 08/31/16 revealing a new high frequency signal in his mid right SFA although he denies significant claudication.  Doppler studies performed 04/19/2019 revealing a right ankle-brachial index of 0.88 with a new high-frequency signal in his right SFA stent suggesting in-stent restenosis.  He has developed lifestyle limiting claudication on the right side .  As result of this I performed peripheral angiography on him 06/17/2019 revealing 95% "in-stent restenosis" within the previously placed right SFA stent in the distal aspect.  There is three-vessel runoff.  I restented this with an Eluvia 6 mm x 80 mm long self-expanding drug-coated stent.  He was discharged home the same day.  His claudication has resolved and his Dopplers are normalized.  Current Meds  Medication Sig  . Alirocumab (PRALUENT) 150 MG/ML SOAJ Inject 150 mg into the skin every 14 (fourteen) days.  [name] amLODipine (NORVASC) 10 MG tablet Take 1 tablet (10 mg total) by mouth daily.  [name] aspirin EC 81 MG tablet Take 1 tablet (81 mg total) by mouth daily.  . clopidogrel (PLAVIX) 75 MG tablet TAKE 1 TABLET WITH BREAKFAST.  . naproxen sodium (ANAPROX) 220 MG tablet Take 220 mg by mouth daily as needed.  . pravastatin (PRAVACHOL)  10 MG tablet Take 10 mg by mouth daily.     No Known Allergies  Social History   Socioeconomic History  . Marital status: Married    Spouse name: Not on file    . Number of children: Not on file  . Years of education: Not on file  . Highest education level: Not on file  Occupational History  . Not on file  Tobacco Use  . Smoking status: Former Smoker    Packs/day: 0.20    Years: 40.00    Pack years: 8.00    Types: Cigarettes    Quit date: 11/29/2012    Years since quitting: 6.5  . Smokeless tobacco: Never Used  . Tobacco comment: trying to quit, smokes 2-3 daily  Substance and Sexual Activity  . Alcohol use: No    Alcohol/week: 0.0 standard drinks  . Drug use: Yes    Types: Marijuana    Comment: "every once in a while"  . Sexual activity: Not on file  Other Topics Concern  . Not on file  Social History Narrative  . Not on file   Social Determinants of Health   Financial Resource Strain:   . Difficulty of Paying Living Expenses:   Food Insecurity:   . Worried About Programme researcher, broadcasting/film/video in the Last Year:   . Barista in the Last Year:   Transportation Needs:   . Freight forwarder (Medical):   [name] Lack of Transportation (Non-Medical):   Physical Activity:   . Days of Exercise per Week:   . Minutes of Exercise per Session:   Stress:   . Feeling of Stress :   Social Connections:   . Frequency of Communication with Friends and Family:   . Frequency of Social Gatherings with Friends and Family:   . Attends Religious Services:   . Active Member of Clubs or Organizations:   . Attends Banker Meetings:   [name] Marital Status:   Intimate Partner Violence:   . Fear of Current or Ex-Partner:   . Emotionally Abused:   [name] Physically Abused:   . Sexually Abused:      Review of Systems: General: negative for chills, fever, night sweats or weight changes.  Cardiovascular: negative for chest pain, dyspnea on exertion, edema, orthopnea, palpitations, paroxysmal nocturnal dyspnea or shortness of breath Dermatological: negative for rash Respiratory: negative for cough or wheezing Urologic: negative for  hematuria Abdominal: negative for nausea, vomiting, diarrhea, bright red blood per rectum, melena, or hematemesis Neurologic: negative for visual changes, syncope, or dizziness All other systems reviewed and are otherwise negative except as noted above.    Blood pressure 138/72, pulse (!) 58, temperature 98.4 F (36.9 C), height 5' 8.5" (1.74 m), weight 167 lb 9.6 oz (76 kg), SpO2 97 %.  General appearance: alert and no distress Neck: no adenopathy, no carotid bruit, no JVD, supple, symmetrical, trachea midline and thyroid not enlarged, symmetric, no tenderness/mass/nodules Lungs: clear to auscultation bilaterally Heart: regular rate and rhythm, S1, S2 normal, no murmur, click, rub or gallop Extremities: extremities normal, atraumatic, no cyanosis or edema Pulses: 2+ and symmetric Skin: Skin color, texture, turgor normal. No rashes or lesions Neurologic: Alert and oriented X 3, normal strength and tone. Normal symmetric reflexes. Normal coordination and gait  EKG sinus bradycardia at 58 with incomplete right bundle branch block.  I personally reviewed this EKG.  ASSESSMENT AND PLAN:   Claudication [facility]) History of prior right SFA stent implantation by myself  09/18/2012.  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  I placed a 6 mm x 80 mm long Eluvia  drug-eluting stent.  I sent him home on the same day.  His claudication resolved.  His Dopplers showed normal normalization.      Runell Gess MD FACP,FACC,FAHA, [facility] 07/02/2019 9:45 AM

## 2019-09-24 ENCOUNTER — Ambulatory Visit (HOSPITAL_COMMUNITY)
Admission: RE | Admit: 2019-09-24 | Discharge: 2019-09-24 | Disposition: A | Discharge status: Home / Self Care | Payer: Medicare HMO | Source: Ambulatory Visit | Attending: Internal Medicine | Admitting: Internal Medicine

## 2019-09-24 ENCOUNTER — Other Ambulatory Visit: Payer: Self-pay

## 2019-09-24 ENCOUNTER — Other Ambulatory Visit (HOSPITAL_COMMUNITY): Payer: Self-pay | Admitting: Cardiovascular Disease

## 2019-09-24 DIAGNOSIS — I6523 Occlusion and stenosis of bilateral carotid arteries: Secondary | ICD-10-CM

## 2019-10-21 ENCOUNTER — Other Ambulatory Visit: Payer: Self-pay

## 2019-10-21 DIAGNOSIS — I6523 Occlusion and stenosis of bilateral carotid arteries: Secondary | ICD-10-CM

## 2019-10-22 DIAGNOSIS — Z20828 Contact with and (suspected) exposure to other viral communicable diseases: Secondary | ICD-10-CM | POA: Diagnosis not present

## 2020-01-02 ENCOUNTER — Ambulatory Visit (HOSPITAL_COMMUNITY)
Admission: RE | Admit: 2020-01-02 | Discharge: 2020-01-02 | Disposition: A | Discharge status: Home / Self Care | Payer: Medicare HMO | Source: Ambulatory Visit | Attending: Cardiology | Admitting: Cardiology

## 2020-01-02 ENCOUNTER — Other Ambulatory Visit: Payer: Self-pay | Admitting: Cardiovascular Disease

## 2020-01-02 ENCOUNTER — Other Ambulatory Visit (HOSPITAL_COMMUNITY): Payer: Self-pay | Admitting: Cardiovascular Disease

## 2020-01-02 ENCOUNTER — Other Ambulatory Visit: Payer: Self-pay

## 2020-01-02 DIAGNOSIS — Z95820 Peripheral vascular angioplasty status with implants and grafts: Secondary | ICD-10-CM | POA: Diagnosis not present

## 2020-01-02 DIAGNOSIS — I739 Peripheral vascular disease, unspecified: Secondary | ICD-10-CM

## 2020-01-06 DIAGNOSIS — M722 Plantar fascial fibromatosis: Secondary | ICD-10-CM | POA: Diagnosis not present

## 2020-01-06 DIAGNOSIS — Z6825 Body mass index (BMI) 25.0-25.9, adult: Secondary | ICD-10-CM | POA: Diagnosis not present

## 2020-01-06 DIAGNOSIS — E663 Overweight: Secondary | ICD-10-CM | POA: Diagnosis not present

## 2020-01-06 DIAGNOSIS — M7731 Calcaneal spur, right foot: Secondary | ICD-10-CM | POA: Diagnosis not present

## 2020-01-08 ENCOUNTER — Other Ambulatory Visit: Payer: Self-pay

## 2020-01-08 ENCOUNTER — Ambulatory Visit (INDEPENDENT_AMBULATORY_CARE_PROVIDER_SITE_OTHER): Payer: Medicare HMO | Admitting: Cardiovascular Disease

## 2020-01-08 ENCOUNTER — Encounter: Payer: Self-pay | Admitting: Cardiovascular Disease

## 2020-01-08 DIAGNOSIS — E782 Mixed hyperlipidemia: Secondary | ICD-10-CM | POA: Diagnosis not present

## 2020-01-08 DIAGNOSIS — I6523 Occlusion and stenosis of bilateral carotid arteries: Secondary | ICD-10-CM

## 2020-01-08 DIAGNOSIS — I739 Peripheral vascular disease, unspecified: Secondary | ICD-10-CM

## 2020-01-08 NOTE — Patient Instructions (Signed)
Medication Instructions:  Your physician recommends that you continue on your current medications as directed. Please refer to the Current Medication list given to you today.  *If you need a refill on your cardiac medications before your next appointment, please call your pharmacy*  Testing/Procedures: Your physician has requested that you have an ankle brachial index (ABI). During this test an ultrasound and blood pressure cuff are used to evaluate the arteries that supply the arms and legs with blood. Allow thirty minutes for this exam. There are no restrictions or special instructions. To be done in 6 months.  Your physician has requested that you have a lower extremity arterial duplex. This test is an ultrasound of the arteries in the legs. It looks at arterial blood flow in the legs and arms. Allow one hour for Lower Arterial scans. There are no restrictions or special instructions. To be done in 6 months.  Your physician has requested that you have a carotid duplex. This test is an ultrasound of the carotid arteries in your neck. It looks at blood flow through these arteries that supply the brain with blood. Allow one hour for this exam. There are no restrictions or special instructions. To be done in Aug of 2022.   Follow-Up: At [facility], you and your health needs are our priority.  As part of our continuing mission to provide you with exceptional heart care, we have created designated Provider Care Teams.  These Care Teams include your primary Cardiologist (physician) and Advanced Practice Providers (APPs -  Physician Assistants and Nurse Practitioners) who all work together to provide you with the care you need, when you need it.  We recommend signing up for the patient portal called "MyChart".  Sign up information is provided on this After Visit Summary.  MyChart is used to connect with patients for Virtual Visits (Telemedicine).  Patients are able to view lab/test results, encounter  notes, upcoming appointments, etc.  Non-urgent messages can be sent to your provider as well.   To learn more about what you can do with MyChart, go to [email or web address].    Your next appointment:   6 month(s)  The format for your next appointment:   In Person  Provider:   Nanetta Batty, MD   Other Instructions Follow to be made after LEA duplex.

## 2020-01-08 NOTE — Progress Notes (Signed)
01/08/2020 WILMORE HOLSOMBACK   [birth year]  [id number]  Primary Physician Assunta Found, MD Primary Cardiologist: Runell Gess MD Nicholes Calamity, [place]  HPI:  George Cooper is a 64 y.o.  thin appearing married Caucasian male father of 2, grandfather of 2 grandchildren works as an Personnel officer. He was referred by Dr. Yetta Numbers , at [facility], in [place] for evaluation of dizziness and carotid artery disease. I last saw him in the office  07/02/2019.George KitchenHe denies chest pain or shortness of breath. He did complain however of right calf claudication. His cardiovascular risk profile is positive for a 20-pack-year history of tobacco abuse discontinued 10/14. He is not diabetic, hypertensive or hyperlipidemic. His mother did have bypass surgery in her 72s and his sister has had coronary stents as well.A stress test which was normal. Lower extremity arterial Dopplers revealed a high-frequency signal in the proximal segment of his right SFA. On 09/18/12 the patient underwent peripheral angiography, directional atherectomy, PTA and stenting of a high-grade segmental proximal right SFA stenosis. He had excellent angiographic and clinical result. His Doppler show a widely patent proximal right SFA and his claudication has resolved. His lower extremity arterial Doppler studies performed 05/14/13 revealed a widely patent stent with ABIs 1 bilaterally. He complains of mild right calf claudication with significant exertion. Since I saw him last he remained stable. He denies chest pain, shortness of breath . Lower extremity Dopplers performed 08/27/15 revealed a decline in his right ABI from one down to 0.91 with a new high-frequency signal in his distal right SFA. He underwent peripheral angiography by myself 11/09/15 feeling a patent mid right SFA stent with a 95% stenosis just beyond this. I performed one directional atherectomy followed by drug-eluting balloon angioplasty with excellent angiographic and  clinical result. His claudication has completely resolved as Dopplers performed on 11/11/15 have normalized. He was admitted to the hospital on 11/16/15 with a transient neurologic event with negative workup.Since I saw him in the office in October of last year he was remained stable. He denies chest pain, shortness of breath or claudication. He is on Repatha with an excellent clinical result. His most recent lipid profile performed12/31/2018revealed total cholesterol of 82, LDL 23 and HDL 43.He had follow-up lower extremity arterial Doppler studies performed 08/31/16 revealing a new high frequency signal in his mid right SFA although he denies significant claudication.  Doppler studies performed 04/19/2019 revealing a right ankle-brachial index of 0.88 with a new high-frequency signal in his right SFA stent suggesting in-stent restenosis. He has developed lifestyle limiting claudication on the right side .  As result of this I performed peripheral angiography on him 06/17/2019 revealing 95% "in-stent restenosis" within the previously placed right SFA stent in the distal aspect.  There is three-vessel runoff.  I restented this with an Eluvia 6 mm x 80 mm long self-expanding drug-coated stent.  He was discharged home the same day.  His claudication has resolved and his Dopplers are normalized.  Since I saw him 6 months ago he continues to do well.  He still denies claudication on the recent Dopplers performed 01/02/2020 showed a high-frequency signal in his mid right SFA.  He does not smoke and his lipid profile is excellent.  We will continue to monitor this on a semiannual basis.   Current Meds  Medication Sig  . Alirocumab (PRALUENT) 150 MG/ML SOAJ Inject 150 mg into the skin every 14 (fourteen) days.  George Cooper amLODipine (NORVASC) 10 MG tablet Take  1 tablet (10 mg total) by mouth daily.  George Cooper aspirin EC 81 MG tablet Take 1 tablet (81 mg total) by mouth daily.  . clopidogrel (PLAVIX) 75 MG tablet TAKE 1  TABLET WITH BREAKFAST.  . naproxen sodium (ANAPROX) 220 MG tablet Take 220 mg by mouth daily as needed.  . pravastatin (PRAVACHOL) 10 MG tablet Take 10 mg by mouth daily.     No Known Allergies  Social History   Socioeconomic History  . Marital status: Married    Spouse name: Not on file  . Number of children: Not on file  . Years of education: Not on file  . Highest education level: Not on file  Occupational History  . Not on file  Tobacco Use  . Smoking status: Former Smoker    Packs/day: 0.20    Years: 40.00    Pack years: 8.00    Types: Cigarettes    Quit date: 11/29/2012    Years since quitting: 7.1  . Smokeless tobacco: Never Used  . Tobacco comment: trying to quit, smokes 2-3 daily  Substance and Sexual Activity  . Alcohol use: No    Alcohol/week: 0.0 standard drinks  . Drug use: Yes    Types: Marijuana    Comment: "every once in a while"  . Sexual activity: Not on file  Other Topics Concern  . Not on file  Social History Narrative  . Not on file   Social Determinants of Health   Financial Resource Strain:   . Difficulty of Paying Living Expenses: Not on file  Food Insecurity:   . Worried About Programme researcher, broadcasting/film/video in the Last Year: Not on file  . Ran Out of Food in the Last Year: Not on file  Transportation Needs:   . Lack of Transportation (Medical): Not on file  . Lack of Transportation (Non-Medical): Not on file  Physical Activity:   . Days of Exercise per Week: Not on file  . Minutes of Exercise per Session: Not on file  Stress:   . Feeling of Stress : Not on file  Social Connections:   . Frequency of Communication with Friends and Family: Not on file  . Frequency of Social Gatherings with Friends and Family: Not on file  . Attends Religious Services: Not on file  . Active Member of Clubs or Organizations: Not on file  . Attends Banker Meetings: Not on file  . Marital Status: Not on file  Intimate Partner Violence:   . Fear of  Current or Ex-Partner: Not on file  . Emotionally Abused: Not on file  . Physically Abused: Not on file  . Sexually Abused: Not on file     Review of Systems: General: negative for chills, fever, night sweats or weight changes.  Cardiovascular: negative for chest pain, dyspnea on exertion, edema, orthopnea, palpitations, paroxysmal nocturnal dyspnea or shortness of breath Dermatological: negative for rash Respiratory: negative for cough or wheezing Urologic: negative for hematuria Abdominal: negative for nausea, vomiting, diarrhea, bright red blood per rectum, melena, or hematemesis Neurologic: negative for visual changes, syncope, or dizziness All other systems reviewed and are otherwise negative except as noted above.    Blood pressure 124/68, pulse 67, height 5' 8.5" (1.74 m), weight 167 lb (75.8 kg).  General appearance: alert and no distress Neck: no adenopathy, no carotid bruit, no JVD, supple, symmetrical, trachea midline and thyroid not enlarged, symmetric, no tenderness/mass/nodules Lungs: clear to auscultation bilaterally Heart: regular rate and rhythm, S1, S2 normal,  no murmur, click, rub or gallop Extremities: extremities normal, atraumatic, no cyanosis or edema Pulses: 2+ and symmetric Skin: Skin color, texture, turgor normal. No rashes or lesions Neurologic: Alert and oriented X 3, normal strength and tone. Normal symmetric reflexes. Normal coordination and gait  EKG sinus rhythm at 67 with incomplete right bundle branch block and inferior Q waves extending to lateral leads.  I personally reviewed this EKG.  ASSESSMENT AND PLAN:   PAD (peripheral artery disease), s/p athrectomy, PTA/ stent Rt. SFA  09/18/2012 History of peripheral arterial disease status post directional atherectomy, PTA and stenting of a high-grade segmental proximal right SFA stenosis by myself 09/18/2012.  In 2017 he had a new high-frequency signal in his distal right SFA.  I reangiogram him 11/09/2015  revealing patent mid right SFA stent with a 95% stenosis just beyond this.  I performed directional atherectomy followed by drug coated balloon angioplasty with excellent result and his claudication resolved with normalization of his Dopplers at that time on 11/11/2015.  Because of recurrent claudication, and Dopplers on 04/19/2019 revealing a decrease in his right ABI with a new high-frequency signal Prestyn Stanco angiogram 10 once again on 06/17/2019 revealing a 95% "in-stent restenosis with the previously placed right SFA stent the distal aspect with three-vessel runoff.  I restented this with an Eluvia  6 mm x 80 mm long self-expanding drug-coated stent he was discharged home the same day.  His claudication resolved and his Dopplers normalized.  Recent Dopplers performed 01/02/2020 revealed a right ABI of 0.97 with a high-frequency signal in his mid right SFA with 399 cm/s.  He still denies claudication.  We will continue to follow this semiannually and reintervene if this continues to progress and/or he develops recurrent claudication.  Carotid artery disease History of moderate right ICA stenosis by duplex ultrasound performed 09/24/2019.  We will continue to follow this on annual basis.  Hyperlipidemia History of hyperlipidemia on Praluent and Pravachol with lipid profile performed 11/29/2018 revealing total cholesterol 84, LDL of 22 and HDL of 40.      Runell Gess MD FACP,FACC,FAHA, [facility] 01/08/2020 9:40 AM

## 2020-01-08 NOTE — Assessment & Plan Note (Signed)
History of hyperlipidemia on Praluent and Pravachol with lipid profile performed 11/29/2018 revealing total cholesterol 84, LDL of 22 and HDL of 40.

## 2020-01-08 NOTE — Assessment & Plan Note (Signed)
History of moderate right ICA stenosis by duplex ultrasound performed 09/24/2019.  We will continue to follow this on annual basis.

## 2020-01-08 NOTE — Assessment & Plan Note (Signed)
History of peripheral arterial disease status post directional atherectomy, PTA and stenting of a high-grade segmental proximal right SFA stenosis by myself 09/18/2012.  In 2017 he had a new high-frequency signal in his distal right SFA.  I reangiogram him 11/09/2015 revealing patent mid right SFA stent with a 95% stenosis just beyond this.  I performed directional atherectomy followed by drug coated balloon angioplasty with excellent result and his claudication resolved with normalization of his Dopplers at that time on 11/11/2015.  Because of recurrent claudication, and Dopplers on 04/19/2019 revealing a decrease in his right ABI with a new high-frequency signal Rylin Seavey angiogram 10 once again on 06/17/2019 revealing a 95% "in-stent restenosis with the previously placed right SFA stent the distal aspect with three-vessel runoff.  I restented this with an Eluvia  6 mm x 80 mm long self-expanding drug-coated stent he was discharged home the same day.  His claudication resolved and his Dopplers normalized.  Recent Dopplers performed 01/02/2020 revealed a right ABI of 0.97 with a high-frequency signal in his mid right SFA with 399 cm/s.  He still denies claudication.  We will continue to follow this semiannually and reintervene if this continues to progress and/or he develops recurrent claudication.

## 2020-02-10 ENCOUNTER — Other Ambulatory Visit: Payer: Self-pay | Admitting: Cardiovascular Disease

## 2020-02-27 DIAGNOSIS — Z6825 Body mass index (BMI) 25.0-25.9, adult: Secondary | ICD-10-CM | POA: Diagnosis not present

## 2020-02-27 DIAGNOSIS — G64 Other disorders of peripheral nervous system: Secondary | ICD-10-CM | POA: Diagnosis not present

## 2020-02-27 DIAGNOSIS — E663 Overweight: Secondary | ICD-10-CM | POA: Diagnosis not present

## 2020-04-06 ENCOUNTER — Other Ambulatory Visit: Payer: Self-pay | Admitting: Cardiovascular Disease

## 2020-05-13 ENCOUNTER — Other Ambulatory Visit: Payer: Self-pay | Admitting: Cardiovascular Disease

## 2020-06-01 DIAGNOSIS — H52212 Irregular astigmatism, left eye: Secondary | ICD-10-CM | POA: Diagnosis not present

## 2020-06-01 DIAGNOSIS — H11001 Unspecified pterygium of right eye: Secondary | ICD-10-CM | POA: Diagnosis not present

## 2020-06-01 DIAGNOSIS — H2513 Age-related nuclear cataract, bilateral: Secondary | ICD-10-CM | POA: Diagnosis not present

## 2020-06-01 DIAGNOSIS — H524 Presbyopia: Secondary | ICD-10-CM | POA: Diagnosis not present

## 2020-06-04 ENCOUNTER — Telehealth: Payer: Self-pay | Admitting: *Deleted

## 2020-06-04 NOTE — Telephone Encounter (Signed)
   [facility] Pre-operative Risk Assessment     Request for surgical clearance:  1. What type of surgery is being performed? Cataract surgery and pterygium excision (5/22-pterygium excision) (6/22-cataract OD) (7/27-cataract surgery OS)   2. When is this surgery scheduled? 5/22, 6/22, 7/27   3. What type of clearance is required (medical clearance vs. Pharmacy clearance to hold med vs. Both)? medical  4. Are there any medications that need to be held prior to surgery and how long?  N/A  5. Practice name and name of physician performing surgery? [facility]    6. What is the office phone number? [phone]   7.   What is the office fax number? [phone]  8.   Anesthesia type (None, local, MAC, general) ? Topical surgery with mild sedation   Jervon Ream A Jaidah Lomax 06/04/2020, 4:03 PM  ________________________________________________________________

## 2020-06-05 NOTE — Telephone Encounter (Signed)
Called LVM to call back

## 2020-06-05 NOTE — Telephone Encounter (Signed)
   Name: George Cooper  DOB: [birth year]  MRN: [id number]   Primary Cardiologist: Nanetta Batty, MD  Chart reviewed as part of pre-operative protocol coverage. Patient was contacted 06/05/2020 in reference to pre-operative risk assessment for pending surgery as outlined below.  CAMDAN BURDI was last seen on 01/08/20 by Dr. Allyson Sabal.  Since that day, STACEY MAURA has done relatively well from a cardiac standpoint. He reports stable DOE and minimal right lef claudication, only with severe exertion. Otherwise no chest pain, palpitations, LLE. METS>4.  Therefore, based on ACC/AHA guidelines, the patient would be at acceptable risk for the planned procedure without further cardiovascular testing.   The patient was advised that if he develops new symptoms prior to surgery to contact our office to arrange for a follow-up visit, and he verbalized understanding.  I will route this recommendation to the requesting party via Epic fax function and remove from pre-op pool. Please call with questions.  Kindel Rochefort David Stall, PA-C 06/05/2020, 10:00 AM

## 2020-06-05 NOTE — Telephone Encounter (Signed)
Patient is returning call.  °

## 2020-06-18 ENCOUNTER — Telehealth: Payer: Self-pay | Admitting: *Deleted

## 2020-06-18 NOTE — Telephone Encounter (Signed)
A message was left, re: his follow up visit. 

## 2020-06-30 DIAGNOSIS — Z0001 Encounter for general adult medical examination with abnormal findings: Secondary | ICD-10-CM | POA: Diagnosis not present

## 2020-06-30 DIAGNOSIS — E663 Overweight: Secondary | ICD-10-CM | POA: Diagnosis not present

## 2020-06-30 DIAGNOSIS — Z6825 Body mass index (BMI) 25.0-25.9, adult: Secondary | ICD-10-CM | POA: Diagnosis not present

## 2020-07-06 ENCOUNTER — Other Ambulatory Visit: Payer: Self-pay | Admitting: Cardiovascular Disease

## 2020-07-08 ENCOUNTER — Other Ambulatory Visit: Payer: Self-pay

## 2020-07-08 ENCOUNTER — Ambulatory Visit (HOSPITAL_COMMUNITY)
Admission: RE | Admit: 2020-07-08 | Discharge: 2020-07-08 | Disposition: A | Discharge status: Home / Self Care | Payer: Medicare HMO | Source: Ambulatory Visit | Attending: Cardiovascular Disease | Admitting: Cardiovascular Disease

## 2020-07-08 ENCOUNTER — Other Ambulatory Visit: Payer: Self-pay | Admitting: Cardiovascular Disease

## 2020-07-08 DIAGNOSIS — I739 Peripheral vascular disease, unspecified: Secondary | ICD-10-CM

## 2020-07-09 DIAGNOSIS — Z1211 Encounter for screening for malignant neoplasm of colon: Secondary | ICD-10-CM | POA: Diagnosis not present

## 2020-07-15 ENCOUNTER — Telehealth: Payer: Self-pay | Admitting: *Deleted

## 2020-07-15 NOTE — Telephone Encounter (Signed)
Low risk procedure but request is for both surgical clearance and to hold antiplatelets.  So, pt called to review clinical status.    Left message on VM to call back. Tereso Newcomer, PA-C    07/15/2020 2:07 PM

## 2020-07-15 NOTE — Telephone Encounter (Signed)
   [facility] Pre-operative Risk Assessment    Patient Name: George Cooper  DOB: [birth year]  MRN: [id number]     Request for surgical clearance:  1. What type of surgery is being performed? PTERYGIUM EXCISION 07/29/20, CATARACT SX 08/12/20, CATARACT SX 09/16/20  2. When is this surgery scheduled?  PTERYGIUM EXCISION 07/29/20, CATARACT SX 08/12/20, CATARACT SX 09/16/20 3.   4. What type of clearance is required (medical clearance vs. Pharmacy clearance to hold med vs. Both)? Medical  5. Are there any medications that need to be held prior to surgery and how long? ASPIRIN 81 MG ,  CLOPIDOGREL 75 MG   6. Practice name and name of physician performing surgery? [facility] ;DR BRADLEY BOWEN   7. What is the office phone number? [phone]   7.   What is the office fax number? [phone]  8.   Anesthesia type (None, local, MAC, general) ? LOCAL    George Cooper 07/15/2020, 12:10 PM  _________________________________________________________________   (provider comments below)

## 2020-07-15 NOTE — Telephone Encounter (Signed)
[facility] office, had to leave a detailed voicemail on the Nurse Tech's line.  Asked them to call back to confirm that they are asking pt to hold Aspirin & Plavix for Cataract Surgery. Asked them to call back and ask for preop call back.

## 2020-07-15 NOTE — Telephone Encounter (Signed)
Follow up:     Healther from [facility] would like to speak with some one concering this patient.

## 2020-07-15 NOTE — Telephone Encounter (Signed)
It is usually not necessary to hold anticoagulation or antiplatelet Rx for cataract surgery. However, surgeon specifically asking to hold ASA and Plavix.  Last PV intervention was > 1 year ago (4/21).  Will confirm with surgeon.  Will check with Dr. Allyson Sabal to see if ok to hold.  Dr. Allyson Sabal - Is it ok to hold ASA and Plavix for this patient's upcoming eye procedure?  Please route to P CV DIV PREOP  Tereso Newcomer, PA-C    07/15/2020 12:47 PM

## 2020-07-15 NOTE — Telephone Encounter (Signed)
Okay to hold antiplatelet agents for her upcoming eye procedure.

## 2020-07-16 NOTE — Telephone Encounter (Signed)
Spoke with George Cooper at [facility] and provided update from Dr. Allyson Sabal that the patient may hold antiplatelets prior to his procedure. Given Medical clearance is also requested, we are still trying to get in touch with the patient to assess how he is doing. George Cooper was provided with this update.

## 2020-07-16 NOTE — Telephone Encounter (Signed)
   Patient Name: George Cooper  DOB: [birth year]  MRN: [id number]   Primary Cardiologist: Nanetta Batty, MD  Chart reviewed as part of pre-operative protocol coverage. Recommendation from Dr. Allyson Sabal reviewed, ok to hold antiplatelet medications. Since request is for medical evaluation as well, I have left a message on the patient's VM to call back and ask for the pre-op team to discuss.    Eula Listen, PA-C 07/16/2020, 12:40 PM

## 2020-07-16 NOTE — Telephone Encounter (Signed)
See notes from pre op provider. Waiting for pt to call back for pre op assessment. Pt will need to s/w pre op provider when he calls back.

## 2020-07-16 NOTE — Telephone Encounter (Signed)
Returned call to [employer].  Left a message for her to call back and ask for the preop team.

## 2020-07-21 NOTE — Telephone Encounter (Signed)
   Patient Name: George Cooper  DOB: 14-Feb-1956  MRN: [id number]   Primary Cardiologist: Nanetta Batty, MD  Chart reviewed as part of pre-operative protocol coverage. Cataract extractions are recognized in guidelines as low risk surgeries that do not typically require specific preoperative testing or holding of blood thinner therapy. Therefore, given past medical history and time since last visit, based on ACC/AHA guidelines, [name] would be at acceptable risk for the planned procedure without further cardiovascular testing.   If needed, Dr. Allyson Sabal has given his blessing to hold aspirin and plavix 5-7 days prior to his upcoming cataract surgery with plans to restart when cleared to do so by his surgeon.   I will route this recommendation to the requesting party via Epic fax function and remove from pre-op pool.  Please call with questions.  Beatriz Stallion, PA-C 07/21/2020, 11:26 AM

## 2020-07-28 ENCOUNTER — Other Ambulatory Visit: Payer: Self-pay

## 2020-07-28 ENCOUNTER — Ambulatory Visit (INDEPENDENT_AMBULATORY_CARE_PROVIDER_SITE_OTHER): Payer: Medicare HMO | Admitting: Cardiovascular Disease

## 2020-07-28 ENCOUNTER — Encounter: Payer: Self-pay | Admitting: Cardiovascular Disease

## 2020-07-28 DIAGNOSIS — I739 Peripheral vascular disease, unspecified: Secondary | ICD-10-CM

## 2020-07-28 DIAGNOSIS — I10 Essential (primary) hypertension: Secondary | ICD-10-CM

## 2020-07-28 DIAGNOSIS — I6523 Occlusion and stenosis of bilateral carotid arteries: Secondary | ICD-10-CM | POA: Diagnosis not present

## 2020-07-28 DIAGNOSIS — E782 Mixed hyperlipidemia: Secondary | ICD-10-CM

## 2020-07-28 LAB — LIPID PANEL
Chol/HDL Ratio: 4.5 ratio (ref 0.0–5.0)
Cholesterol, Total: 178 mg/dL (ref 100–199)
HDL: 40 mg/dL (ref >39)
LDL Chol Calc (NIH): 113 mg/dL — ABNORMAL HIGH (ref 0–99)
Triglycerides: 137 mg/dL (ref 0–149)
VLDL Cholesterol Cal: 25 mg/dL (ref 5–40)

## 2020-07-28 LAB — HEPATIC FUNCTION PANEL
ALT: 15 IU/L (ref 0–44)
AST: 21 IU/L (ref 0–40)
Albumin: 4.7 g/dL (ref 3.8–4.8)
Alkaline Phosphatase: 72 IU/L (ref 44–121)
Bilirubin Total: 0.4 mg/dL (ref 0.0–1.2)
Bilirubin, Direct: <0.1 mg/dL (ref 0.00–0.40)
Total Protein: 7 g/dL (ref 6.0–8.5)

## 2020-07-28 NOTE — Assessment & Plan Note (Signed)
History of peripheral arterial disease status post intervention on his right SFA 09/18/2012.  He had a new lesion in 2017 George Cooper angiogram 10 11/09/2015 revealing a 95% stenosis just beyond the previously placed stent.  Performed directional atherectomy followed by drug-coated balloon angioplasty with excellent result.  Because of recurrent disease I Rie angiogram him 06/17/2019 revealing a 95% "in-stent restenosis within the previously placed right SFA stent and restented him with a 6 mm x 80 mm long Eluvia drug-eluting stent.  He had three-vessel runoff bilaterally.  His claudication and ABIs improved at that time.  He has had no further claudication although his most recent Dopplers performed 07/08/2020 revealed slight decline in his right ABI from 0.97-0.91.  He does have a high-frequency signal 555 cm/s in his mid right SFA suggesting "in-stent restenosis".

## 2020-07-28 NOTE — Progress Notes (Signed)
07/28/2020 George Cooper   12/20/1955  [id number]  Primary Physician Assunta Found, MD Primary Cardiologist: Runell Gess MD George Cooper, [place]  HPI:  George Cooper is a 65 y.o.   thin appearing married Caucasian male father of 2, grandfather of 2 grandchildren works as an Personnel officer. He was referred by Dr. Yetta Numbers , at [facility], in [place] for evaluation of dizziness and carotid artery disease. I last saw him in the office 01/08/2020.Marland KitchenHe denies chest pain or shortness of breath. He did complain however of right calf claudication. His cardiovascular risk profile is positive for a 20-pack-year history of tobacco abuse discontinued 10/14. He is not diabetic, hypertensive or hyperlipidemic. His mother did have bypass surgery in her 17s and his sister has had coronary stents as well.A stress test which was normal. Lower extremity arterial Dopplers revealed a high-frequency signal in the proximal segment of his right SFA. On 09/18/12 the patient underwent peripheral angiography, directional atherectomy, PTA and stenting of a high-grade segmental proximal right SFA stenosis. He had excellent angiographic and clinical result. His Doppler show a widely patent proximal right SFA and his claudication has resolved. His lower extremity arterial Doppler studies performed 05/14/13 revealed a widely patent stent with ABIs 1 bilaterally. He complains of mild right calf claudication with significant exertion. Since I saw him last he remained stable. He denies chest pain, shortness of breath . Lower extremity Dopplers performed 08/27/15 revealed a decline in his right ABI from one down to 0.91 with a new high-frequency signal in his distal right SFA. He underwent peripheral angiography by myself 11/09/15 feeling a patent mid right SFA stent with a 95% stenosis just beyond this. I performed one directional atherectomy followed by drug-eluting balloon angioplasty with excellent angiographic and  clinical result. His claudication has completely resolved as Dopplers performed on 11/11/15 have normalized. He was admitted to the hospital on 11/16/15 with a transient neurologic event with negative workup.Since I saw him in the office in October of last year he was remained stable. He denies chest pain, shortness of breath or claudication. He is on Repatha with an excellent clinical result. His most recent lipid profile performed12/31/2018revealed total cholesterol of 82, LDL 23 and HDL 43.He had follow-up lower extremity arterial Doppler studies performed 08/31/16 revealing a new high frequency signal in his mid right SFA although he denies significant claudication.  Doppler studies performed 04/19/2019 revealing a right ankle-brachial index of 0.88 with a new high-frequency signal in his right SFA stent suggesting in-stent restenosis. He has developed lifestyle limiting claudication on the right side.  As result of this I performed peripheral angiography on him 06/17/2019 revealing 95% "in-stent restenosis" within the previously placed right SFA stent in the distal aspect. There is three-vessel runoff. I restented this with an Eluvia6 mm x 80 mm long self-expanding drug-coated stent. He was discharged home the same day. His claudication has resolved and his Dopplers are normalized.  Since I saw him 6 months ago he continues to do well.  He still denies claudication on the recent Dopplers performed 07/08/2020 showed a high-frequency signal in his mid right SFA.  He does not smoke and his lipid profile is excellent.  We will continue to monitor this on a semiannual basis.   Current Meds  Medication Sig  . Alirocumab (PRALUENT) 150 MG/ML SOAJ Inject 150 mg into the skin every 14 (fourteen) days.  [name] amLODipine (NORVASC) 10 MG tablet TAKE (1) TABLET BY MOUTH ONCE DAILY.  [name]  aspirin EC 81 MG tablet Take 1 tablet (81 mg total) by mouth daily.  . clopidogrel (PLAVIX) 75 MG tablet TAKE 1 TABLET  WITH BREAKFAST.  . naproxen sodium (ANAPROX) 220 MG tablet Take 220 mg by mouth daily as needed.  . pravastatin (PRAVACHOL) 10 MG tablet Take 10 mg by mouth daily.     No Known Allergies  Social History   Socioeconomic History  . Marital status: Married    Spouse name: Not on file  . Number of children: Not on file  . Years of education: Not on file  . Highest education level: Not on file  Occupational History  . Not on file  Tobacco Use  . Smoking status: Former Smoker    Packs/day: 0.20    Years: 40.00    Pack years: 8.00    Types: Cigarettes    Quit date: 11/29/2012    Years since quitting: 7.6  . Smokeless tobacco: Never Used  . Tobacco comment: trying to quit, smokes 2-3 daily  Substance and Sexual Activity  . Alcohol use: No    Alcohol/week: 0.0 standard drinks  . Drug use: Yes    Types: Marijuana    Comment: "every once in a while"  . Sexual activity: Not on file  Other Topics Concern  . Not on file  Social History Narrative  . Not on file   Social Determinants of Health   Financial Resource Strain: Not on file  Food Insecurity: Not on file  Transportation Needs: Not on file  Physical Activity: Not on file  Stress: Not on file  Social Connections: Not on file  Intimate Partner Violence: Not on file     Review of Systems: General: negative for chills, fever, night sweats or weight changes.  Cardiovascular: negative for chest pain, dyspnea on exertion, edema, orthopnea, palpitations, paroxysmal nocturnal dyspnea or shortness of breath Dermatological: negative for rash Respiratory: negative for cough or wheezing Urologic: negative for hematuria Abdominal: negative for nausea, vomiting, diarrhea, bright red blood per rectum, melena, or hematemesis Neurologic: negative for visual changes, syncope, or dizziness All other systems reviewed and are otherwise negative except as noted above.    Blood pressure (!) 150/66, pulse 62, height 5\' 8"  (1.727 m),  weight 165 lb (74.8 kg), SpO2 97 %.  General appearance: alert and no distress Neck: no adenopathy, no carotid bruit, no JVD, supple, symmetrical, trachea midline and thyroid not enlarged, symmetric, no tenderness/mass/nodules Lungs: clear to auscultation bilaterally Heart: regular rate and rhythm, S1, S2 normal, no murmur, click, rub or gallop Extremities: extremities normal, atraumatic, no cyanosis or edema Pulses: 2+ and symmetric Skin: Skin color, texture, turgor normal. No rashes or lesions Neurologic: Alert and oriented X 3, normal strength and tone. Normal symmetric reflexes. Normal coordination and gait  EKG sinus rhythm at 62 with right bundle branch block and anterolateral Q waves.  I personally reviewed this EKG.  ASSESSMENT AND PLAN:   Claudication [facility]) History of peripheral arterial disease status post intervention on his right SFA 09/18/2012.  He had a new lesion in 2017 Olisa Quesnel angiogram 10 11/09/2015 revealing a 95% stenosis just beyond the previously placed stent.  Performed directional atherectomy followed by drug-coated balloon angioplasty with excellent result.  Because of recurrent disease I Rie angiogram him 06/17/2019 revealing a 95% "in-stent restenosis within the previously placed right SFA stent and restented him with a 6 mm x 80 mm long Eluvia drug-eluting stent.  He had three-vessel runoff bilaterally.  His claudication and ABIs improved  at that time.  He has had no further claudication although his most recent Dopplers performed 07/08/2020 revealed slight decline in his right ABI from 0.97-0.91.  He does have a high-frequency signal 555 cm/s in his mid right SFA suggesting "in-stent restenosis".  Carotid artery disease Moderate right ICA stenosis by duplex ultrasound performed 09/24/2019.  This will be repeated.  Hyperlipidemia History of hyperlipidemia on statin therapy and Praluent with lipid profile performed 11/29/2018 revealing total cholesterol 84, LDL 22 and HDL 48.   We will repeat a lipid and liver profile.  Essential hypertension History of essential hypertension a blood pressure measured today at 150/66.  He is on amlodipine.      Runell Gess MD FACP,FACC,FAHA, [facility] 07/28/2020 11:32 AM

## 2020-07-28 NOTE — Assessment & Plan Note (Signed)
History of essential hypertension a blood pressure measured today at 150/66.  He is on amlodipine.

## 2020-07-28 NOTE — Assessment & Plan Note (Signed)
Moderate right ICA stenosis by duplex ultrasound performed 09/24/2019.  This will be repeated.

## 2020-07-28 NOTE — Assessment & Plan Note (Addendum)
History of hyperlipidemia on statin therapy and Praluent with lipid profile performed 11/29/2018 revealing total cholesterol 84, LDL 22 and HDL 48.  We will repeat a lipid and liver profile.

## 2020-07-28 NOTE — Patient Instructions (Addendum)
Medication Instructions:  Your physician recommends that you continue on your current medications as directed. Please refer to the Current Medication list given to you today.  *If you need a refill on your cardiac medications before your next appointment, please call your pharmacy*  Testing/Procedures: Your physician has requested that you have a lower extremity arterial duplex. This test is an ultrasound of the arteries in the legs. It looks at arterial blood flow in the legs. Allow one hour for Lower Arterial scans. There are no restrictions or special instructions  Your physician has requested that you have an ankle brachial index (ABI). During this test an ultrasound and blood pressure cuff are used to evaluate the arteries that supply the arms and legs with blood. Allow thirty minutes for this exam. There are no restrictions or special instructions.  This procedures are done at [address] [facility] 2nd Floor. To be done in May 2023.   Follow-Up: At [facility], you and your health needs are our priority.  As part of our continuing mission to provide you with exceptional heart care, we have created designated Provider Care Teams.  These Care Teams include your primary Cardiologist (physician) and Advanced Practice Providers (APPs -  Physician Assistants and Nurse Practitioners) who all work together to provide you with the care you need, when you need it.  We recommend signing up for the patient portal called "MyChart".  Sign up information is provided on this After Visit Summary.  MyChart is used to connect with patients for Virtual Visits (Telemedicine).  Patients are able to view lab/test results, encounter notes, upcoming appointments, etc.  Non-urgent messages can be sent to your provider as well.   To learn more about what you can do with MyChart, go to [email or web address].    Your next appointment:   12 month(s)  The format for your next appointment:   In Person  Provider:    Nanetta Batty, MD

## 2020-07-30 ENCOUNTER — Telehealth: Payer: Self-pay | Admitting: Cardiovascular Disease

## 2020-07-30 MED ORDER — PRALUENT 150 MG/ML ~~LOC~~ SOAJ: 150.0000 mg | SUBCUTANEOUS | 11 refills | Status: DC

## 2020-07-30 NOTE — Telephone Encounter (Signed)
Called and lmom pt that the rx couldn't be sent to [facility] but I sent to [facility] and called and they stated that the cost is about $9 for a one month supply and he should call be back if I can help with anything else

## 2020-07-30 NOTE — Telephone Encounter (Signed)
Returned call to patient regarding recent Lipid panel results. Made patient aware that per Dr. Allyson Sabal his cholesterol is significantly increased compared to 1 year ago. Patient states that he is not currently taking his Praluent due to not being able to afford it and his insurance not paying for it. Patient states that he has not taken it for over a year. Patient is currently taking his pravastatin 10mg  daily. Patient would like to know what he should do regarding his cholesterol. Advised patient that I would forward message to Dr. and our PharmD department for review and advice. Patient verbalized understanding.

## 2020-07-30 NOTE — Addendum Note (Signed)
Addended by: Eather Colas on: 07/30/2020 02:53 PM   Modules accepted: Orders

## 2020-07-30 NOTE — Telephone Encounter (Signed)
° °  Pt is returning call to get lab result °

## 2020-07-30 NOTE — Telephone Encounter (Signed)
Loa Socks (Key: [id number])  Your PA request has been sent to [employer]. Waiting 1 minute for a real-time reply.Marland KitchenMarland Kitchen

## 2020-07-30 NOTE — Telephone Encounter (Signed)
Haleigh,  Please re-do PA for praluent. Once approved please call into pharmacy and find out the cost so we can discuss with patient.

## 2020-08-05 DIAGNOSIS — H11021 Central pterygium of right eye: Secondary | ICD-10-CM | POA: Diagnosis not present

## 2020-08-05 DIAGNOSIS — H11002 Unspecified pterygium of left eye: Secondary | ICD-10-CM | POA: Diagnosis not present

## 2020-08-05 DIAGNOSIS — H11052 Peripheral pterygium, progressive, left eye: Secondary | ICD-10-CM | POA: Diagnosis not present

## 2020-08-20 ENCOUNTER — Other Ambulatory Visit: Payer: Self-pay | Admitting: Cardiovascular Disease

## 2020-08-20 NOTE — Telephone Encounter (Signed)
Rx(s) sent to pharmacy electronically.  

## 2020-09-16 ENCOUNTER — Emergency Department (HOSPITAL_COMMUNITY)
Admission: EM | Admit: 2020-09-16 | Discharge: 2020-09-16 | Disposition: A | Discharge status: Home / Self Care | Payer: Medicare HMO | Attending: Emergency Medicine | Admitting: Emergency Medicine

## 2020-09-16 ENCOUNTER — Encounter (HOSPITAL_COMMUNITY): Payer: Self-pay

## 2020-09-16 DIAGNOSIS — Z87891 Personal history of nicotine dependence: Secondary | ICD-10-CM | POA: Insufficient documentation

## 2020-09-16 DIAGNOSIS — R110 Nausea: Secondary | ICD-10-CM | POA: Diagnosis not present

## 2020-09-16 DIAGNOSIS — R112 Nausea with vomiting, unspecified: Secondary | ICD-10-CM | POA: Insufficient documentation

## 2020-09-16 DIAGNOSIS — R61 Generalized hyperhidrosis: Secondary | ICD-10-CM | POA: Diagnosis not present

## 2020-09-16 DIAGNOSIS — Z7982 Long term (current) use of aspirin: Secondary | ICD-10-CM | POA: Insufficient documentation

## 2020-09-16 DIAGNOSIS — Z743 Need for continuous supervision: Secondary | ICD-10-CM | POA: Diagnosis not present

## 2020-09-16 DIAGNOSIS — I10 Essential (primary) hypertension: Secondary | ICD-10-CM | POA: Diagnosis not present

## 2020-09-16 DIAGNOSIS — I2510 Atherosclerotic heart disease of native coronary artery without angina pectoris: Secondary | ICD-10-CM | POA: Diagnosis not present

## 2020-09-16 DIAGNOSIS — Z79899 Other long term (current) drug therapy: Secondary | ICD-10-CM | POA: Insufficient documentation

## 2020-09-16 DIAGNOSIS — H25811 Combined forms of age-related cataract, right eye: Secondary | ICD-10-CM | POA: Diagnosis not present

## 2020-09-16 DIAGNOSIS — R001 Bradycardia, unspecified: Secondary | ICD-10-CM | POA: Diagnosis not present

## 2020-09-16 MED ORDER — ONDANSETRON 4 MG PO TBDP
4.0000 mg | ORAL_TABLET | Freq: Three times a day (TID) | ORAL | 0 refills | Status: DC | PRN
Start: 2020-09-16 — End: 2023-12-13

## 2020-09-16 NOTE — ED Notes (Signed)
George Cooper son [phone] would like an update

## 2020-09-16 NOTE — Discharge Instructions (Addendum)
It was a pleasure taking care of you today. Your physical exam was reassuring. Your nausea and vomiting could be related to the medication they gave you in the OR. I am sending you home with nausea medication. Take as needed. Follow-up with PCP within the next week. Return to the ER for new or worsening symptoms.

## 2020-09-16 NOTE — ED Triage Notes (Signed)
Pt was about to have cataract removal today. Pt was in the OR when 2mg  IVP Versed given to pt. Pt became diaphoretic, nauseous and started vomiting. 800cc bolus LR and 4mg  IVP Zofran given prior to coming to [facility]. Blood sugar was 120. Pt is alert and oriented x 4.

## 2020-09-16 NOTE — ED Provider Notes (Signed)
[facility] Provider Note   CSN: [id number] Arrival date & time: 09/16/20  1525     History Chief Complaint  Patient presents with   Vomiting    George Cooper is a 65 y.o. male with a past medical history significant for CAD, hyperlipidemia, Lyme disease, tobacco abuse, and renal artery stenosis who presents to the ED due to nausea and vomiting that started just prior to arrival.  Patient was in the OR about to have cataract surgery when he became acutely diaphoretic and nauseous.  Patient admits to 3 episodes of nonbloody, nonbilious emesis prior to arrival.  Symptoms started after IV Versed was given to patient.  Patient denies any previous fear or similar reactions to needles. Patient given 800 cc bolus of LR and 4 mg of Zofran prior to arrival.  Patient denies abdominal pain and diarrhea.  No fever or chills.  No previous abdominal operations.  No further emesis since arriving to the ED. No urinary symptoms. Admits to marijuana use. No chest pain or shortness of breath.   History obtained from patient and past medical records. No interpreter used during encounter.       Past Medical History:  Diagnosis Date   Arthritis    "right knee" (09/18/2012)   Carotid artery disease (HCC)    Claudication (HCC)    a. 11/09/15 Hawk atherectomy of SFA   Dizziness    History of gout    Hyperlipidemia    Lyme disease 2006   "after bit by a tick" (09/18/2012)   Renal artery stenosis (HCC)    Tobacco abuse    discontinued October 2014    Patient Active Problem List   Diagnosis Date Noted   Claudication in peripheral vascular disease (HCC) 06/17/2019   Essential hypertension 02/26/2019   CVA (cerebral infarction) 11/16/2015   Left-sided weakness 11/16/2015   Headache 11/16/2015   Hyperlipidemia 10/10/2012   Pain of right thigh 09/21/2012   PAD (peripheral artery disease), s/p athrectomy, PTA/ stent Rt. SFA  09/18/2012 09/19/2012   Carotid artery  disease (HCC) 08/01/2012   Dizziness 08/01/2012   Claudication [facility](HCC)     Past Surgical History:  Procedure Laterality Date   ABDOMINAL ANGIOGRAM  09/18/2012   Procedure: ABDOMINAL ANGIOGRAM;  Surgeon: Runell GessJonathan J Berry, MD;  [facility];  Service: Cardiovascular;;   ABDOMINAL AORTOGRAM W/LOWER EXTREMITY N/A 06/17/2019   Procedure: ABDOMINAL AORTOGRAM W/LOWER EXTREMITY;  Surgeon: Runell GessBerry, Jonathan J, MD;  Location: [facility] INVASIVE CV LAB;  Service: Cardiovascular;  Laterality: N/A;   ATHERECTOMY Right 09/18/2012   Procedure: ATHERECTOMY;  Surgeon: Runell GessJonathan J Berry, MD;  [facility];  Service: Cardiovascular;  Laterality: Right;  right SFA   KNEE ARTHROSCOPY Right 1990's   "twice" (09/18/2012)   KNEE ARTHROSCOPY Left 1980's   LOWER EXTREMITY ANGIOGRAM N/A 09/18/2012   Procedure: LOWER EXTREMITY ANGIOGRAM;  Surgeon: Runell GessJonathan J Berry, MD;  [facility];  Service: Cardiovascular;  Laterality: N/A;   PERCUTANEOUS STENT INTERVENTION  09/18/2012   Procedure: PERCUTANEOUS STENT INTERVENTION;  Surgeon: Runell GessJonathan J Berry, MD;  [facility];  Service: Cardiovascular;;  right SFA   PERIPHERAL ARTERIAL STENT GRAFT Right 09/18/2012   "PTA & stenting right SFA"/notes 09/18/2012   PERIPHERAL VASCULAR CATHETERIZATION N/A 11/09/2015   Procedure: Abdominal Aortogram w/Lower Extremity;  Surgeon: Runell GessJonathan J Berry, MD;  Location: [facility] INVASIVE CV LAB;  Service: Cardiovascular;  Laterality: N/A;   PERIPHERAL VASCULAR CATHETERIZATION Right 11/09/2015   Procedure: Peripheral  Vascular Balloon Angioplasty;  Surgeon: Runell Gess, MD;  Location: [facility] INVASIVE CV LAB;  Service: Cardiovascular;  Laterality: Right;  SFA   PERIPHERAL VASCULAR CATHETERIZATION Right 11/09/2015   Procedure: Peripheral Vascular Atherectomy;  Surgeon: Runell Gess, MD;  Location: [facility] INVASIVE CV LAB;  Service: Cardiovascular;  Laterality: Right;  SFA   PERIPHERAL VASCULAR INTERVENTION Right 06/17/2019   Procedure:  PERIPHERAL VASCULAR INTERVENTION;  Surgeon: Runell Gess, MD;  Location: [facility] INVASIVE CV LAB;  Service: Cardiovascular;  Laterality: Right;  superficial femoral       Family History  Problem Relation Age of Onset   Heart Problems Mother        CABG, PCI   Stroke Father    Heart failure Maternal Grandmother    Heart attack Maternal Grandfather    Heart failure Paternal Grandmother    Heart attack Paternal Grandfather    Stroke Paternal Grandfather    Diabetes Paternal Grandfather    Heart Problems Sister        PCI    Social History   Tobacco Use   Smoking status: Former    Packs/day: 0.20    Years: 40.00    Pack years: 8.00    Types: Cigarettes    Quit date: 11/29/2012    Years since quitting: 7.8   Smokeless tobacco: Never   Tobacco comments:    trying to quit, smokes 2-3 daily  Substance Use Topics   Alcohol use: No    Alcohol/week: 0.0 standard drinks   Drug use: Yes    Types: Marijuana    Comment: "every once in a while"    Home Medications Prior to Admission medications   Medication Sig Start Date End Date Taking? Authorizing Provider  ondansetron (ZOFRAN ODT) 4 MG disintegrating tablet Take 1 tablet (4 mg total) by mouth every 8 (eight) hours as needed for nausea or vomiting. 09/16/20  Yes Anuar Walgren C, PA-C  Alirocumab (PRALUENT) 150 MG/ML SOAJ Inject 150 mg into the skin every 14 (fourteen) days. 07/30/20   Runell Gess, MD  amLODipine (NORVASC) 10 MG tablet TAKE (1) TABLET BY MOUTH ONCE DAILY. 07/06/20   Runell Gess, MD  aspirin EC 81 MG tablet Take 1 tablet (81 mg total) by mouth daily. 01/09/18   Runell Gess, MD  clopidogrel (PLAVIX) 75 MG tablet TAKE 1 TABLET WITH BREAKFAST. 08/20/20   Runell Gess, MD  naproxen sodium (ANAPROX) 220 MG tablet Take 220 mg by mouth daily as needed.    [provider]  pravastatin (PRAVACHOL) 10 MG tablet Take 10 mg by mouth daily.    [provider]    Allergies    Patient  has no known allergies.  Review of Systems   Review of Systems  Constitutional:  Negative for chills and fever.  Respiratory:  Negative for shortness of breath.   Cardiovascular:  Negative for chest pain.  Gastrointestinal:  Positive for nausea and vomiting. Negative for abdominal pain and diarrhea.  Genitourinary:  Negative for dysuria.  All other systems reviewed and are negative.  Physical Exam Updated Vital Signs BP (!) 147/82 (BP Location: Right Arm)   Pulse 68   Temp 98 F (36.7 C) (Oral)   Resp 15   Ht 5\' 7"  (1.702 m)   Wt 74.8 kg   SpO2 100%   BMI 25.84 kg/m   Physical Exam Vitals and nursing note reviewed.  Constitutional:      General: He is not in acute distress.  Appearance: He is not ill-appearing.  HENT:     Head: Normocephalic.  Eyes:     Pupils: Pupils are equal, round, and reactive to light.  Cardiovascular:     Rate and Rhythm: Normal rate and regular rhythm.     Pulses: Normal pulses.     Heart sounds: Normal heart sounds. No murmur heard.   No friction rub. No gallop.  Pulmonary:     Effort: Pulmonary effort is normal.     Breath sounds: Normal breath sounds.  Abdominal:     General: Abdomen is flat. There is no distension.     Palpations: Abdomen is soft.     Tenderness: There is no abdominal tenderness. There is no guarding or rebound.     Comments: Abdomen soft, nondistended, nontender to palpation in all quadrants without guarding or peritoneal signs. No rebound.   Musculoskeletal:        General: Normal range of motion.     Cervical back: Neck supple.  Skin:    General: Skin is warm and dry.  Neurological:     General: No focal deficit present.     Mental Status: He is alert.  Psychiatric:        Mood and Affect: Mood normal.        Behavior: Behavior normal.    ED Results / Procedures / Treatments   Labs (all labs ordered are listed, but only abnormal results are displayed) Labs Reviewed - No data to display  EKG EKG  Interpretation  Date/Time:  Wednesday September 16 2020 17:13:57 EDT Ventricular Rate:  58 PR Interval:  134 QRS Duration: 104 QT Interval:  462 QTC Calculation: 453 R Axis:   67 Text Interpretation: Sinus bradycardia with Premature atrial complexes Lateral infarct , age undetermined Possible Inferior infarct , age undetermined Abnormal ECG Similar to 2017 tracing Confirmed by Alona Bene [phone]) on 09/16/2020 5:18:49 PM  Radiology No results found.  Procedures Procedures   Medications Ordered in ED Medications - No data to display  ED Course  I have reviewed the triage vital signs and the nursing notes.  Pertinent labs & imaging results that were available during my care of the patient were reviewed by me and considered in my medical decision making (see chart for details).    MDM Rules/Calculators/A&P                           66 year old male presents to the ED due to 3 episodes of nonbloody, nonbilious emesis while in the OR prior to cataract surgery.  No abdominal pain, fever/chills, diarrhea.  Nausea and vomiting had resolved prior to my initial evaluation.  Patient given IV fluids and Zofran in route to the ED.  Upon arrival, stable vitals.  Patient is afebrile, not tachycardic or hypoxic.  Patient in no acute distress.  Abdomen soft, nondistended, nontender. Patient deferred any lab work because he is currently asymptomatic. Given benign abdominal exam, low suspicion for acute abdomen. Nausea and vomiting could be related to versed. No rash or respiratory symptoms to suggest allergic reaction. Patient able to tolerate po while here in the ED. patient discharged with Zofran as needed for nausea vomiting.  Advised patient to follow-up with PCP within 1 week for further evaluation. Strict ED precautions discussed with patient. Patient states understanding and agrees to plan. Patient discharged home in no acute distress and stable vitals.  Discussed case with Dr. Jacqulyn Bath who agrees with  assessment and  plan.  Final Clinical Impression(s) / ED Diagnoses Final diagnoses:  Non-intractable vomiting with nausea, unspecified vomiting type    Rx / DC Orders ED Discharge Orders          Ordered    ondansetron (ZOFRAN ODT) 4 MG disintegrating tablet  Every 8 hours PRN        09/16/20 1650             Jesusita Oka 09/16/20 1727    Maia Plan, MD 09/21/20 1259

## 2020-09-16 NOTE — ED Notes (Signed)
Passed PO challenge. Cleared for discharge.

## 2020-09-30 DIAGNOSIS — H2511 Age-related nuclear cataract, right eye: Secondary | ICD-10-CM | POA: Diagnosis not present

## 2020-10-07 ENCOUNTER — Other Ambulatory Visit (HOSPITAL_COMMUNITY): Payer: Self-pay | Admitting: Cardiovascular Disease

## 2020-10-07 ENCOUNTER — Ambulatory Visit (HOSPITAL_COMMUNITY)
Admission: RE | Admit: 2020-10-07 | Discharge: 2020-10-07 | Disposition: A | Discharge status: Home / Self Care | Payer: Medicare HMO | Source: Ambulatory Visit | Attending: Cardiology | Admitting: Cardiology

## 2020-10-07 ENCOUNTER — Other Ambulatory Visit: Payer: Self-pay

## 2020-10-07 DIAGNOSIS — I6523 Occlusion and stenosis of bilateral carotid arteries: Secondary | ICD-10-CM | POA: Diagnosis not present

## 2020-10-07 DIAGNOSIS — I6522 Occlusion and stenosis of left carotid artery: Secondary | ICD-10-CM

## 2020-10-08 ENCOUNTER — Encounter: Payer: Self-pay | Admitting: *Deleted

## 2020-10-10 ENCOUNTER — Other Ambulatory Visit: Payer: Self-pay | Admitting: Cardiovascular Disease

## 2020-10-14 DIAGNOSIS — H2512 Age-related nuclear cataract, left eye: Secondary | ICD-10-CM | POA: Diagnosis not present

## 2020-10-14 DIAGNOSIS — H25812 Combined forms of age-related cataract, left eye: Secondary | ICD-10-CM | POA: Diagnosis not present

## 2020-10-30 DIAGNOSIS — Z6825 Body mass index (BMI) 25.0-25.9, adult: Secondary | ICD-10-CM | POA: Diagnosis not present

## 2020-10-30 DIAGNOSIS — Z0001 Encounter for general adult medical examination with abnormal findings: Secondary | ICD-10-CM | POA: Diagnosis not present

## 2020-10-30 DIAGNOSIS — E782 Mixed hyperlipidemia: Secondary | ICD-10-CM | POA: Diagnosis not present

## 2020-10-30 DIAGNOSIS — Z0000 Encounter for general adult medical examination without abnormal findings: Secondary | ICD-10-CM | POA: Diagnosis not present

## 2020-10-30 DIAGNOSIS — Z1389 Encounter for screening for other disorder: Secondary | ICD-10-CM | POA: Diagnosis not present

## 2020-11-19 DIAGNOSIS — Z961 Presence of intraocular lens: Secondary | ICD-10-CM | POA: Diagnosis not present

## 2020-12-24 DIAGNOSIS — N529 Male erectile dysfunction, unspecified: Secondary | ICD-10-CM | POA: Diagnosis not present

## 2020-12-24 DIAGNOSIS — Z6825 Body mass index (BMI) 25.0-25.9, adult: Secondary | ICD-10-CM | POA: Diagnosis not present

## 2021-01-16 ENCOUNTER — Other Ambulatory Visit: Payer: Self-pay | Admitting: Cardiovascular Disease

## 2021-02-09 ENCOUNTER — Ambulatory Visit (HOSPITAL_COMMUNITY)
Admission: RE | Admit: 2021-02-09 | Discharge: 2021-02-09 | Disposition: A | Discharge status: Home / Self Care | Payer: Medicare HMO | Source: Ambulatory Visit | Attending: Cardiovascular Disease | Admitting: Cardiovascular Disease

## 2021-02-09 ENCOUNTER — Other Ambulatory Visit: Payer: Self-pay

## 2021-02-09 DIAGNOSIS — I739 Peripheral vascular disease, unspecified: Secondary | ICD-10-CM | POA: Diagnosis not present

## 2021-02-19 ENCOUNTER — Other Ambulatory Visit: Payer: Self-pay

## 2021-02-19 DIAGNOSIS — E785 Hyperlipidemia, unspecified: Secondary | ICD-10-CM

## 2021-02-20 ENCOUNTER — Other Ambulatory Visit: Payer: Self-pay | Admitting: Cardiovascular Disease

## 2021-03-04 DIAGNOSIS — E785 Hyperlipidemia, unspecified: Secondary | ICD-10-CM | POA: Diagnosis not present

## 2021-03-05 LAB — HEPATIC FUNCTION PANEL
ALT: 17 IU/L (ref 0–44)
AST: 21 IU/L (ref 0–40)
Albumin: 4.6 g/dL (ref 3.8–4.8)
Alkaline Phosphatase: 72 IU/L (ref 44–121)
Bilirubin Total: 0.3 mg/dL (ref 0.0–1.2)
Bilirubin, Direct: 0.14 mg/dL (ref 0.00–0.40)
Total Protein: 6.7 g/dL (ref 6.0–8.5)

## 2021-03-05 LAB — LIPID PANEL
Chol/HDL Ratio: 2 ratio (ref 0.0–5.0)
Cholesterol, Total: 89 mg/dL — ABNORMAL LOW (ref 100–199)
HDL: 45 mg/dL (ref >39)
LDL Chol Calc (NIH): 29 mg/dL (ref 0–99)
Triglycerides: 68 mg/dL (ref 0–149)
VLDL Cholesterol Cal: 15 mg/dL (ref 5–40)

## 2021-03-30 ENCOUNTER — Other Ambulatory Visit: Payer: Self-pay | Admitting: Cardiovascular Disease

## 2021-05-17 DIAGNOSIS — Z961 Presence of intraocular lens: Secondary | ICD-10-CM | POA: Diagnosis not present

## 2021-05-17 DIAGNOSIS — H524 Presbyopia: Secondary | ICD-10-CM | POA: Diagnosis not present

## 2021-06-18 ENCOUNTER — Other Ambulatory Visit: Payer: Self-pay | Admitting: Cardiovascular Disease

## 2021-06-21 ENCOUNTER — Other Ambulatory Visit: Payer: Self-pay | Admitting: Cardiovascular Disease

## 2021-07-17 ENCOUNTER — Other Ambulatory Visit: Payer: Self-pay | Admitting: Cardiovascular Disease

## 2021-08-03 DIAGNOSIS — Z823 Family history of stroke: Secondary | ICD-10-CM | POA: Diagnosis not present

## 2021-08-03 DIAGNOSIS — I739 Peripheral vascular disease, unspecified: Secondary | ICD-10-CM | POA: Diagnosis not present

## 2021-08-03 DIAGNOSIS — I10 Essential (primary) hypertension: Secondary | ICD-10-CM | POA: Diagnosis not present

## 2021-08-03 DIAGNOSIS — Z833 Family history of diabetes mellitus: Secondary | ICD-10-CM | POA: Diagnosis not present

## 2021-08-03 DIAGNOSIS — Z8249 Family history of ischemic heart disease and other diseases of the circulatory system: Secondary | ICD-10-CM | POA: Diagnosis not present

## 2021-08-03 DIAGNOSIS — Z809 Family history of malignant neoplasm, unspecified: Secondary | ICD-10-CM | POA: Diagnosis not present

## 2021-08-03 DIAGNOSIS — Z87891 Personal history of nicotine dependence: Secondary | ICD-10-CM | POA: Diagnosis not present

## 2021-08-03 DIAGNOSIS — Z7902 Long term (current) use of antithrombotics/antiplatelets: Secondary | ICD-10-CM | POA: Diagnosis not present

## 2021-08-03 DIAGNOSIS — I2510 Atherosclerotic heart disease of native coronary artery without angina pectoris: Secondary | ICD-10-CM | POA: Diagnosis not present

## 2021-08-03 DIAGNOSIS — Z008 Encounter for other general examination: Secondary | ICD-10-CM | POA: Diagnosis not present

## 2021-08-03 DIAGNOSIS — Z7982 Long term (current) use of aspirin: Secondary | ICD-10-CM | POA: Diagnosis not present

## 2021-08-03 DIAGNOSIS — E785 Hyperlipidemia, unspecified: Secondary | ICD-10-CM | POA: Diagnosis not present

## 2021-08-03 DIAGNOSIS — Z8673 Personal history of transient ischemic attack (TIA), and cerebral infarction without residual deficits: Secondary | ICD-10-CM | POA: Diagnosis not present

## 2021-10-01 DIAGNOSIS — Z6823 Body mass index (BMI) 23.0-23.9, adult: Secondary | ICD-10-CM | POA: Diagnosis not present

## 2021-10-01 DIAGNOSIS — Z125 Encounter for screening for malignant neoplasm of prostate: Secondary | ICD-10-CM | POA: Diagnosis not present

## 2021-10-01 DIAGNOSIS — G459 Transient cerebral ischemic attack, unspecified: Secondary | ICD-10-CM | POA: Diagnosis not present

## 2021-10-01 DIAGNOSIS — Z0001 Encounter for general adult medical examination with abnormal findings: Secondary | ICD-10-CM | POA: Diagnosis not present

## 2021-10-01 DIAGNOSIS — E782 Mixed hyperlipidemia: Secondary | ICD-10-CM | POA: Diagnosis not present

## 2021-10-01 DIAGNOSIS — Z1331 Encounter for screening for depression: Secondary | ICD-10-CM | POA: Diagnosis not present

## 2021-10-07 ENCOUNTER — Ambulatory Visit (HOSPITAL_COMMUNITY)
Admission: RE | Admit: 2021-10-07 | Discharge: 2021-10-07 | Disposition: A | Discharge status: Home / Self Care | Payer: Medicare HMO | Source: Ambulatory Visit | Attending: Cardiovascular Disease | Admitting: Cardiovascular Disease

## 2021-10-07 DIAGNOSIS — I6522 Occlusion and stenosis of left carotid artery: Secondary | ICD-10-CM | POA: Diagnosis not present

## 2021-12-23 ENCOUNTER — Encounter: Payer: Self-pay | Admitting: Cardiovascular Disease

## 2021-12-23 ENCOUNTER — Ambulatory Visit: Payer: Medicare HMO | Attending: Cardiovascular Disease | Admitting: Cardiovascular Disease

## 2021-12-23 ENCOUNTER — Other Ambulatory Visit (HOSPITAL_COMMUNITY): Payer: Self-pay | Admitting: Cardiovascular Disease

## 2021-12-23 VITALS — BP 108/62 | HR 57 | Ht 69.0 in | Wt 159.4 lb

## 2021-12-23 DIAGNOSIS — I739 Peripheral vascular disease, unspecified: Secondary | ICD-10-CM

## 2021-12-23 DIAGNOSIS — E782 Mixed hyperlipidemia: Secondary | ICD-10-CM | POA: Diagnosis not present

## 2021-12-23 DIAGNOSIS — I10 Essential (primary) hypertension: Secondary | ICD-10-CM

## 2021-12-23 DIAGNOSIS — I6523 Occlusion and stenosis of bilateral carotid arteries: Secondary | ICD-10-CM

## 2021-12-23 NOTE — Progress Notes (Signed)
12/23/2021 George Cooper   Nov 05, 1955  [id number]  Primary Physician Assunta Found, MD Primary Cardiologist: Runell Gess MD Nicholes Calamity, [place]  HPI:  George Cooper is a 66 y.o.   thin appearing married Caucasian male father of 2, grandfather of 2 grandchildren works as an Personnel officer. He was referred by Dr. Yetta Numbers , at [facility], in [place] for evaluation of dizziness and carotid artery disease. I last saw him in the office 07/28/2020.Marland KitchenHe denies chest pain or shortness of breath. He did complain however of right calf claudication. His cardiovascular risk profile is positive for a 20-pack-year history of tobacco abuse discontinued 10/14. He is not diabetic, hypertensive or hyperlipidemic. His mother did have bypass surgery in her 44s and his sister has had coronary stents as well. A stress test which was normal. Lower extremity arterial Dopplers revealed a high-frequency signal in the proximal segment of his right SFA. On 09/18/12 the patient underwent peripheral angiography, directional atherectomy, PTA and stenting of a high-grade segmental proximal right SFA stenosis. He had excellent angiographic and clinical result. His Doppler show a widely patent proximal right SFA and his claudication has resolved. His lower extremity arterial Doppler studies performed 05/14/13 revealed a widely patent stent with ABIs 1 bilaterally. He complains of mild right calf claudication with significant exertion. Since I saw him last he remained stable. He denies chest pain, shortness of breath . Lower extremity Dopplers performed 08/27/15 revealed a decline in his right ABI from one down to 0.91 with a new high-frequency signal in his distal right SFA. He underwent peripheral angiography by myself 11/09/15 feeling a patent mid right SFA stent with a 95% stenosis just beyond this. I performed one directional atherectomy followed by drug-eluting balloon angioplasty with excellent angiographic and  clinical result. His claudication has completely resolved as Dopplers performed on 11/11/15 have normalized. He was admitted to the hospital on 11/16/15 with a transient neurologic event with negative workup. Since I saw him in the office in October of last year he was remained stable. He denies chest pain, shortness of breath or claudication. He is on Repatha  with an excellent clinical result. His most recent lipid profile performed 02/20/2017 revealed total cholesterol of 82, LDL 23 and HDL 43.  He had follow-up lower extremity arterial Doppler studies performed 08/31/16 revealing a new high frequency signal in his mid right SFA although he denies significant claudication.   Doppler studies performed 04/19/2019 revealing a right ankle-brachial index of 0.88 with a new high-frequency signal in his right SFA stent suggesting in-stent restenosis.  He has developed lifestyle limiting claudication on the right side .   As result of this I performed peripheral angiography on him 06/17/2019 revealing 95% "in-stent restenosis" within the previously placed right SFA stent in the distal aspect.  There is three-vessel runoff.  I restented this with an Eluvia 6 mm x 80 mm long self-expanding drug-coated stent.  He was discharged home the same day.  His claudication has resolved and his Dopplers are normalized.   Since I saw him in the office approximately a year and a half ago he continues to do well.  He has lost 15 pounds as result of diet and exercise.  He does 100 push-ups a day and walks daily and is eliminated carbohydrates and sugar from his diet.  He feels clinically improved.  He denies chest pain or shortness of breath.  He does have mild right calf claudication when walking brisk  long distances but this is not lifestyle limiting.  Lower extremity arterial Doppler studies performed 07/08/2020 revealed a right ABI of 0.91, left of the 1.18 with a high-frequency signal in his right SFA stent.   Current Meds   Medication Sig   amLODipine (NORVASC) 10 MG tablet TAKE (1) TABLET BY MOUTH ONCE DAILY.   aspirin EC 81 MG tablet Take 1 tablet (81 mg total) by mouth daily.   clopidogrel (PLAVIX) 75 MG tablet TAKE 1 TABLET WITH BREAKFAST.   naproxen sodium (ANAPROX) 220 MG tablet Take 220 mg by mouth daily as needed.   ondansetron (ZOFRAN ODT) 4 MG disintegrating tablet Take 1 tablet (4 mg total) by mouth every 8 (eight) hours as needed for nausea or vomiting.   pravastatin (PRAVACHOL) 20 MG tablet Take 20 mg by mouth daily.   sildenafil (VIAGRA) 100 MG tablet Take 100 mg by mouth daily.     No Known Allergies  Social History   Socioeconomic History   Marital status: Married    Spouse name: Not on file   Number of children: Not on file   Years of education: Not on file   Highest education level: Not on file  Occupational History   Not on file  Tobacco Use   Smoking status: Former    Packs/day: 0.20    Years: 40.00    Total pack years: 8.00    Types: Cigarettes    Quit date: 11/29/2012    Years since quitting: 9.0   Smokeless tobacco: Never   Tobacco comments:    trying to quit, smokes 2-3 daily  Substance and Sexual Activity   Alcohol use: No    Alcohol/week: 0.0 standard drinks of alcohol   Drug use: Yes    Types: Marijuana    Comment: "every once in a while"   Sexual activity: Not on file  Other Topics Concern   Not on file  Social History Narrative   Not on file   Social Determinants of Health   Financial Resource Strain: Not on file  Food Insecurity: Not on file  Transportation Needs: Not on file  Physical Activity: Not on file  Stress: Not on file  Social Connections: Not on file  Intimate Partner Violence: Not on file     Review of Systems: General: negative for chills, fever, night sweats or weight changes.  Cardiovascular: negative for chest pain, dyspnea on exertion, edema, orthopnea, palpitations, paroxysmal nocturnal dyspnea or shortness of  breath Dermatological: negative for rash Respiratory: negative for cough or wheezing Urologic: negative for hematuria Abdominal: negative for nausea, vomiting, diarrhea, bright red blood per rectum, melena, or hematemesis Neurologic: negative for visual changes, syncope, or dizziness All other systems reviewed and are otherwise negative except as noted above.    Blood pressure 108/62, pulse (!) 57, height 5\' 9"  (1.753 m), weight 159 lb 6.4 oz (72.3 kg), SpO2 95 %.  General appearance: alert and no distress Neck: no adenopathy, no JVD, supple, symmetrical, trachea midline, thyroid not enlarged, symmetric, no tenderness/mass/nodules, and soft bilateral carotid bruits Lungs: clear to auscultation bilaterally Heart: regular rate and rhythm, S1, S2 normal, no murmur, click, rub or gallop Extremities: extremities normal, atraumatic, no cyanosis or edema Pulses: 2+ and symmetric Skin: Skin color, texture, turgor normal. No rashes or lesions Neurologic: Grossly normal  EKG sinus bradycardia at 57 with incomplete right bundle branch block.  I personally reviewed this EKG.  ASSESSMENT AND PLAN:   Claudication [facility]) History of PAD status post multiple interventions  on the right SFA beginning 09/18/2012.  The last intervention was 06/17/2019 at which time he had a 95% "in-stent restenosis with the previously placed stent.  I placed an Eluvia 6 mm x 80 mm long drug-coated stent with excellent result.  His Dopplers normalized as did his current claudication.  His most recent Dopplers performed 07/08/2020 revealed a right ABI of 0.91, left of 1.18.  He did have a high-frequency signal in his mid right SFA stent with velocity of 555 cm/s although he has minimal claudication.  We will continue to follow this noninvasively.  Carotid artery disease (HCC) History of carotid artery disease status post Doppler studies performed 10/07/2021 revealing mild to moderate left common carotid artery stenosis.  We will  continue to follow this on an annual basis.  Hyperlipidemia History of hyperlipidemia on pravastatin and Repatha with lipid profile performed 03/04/2021 revealing total cholesterol 89, LDL 29 and HDL 45.  Essential hypertension History of essential hypertension blood pressure measured today at 108/62.  He is on amlodipine.     Runell Gess MD FACP,FACC,FAHA, [facility] 12/23/2021 1:43 PM

## 2021-12-23 NOTE — Assessment & Plan Note (Signed)
History of PAD status post multiple interventions on the right SFA beginning 09/18/2012.  The last intervention was 06/17/2019 at which time he had a 95% "in-stent restenosis with the previously placed stent.  I placed an Eluvia 6 mm x 80 mm long drug-coated stent with excellent result.  His Dopplers normalized as did his current claudication.  His most recent Dopplers performed 07/08/2020 revealed a right ABI of 0.91, left of 1.18.  He did have a high-frequency signal in his mid right SFA stent with velocity of 555 cm/s although he has minimal claudication.  We will continue to follow this noninvasively.

## 2021-12-23 NOTE — Assessment & Plan Note (Signed)
History of hyperlipidemia on pravastatin and Repatha with lipid profile performed 03/04/2021 revealing total cholesterol 89, LDL 29 and HDL 45.

## 2021-12-23 NOTE — Assessment & Plan Note (Signed)
History of essential hypertension blood pressure measured today at 108/62.  He is on amlodipine.

## 2021-12-23 NOTE — Patient Instructions (Signed)
Medication Instructions:  The current medical regimen is effective;  continue present plan and medications.  *If you need a refill on your cardiac medications before your next appointment, please call your pharmacy*   Testing/Procedures: Your physician has requested that you have a lower or upper extremity arterial duplex. This test is an ultrasound of the arteries in the legs or arms. It looks at arterial blood flow in the legs and arms. Allow one hour for Lower and Upper Arterial scans. There are no restrictions or special instructions   Your physician has requested that you have a carotid duplex (August 2024). This test is an ultrasound of the carotid arteries in your neck. It looks at blood flow through these arteries that supply the brain with blood. Allow one hour for this exam. There are no restrictions or special instructions.    Follow-Up: At [facility], you and your health needs are our priority.  As part of our continuing mission to provide you with exceptional heart care, we have created designated Provider Care Teams.  These Care Teams include your primary Cardiologist (physician) and Advanced Practice Providers (APPs -  Physician Assistants and Nurse Practitioners) who all work together to provide you with the care you need, when you need it.  We recommend signing up for the patient portal called "MyChart".  Sign up information is provided on this After Visit Summary.  MyChart is used to connect with patients for Virtual Visits (Telemedicine).  Patients are able to view lab/test results, encounter notes, upcoming appointments, etc.  Non-urgent messages can be sent to your provider as well.   To learn more about what you can do with MyChart, go to [email or web address].    Your next appointment:   12 month(s)  The format for your next appointment:   In Person  Provider:   Quay Burow, MD

## 2021-12-23 NOTE — Assessment & Plan Note (Signed)
History of carotid artery disease status post Doppler studies performed 10/07/2021 revealing mild to moderate left common carotid artery stenosis.  We will continue to follow this on an annual basis.

## 2021-12-24 ENCOUNTER — Telehealth: Payer: Self-pay | Admitting: Cardiovascular Disease

## 2021-12-24 NOTE — Telephone Encounter (Signed)
Pt c/o medication issue:  1. Name of Medication:   PRALUENT 150 MG/ML SOAJ    2. How are you currently taking this medication (dosage and times per day)?   3. Are you having a reaction (difficulty breathing--STAT)?   4. What is your medication issue?  Pt's insurance says that praluent will be no longer covered in January, but they will cover Rapatha at that time.

## 2021-12-24 NOTE — Addendum Note (Signed)
Addended by: Orma Render on: 12/24/2021 09:39 AM   Modules accepted: Orders

## 2021-12-24 NOTE — Addendum Note (Signed)
Addended by: Orma Render on: 12/24/2021 09:55 AM   Modules accepted: Orders

## 2021-12-24 NOTE — Telephone Encounter (Signed)
Message sent to Pharm D for advice. °

## 2021-12-28 NOTE — Telephone Encounter (Signed)
We will switch him over to George Cooper in 2024. Recommend patient try to fill Praluent between now and 02/21/22

## 2022-01-03 ENCOUNTER — Ambulatory Visit (HOSPITAL_COMMUNITY)
Admission: RE | Admit: 2022-01-03 | Discharge: 2022-01-03 | Disposition: A | Discharge status: Home / Self Care | Payer: Medicare HMO | Source: Ambulatory Visit | Attending: Cardiovascular Disease | Admitting: Cardiovascular Disease

## 2022-01-03 DIAGNOSIS — I739 Peripheral vascular disease, unspecified: Secondary | ICD-10-CM | POA: Diagnosis not present

## 2022-01-03 NOTE — Telephone Encounter (Signed)
Called patient left PharmD's advice on personal voice mail. 

## 2022-03-03 DIAGNOSIS — N529 Male erectile dysfunction, unspecified: Secondary | ICD-10-CM | POA: Diagnosis not present

## 2022-03-08 ENCOUNTER — Other Ambulatory Visit: Payer: Self-pay | Admitting: Cardiovascular Disease

## 2022-03-08 ENCOUNTER — Other Ambulatory Visit (HOSPITAL_COMMUNITY): Payer: Self-pay

## 2022-03-08 ENCOUNTER — Telehealth: Payer: Self-pay

## 2022-03-08 ENCOUNTER — Other Ambulatory Visit (HOSPITAL_COMMUNITY): Payer: Self-pay | Admitting: Cardiovascular Disease

## 2022-03-08 DIAGNOSIS — I739 Peripheral vascular disease, unspecified: Secondary | ICD-10-CM

## 2022-03-08 NOTE — Telephone Encounter (Signed)
Pharmacy Patient Advocate Encounter   Received notification from [employer] that prior authorization for Repatha 140mg /ml is required/requested.   PA submitted on 03/08/22 to (ins) [employer] via [employer] Status is pending

## 2022-03-09 MED ORDER — REPATHA SURECLICK 140 MG/ML ~~LOC~~ SOAJ: 1.0000 | SUBCUTANEOUS | 11 refills | Status: DC

## 2022-03-09 NOTE — Telephone Encounter (Signed)
Called pt to advise him that his formulary doesn't cover Praluent this year but covers Repatha instead. He used to take Repatha before his insurance made him change to [employer].  Rx for Repatha sent to pt's pharmacy. Pt appreciative for the call.

## 2022-03-09 NOTE — Telephone Encounter (Addendum)
Pharmacy Patient Advocate Encounter  Prior Authorization for Repatha 140mg /ml has been approved.    key# [id number] Effective dates: 1.1.24 through 12.31.2024  Please see that the patient has an active script on file for this rx, as Praluent is no longer the preferred.

## 2022-04-08 DIAGNOSIS — B079 Viral wart, unspecified: Secondary | ICD-10-CM | POA: Diagnosis not present

## 2022-04-08 DIAGNOSIS — Z6824 Body mass index (BMI) 24.0-24.9, adult: Secondary | ICD-10-CM | POA: Diagnosis not present

## 2022-04-08 DIAGNOSIS — L03113 Cellulitis of right upper limb: Secondary | ICD-10-CM | POA: Diagnosis not present

## 2022-04-23 ENCOUNTER — Other Ambulatory Visit: Payer: Self-pay | Admitting: Cardiovascular Disease

## 2022-05-01 ENCOUNTER — Ambulatory Visit
Admission: EM | Admit: 2022-05-01 | Discharge: 2022-05-01 | Disposition: A | Discharge status: Home / Self Care | Payer: Medicare HMO | Attending: Family Medicine | Admitting: Family Medicine

## 2022-05-01 DIAGNOSIS — R051 Acute cough: Secondary | ICD-10-CM | POA: Insufficient documentation

## 2022-05-01 DIAGNOSIS — J029 Acute pharyngitis, unspecified: Secondary | ICD-10-CM | POA: Diagnosis not present

## 2022-05-01 LAB — POCT RAPID STREP A (OFFICE): Rapid Strep A Screen: NEGATIVE

## 2022-05-01 MED ORDER — DEXAMETHASONE SODIUM PHOSPHATE 10 MG/ML IJ SOLN
10.0000 mg | Freq: Once | INTRAMUSCULAR | Status: AC
  Administered 2022-05-01: 10 mg via INTRAMUSCULAR

## 2022-05-01 NOTE — ED Provider Notes (Signed)
[facility]    CSN: [id number] Arrival date & time: 05/01/22  1513      History   Chief Complaint No chief complaint on file.   HPI George Cooper is a 67 y.o. male.   Patient presenting today with 5-day history of productive hacking cough, sore swollen feeling throat.  Denies significant congestion, fever, chills, headache, wheezing, abdominal pain, nausea vomiting or diarrhea.  So far not trying anything over-the-counter for symptoms.  Denies any history of chronic pulmonary disease or seasonal allergies.  Does state he was recently insulating an attic and wonders if the particles are causing his symptoms.   Past Medical History:  Diagnosis Date   Arthritis    "right knee" (09/18/2012)   Carotid artery disease ([place])    Claudication ([place])    a. 11/09/15 [place] atherectomy of SFA   Dizziness    History of gout    Hyperlipidemia    Lyme disease 2006   "after bit by a tick" (09/18/2012)   Renal artery stenosis ([place])    Tobacco abuse    discontinued October 2014    Patient Active Problem List   Diagnosis Date Noted   Claudication in peripheral vascular disease ([place]) 06/17/2019   Essential hypertension 02/26/2019   CVA (cerebral infarction) 11/16/2015   Left-sided weakness 11/16/2015   Headache 11/16/2015   Hyperlipidemia 10/10/2012   Pain of right thigh 09/21/2012   PAD (peripheral artery disease), s/p athrectomy, PTA/ stent Rt. SFA  09/18/2012 09/19/2012   Carotid artery disease ([place]) 08/01/2012   Dizziness 08/01/2012   Claudication [facility])     Past Surgical History:  Procedure Laterality Date   ABDOMINAL ANGIOGRAM  09/18/2012   Procedure: ABDOMINAL ANGIOGRAM;  Surgeon: Lorretta Harp, MD;  Location[facility] CATH LAB;  Service: Cardiovascular;;   ABDOMINAL AORTOGRAM W/LOWER EXTREMITY N/A 06/17/2019   Procedure: ABDOMINAL AORTOGRAM W/LOWER EXTREMITY;  Surgeon: Lorretta Harp, MD;  Location: [place] CV LAB;  Service: Cardiovascular;  Laterality: N/A;    ATHERECTOMY Right 09/18/2012   Procedure: ATHERECTOMY;  Surgeon: Lorretta Harp, MD;  Location: [facility] CATH LAB;  Service: Cardiovascular;  Laterality: Right;  right SFA   KNEE ARTHROSCOPY Right 1990's   "twice" (09/18/2012)   KNEE ARTHROSCOPY Left 1980's   LOWER EXTREMITY ANGIOGRAM N/A 09/18/2012   Procedure: LOWER EXTREMITY ANGIOGRAM;  Surgeon: Lorretta Harp, MD;  Location[facility] CATH LAB;  Service: Cardiovascular;  Laterality: N/A;   PERCUTANEOUS STENT INTERVENTION  09/18/2012   Procedure: PERCUTANEOUS STENT INTERVENTION;  Surgeon: Lorretta Harp, MD;  Location: [facility] CATH LAB;  Service: Cardiovascular;;  right SFA   PERIPHERAL ARTERIAL STENT GRAFT Right 09/18/2012   "PTA & stenting right SFA"/notes 09/18/2012   PERIPHERAL VASCULAR CATHETERIZATION N/A 11/09/2015   Procedure: Abdominal Aortogram w/Lower Extremity;  Surgeon: Lorretta Harp, MD;  Location: [place] CV LAB;  Service: Cardiovascular;  Laterality: N/A;   PERIPHERAL VASCULAR CATHETERIZATION Right 11/09/2015   Procedure: Peripheral Vascular Balloon Angioplasty;  Surgeon: Lorretta Harp, MD;  Location: [place] CV LAB;  Service: Cardiovascular;  Laterality: Right;  SFA   PERIPHERAL VASCULAR CATHETERIZATION Right 11/09/2015   Procedure: Peripheral Vascular Atherectomy;  Surgeon: Lorretta Harp, MD;  Location: [place] CV LAB;  Service: Cardiovascular;  Laterality: Right;  SFA   PERIPHERAL VASCULAR INTERVENTION Right 06/17/2019   Procedure: PERIPHERAL VASCULAR INTERVENTION;  Surgeon: Lorretta Harp, MD;  Location: [place] CV LAB;  Service: Cardiovascular;  Laterality: Right;  superficial femoral  Home Medications    Prior to Admission medications   Medication Sig Start Date End Date Taking? Authorizing Provider  amLODipine (NORVASC) 10 MG tablet TAKE (1) TABLET BY MOUTH ONCE DAILY. 04/25/22   Lorretta Harp, MD  aspirin EC 81 MG tablet Take 1 tablet (81 mg total) by mouth daily. 01/09/18   Lorretta Harp, MD   clopidogrel (PLAVIX) 75 MG tablet TAKE 1 TABLET WITH BREAKFAST. 03/09/22   Lorretta Harp, MD  Evolocumab (REPATHA SURECLICK) [id number] MG/ML SOAJ Inject 140 mg into the skin every 14 (fourteen) days. 03/09/22   Arnoldo Lenis, MD  naproxen sodium (ANAPROX) 220 MG tablet Take 220 mg by mouth daily as needed.    [provider]  ondansetron (ZOFRAN ODT) 4 MG disintegrating tablet Take 1 tablet (4 mg total) by mouth every 8 (eight) hours as needed for nausea or vomiting. 09/16/20   Suzy Bouchard, PA-C  pravastatin (PRAVACHOL) 20 MG tablet Take 20 mg by mouth daily. 11/29/21   [provider]  sildenafil (VIAGRA) 100 MG tablet Take 100 mg by mouth daily. 12/02/21   [provider]    Family History Family History  Problem Relation Age of Onset   Heart Problems Mother        CABG, PCI   Stroke Father    Heart failure Maternal Grandmother    Heart attack Maternal Grandfather    Heart failure Paternal Grandmother    Heart attack Paternal Grandfather    Stroke Paternal Grandfather    Diabetes Paternal Grandfather    Heart Problems Sister        PCI    Social History Social History   Tobacco Use   Smoking status: Former    Packs/day: 0.20    Years: 40.00    Total pack years: 8.00    Types: Cigarettes    Quit date: 11/29/2012    Years since quitting: 9.4   Smokeless tobacco: Never   Tobacco comments:    trying to quit, smokes 2-3 daily  Substance Use Topics   Alcohol use: No    Alcohol/week: 0.0 standard drinks of alcohol   Drug use: Yes    Types: Marijuana    Comment: "every once in a while"    Allergies   Patient has no known allergies.   Review of Systems Review of Systems PER HPI  Physical Exam Triage Vital Signs ED Triage Vitals  Enc Vitals Group     BP 05/01/22 1530 137/62     Pulse Rate 05/01/22 1530 70     Resp 05/01/22 1530 20     Temp 05/01/22 1530 98.3 F (36.8 C)     Temp Source 05/01/22 1530 Oral     SpO2 05/01/22 1530  95 %     Weight --      Height --      Head Circumference --      Peak Flow --      Pain Score 05/01/22 1532 8     Pain Loc --      Pain Edu? --      Excl. in [place]? --    No data found.  Updated Vital Signs BP 137/62 (BP Location: Right Arm)   Pulse 70   Temp 98.3 F (36.8 C) (Oral)   Resp 20   SpO2 95%   Visual Acuity Right Eye Distance:   Left Eye Distance:   Bilateral Distance:    Right Eye Near:   Left Eye  Near:    Bilateral Near:     Physical Exam Vitals and nursing note reviewed.  Constitutional:      Appearance: He is well-developed.  HENT:     Head: Atraumatic.     Right Ear: External ear normal.     Left Ear: External ear normal.     Nose: Nose normal.     Mouth/Throat:     Mouth: Mucous membranes are moist.     Pharynx: Oropharynx is clear. Posterior oropharyngeal erythema present. No oropharyngeal exudate.  Eyes:     Conjunctiva/sclera: Conjunctivae normal.     Pupils: Pupils are equal, round, and reactive to light.  Cardiovascular:     Rate and Rhythm: Normal rate and regular rhythm.  Pulmonary:     Effort: Pulmonary effort is normal. No respiratory distress.     Breath sounds: No wheezing or rales.  Musculoskeletal:        General: Normal range of motion.     Cervical back: Normal range of motion and neck supple.  Lymphadenopathy:     Cervical: No cervical adenopathy.  Skin:    General: Skin is warm and dry.  Neurological:     Mental Status: He is alert and oriented to person, place, and time.  Psychiatric:        Behavior: Behavior normal.    UC Treatments / Results  Labs (all labs ordered are listed, but only abnormal results are displayed) Labs Reviewed  CULTURE, GROUP A STREP [facility])  POCT RAPID STREP A (OFFICE)   EKG  Radiology No results found.  Procedures Procedures (including critical care time)  Medications Ordered in UC Medications  dexamethasone (DECADRON) injection 10 mg (10 mg Intramuscular Given 05/01/22 1618)     Initial Impression / Assessment and Plan / UC Course  I have reviewed the triage vital signs and the nursing notes.  Pertinent labs & imaging results that were available during my care of the patient were reviewed by me and considered in my medical decision making (see chart for details).     Vitals and exam reassuring, rapid strep negative, throat culture pending.  Suspect inflammatory in nature due to irritants in the environment he was working in but could possibly be viral as well.  Treat with IM Decadron, allergy regimen, supportive over-the-counter medications and home care.  Return for worsening symptoms.  Final Clinical Impressions(s) / UC Diagnoses   Final diagnoses:  Acute pharyngitis, unspecified etiology  Acute cough     Discharge Instructions      Your strep test was negative today.  Your symptoms may be a inflammatory from irritation from dust particles in the attic or it could be viral in nature.  We have sent out a throat culture to ensure no other strains of bacterial infection causing your sore throat.  We have also given you a steroid shot to help with the inflammation in your throat and your cough and you may take over-the-counter allergy medication, Coricidin HBP, throat lozenges and throat sprays    ED Prescriptions   None    PDMP not reviewed this encounter.   Volney American, [place] 05/01/22 1646

## 2022-05-01 NOTE — ED Triage Notes (Signed)
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 He had a cough and it hard for him to swallow.

## 2022-05-01 NOTE — Discharge Instructions (Signed)
Your strep test was negative today.  Your symptoms may be a inflammatory from irritation from dust particles in the attic or it could be viral in nature.  We have sent out a throat culture to ensure no other strains of bacterial infection causing your sore throat.  We have also given you a steroid shot to help with the inflammation in your throat and your cough and you may take over-the-counter allergy medication, Coricidin HBP, throat lozenges and throat sprays

## 2022-05-03 LAB — CULTURE, GROUP A STREP (THRC)

## 2022-05-19 DIAGNOSIS — D225 Melanocytic nevi of trunk: Secondary | ICD-10-CM | POA: Diagnosis not present

## 2022-05-19 DIAGNOSIS — L821 Other seborrheic keratosis: Secondary | ICD-10-CM | POA: Diagnosis not present

## 2022-05-19 DIAGNOSIS — C44622 Squamous cell carcinoma of skin of right upper limb, including shoulder: Secondary | ICD-10-CM | POA: Diagnosis not present

## 2022-06-16 DIAGNOSIS — Z08 Encounter for follow-up examination after completed treatment for malignant neoplasm: Secondary | ICD-10-CM | POA: Diagnosis not present

## 2022-06-16 DIAGNOSIS — Z85828 Personal history of other malignant neoplasm of skin: Secondary | ICD-10-CM | POA: Diagnosis not present

## 2022-07-26 DIAGNOSIS — Z961 Presence of intraocular lens: Secondary | ICD-10-CM | POA: Diagnosis not present

## 2022-07-26 DIAGNOSIS — H524 Presbyopia: Secondary | ICD-10-CM | POA: Diagnosis not present

## 2022-08-01 ENCOUNTER — Ambulatory Visit
Admission: EM | Admit: 2022-08-01 | Discharge: 2022-08-01 | Disposition: A | Discharge status: Home / Self Care | Payer: Medicare HMO | Attending: Nurse Practitioner | Admitting: Nurse Practitioner

## 2022-08-01 DIAGNOSIS — L02414 Cutaneous abscess of left upper limb: Secondary | ICD-10-CM | POA: Diagnosis not present

## 2022-08-01 MED ORDER — DOXYCYCLINE HYCLATE 100 MG PO CAPS: 100.0000 mg | ORAL_CAPSULE | Freq: Two times a day (BID) | ORAL | 0 refills | Status: AC

## 2022-08-01 MED ORDER — DOXYCYCLINE HYCLATE 100 MG PO CAPS: 100.0000 mg | ORAL_CAPSULE | Freq: Two times a day (BID) | ORAL | 0 refills | Status: DC

## 2022-08-01 NOTE — ED Provider Notes (Signed)
[facility]    CSN: [id number] Arrival date & time: 08/01/22  1809      History   Chief Complaint No chief complaint on file.   HPI George Cooper is a 67 y.o. male.   Patient presents today for red and swollen area to his left forearm that he noticed 2 days ago.  Reports when he woke up this morning, the area had come to ahead and he nicked it on something and it began draining a lot of pus.  He reports the redness has continued to spread over the day over his forearm.  Reports the area is tender to touch and warm.  Patient denies fever, nausea/vomiting.  No change in behavior or change in appetite.  Patient reports history of Lyme disease for which he took doxycycline in the past, otherwise denies recent antibiotic use.    Past Medical History:  Diagnosis Date   Arthritis    "right knee" (09/18/2012)   Carotid artery disease (HCC)    Claudication (HCC)    a. 11/09/15 Hawk atherectomy of SFA   Dizziness    History of gout    Hyperlipidemia    Lyme disease 2006   "after bit by a tick" (09/18/2012)   Renal artery stenosis (HCC)    Tobacco abuse    discontinued October 2014    Patient Active Problem List   Diagnosis Date Noted   Claudication in peripheral vascular disease (HCC) 06/17/2019   Essential hypertension 02/26/2019   CVA (cerebral infarction) 11/16/2015   Left-sided weakness 11/16/2015   Headache 11/16/2015   Hyperlipidemia 10/10/2012   Pain of right thigh 09/21/2012   PAD (peripheral artery disease), s/p athrectomy, PTA/ stent Rt. SFA  09/18/2012 09/19/2012   Carotid artery disease (HCC) 08/01/2012   Dizziness 08/01/2012   Claudication [facility])     Past Surgical History:  Procedure Laterality Date   ABDOMINAL ANGIOGRAM  09/18/2012   Procedure: ABDOMINAL ANGIOGRAM;  Surgeon: Runell Gess, MD;  Location: [facility] CATH LAB;  Service: Cardiovascular;;   ABDOMINAL AORTOGRAM W/LOWER EXTREMITY N/A 06/17/2019   Procedure: ABDOMINAL AORTOGRAM W/LOWER  EXTREMITY;  Surgeon: Runell Gess, MD;  Location: [facility] INVASIVE CV LAB;  Service: Cardiovascular;  Laterality: N/A;   ATHERECTOMY Right 09/18/2012   Procedure: ATHERECTOMY;  Surgeon: Runell Gess, MD;  Location: [facility] CATH LAB;  Service: Cardiovascular;  Laterality: Right;  right SFA   KNEE ARTHROSCOPY Right 1990's   "twice" (09/18/2012)   KNEE ARTHROSCOPY Left 1980's   LOWER EXTREMITY ANGIOGRAM N/A 09/18/2012   Procedure: LOWER EXTREMITY ANGIOGRAM;  Surgeon: Runell Gess, MD;  Location: [facility] CATH LAB;  Service: Cardiovascular;  Laterality: N/A;   PERCUTANEOUS STENT INTERVENTION  09/18/2012   Procedure: PERCUTANEOUS STENT INTERVENTION;  Surgeon: Runell Gess, MD;  Location: [facility] CATH LAB;  Service: Cardiovascular;;  right SFA   PERIPHERAL ARTERIAL STENT GRAFT Right 09/18/2012   "PTA & stenting right SFA"/notes 09/18/2012   PERIPHERAL VASCULAR CATHETERIZATION N/A 11/09/2015   Procedure: Abdominal Aortogram w/Lower Extremity;  Surgeon: Runell Gess, MD;  Location: [facility] INVASIVE CV LAB;  Service: Cardiovascular;  Laterality: N/A;   PERIPHERAL VASCULAR CATHETERIZATION Right 11/09/2015   Procedure: Peripheral Vascular Balloon Angioplasty;  Surgeon: Runell Gess, MD;  Location: [facility] INVASIVE CV LAB;  Service: Cardiovascular;  Laterality: Right;  SFA   PERIPHERAL VASCULAR CATHETERIZATION Right 11/09/2015   Procedure: Peripheral Vascular Atherectomy;  Surgeon: Runell Gess, MD;  Location: [facility] INVASIVE CV LAB;  Service: Cardiovascular;  Laterality: Right;  SFA   PERIPHERAL VASCULAR INTERVENTION Right 06/17/2019   Procedure: PERIPHERAL VASCULAR INTERVENTION;  Surgeon: Runell Gess, MD;  Location: [facility] INVASIVE CV LAB;  Service: Cardiovascular;  Laterality: Right;  superficial femoral       Home Medications    Prior to Admission medications   Medication Sig Start Date End Date Taking? Authorizing Provider  amLODipine (NORVASC) 10 MG tablet TAKE (1) TABLET BY MOUTH ONCE DAILY. 04/25/22    Runell Gess, MD  aspirin EC 81 MG tablet Take 1 tablet (81 mg total) by mouth daily. 01/09/18   Runell Gess, MD  clopidogrel (PLAVIX) 75 MG tablet TAKE 1 TABLET WITH BREAKFAST. 03/09/22   Runell Gess, MD  doxycycline (VIBRAMYCIN) 100 MG capsule Take 1 capsule (100 mg total) by mouth 2 (two) times daily for 7 days. 08/01/22 08/08/22  Valentino Nose, NP  Evolocumab (REPATHA SURECLICK) 140 MG/ML SOAJ Inject 140 mg into the skin every 14 (fourteen) days. 03/09/22   Antoine Poche, MD  naproxen sodium (ANAPROX) 220 MG tablet Take 220 mg by mouth daily as needed.    [provider]  ondansetron (ZOFRAN ODT) 4 MG disintegrating tablet Take 1 tablet (4 mg total) by mouth every 8 (eight) hours as needed for nausea or vomiting. 09/16/20   Mannie Stabile, PA-C  pravastatin (PRAVACHOL) 20 MG tablet Take 20 mg by mouth daily. 11/29/21   [provider]  sildenafil (VIAGRA) 100 MG tablet Take 100 mg by mouth daily. 12/02/21   [provider]    Family History Family History  Problem Relation Age of Onset   Heart Problems Mother        CABG, PCI   Stroke Father    Heart failure Maternal Grandmother    Heart attack Maternal Grandfather    Heart failure Paternal Grandmother    Heart attack Paternal Grandfather    Stroke Paternal Grandfather    Diabetes Paternal Grandfather    Heart Problems Sister        PCI    Social History Social History   Tobacco Use   Smoking status: Former    Packs/day: 0.20    Years: 40.00    Additional pack years: 0.00    Total pack years: 8.00    Types: Cigarettes    Quit date: 11/29/2012    Years since quitting: 9.6   Smokeless tobacco: Never   Tobacco comments:    trying to quit, smokes 2-3 daily  Substance Use Topics   Alcohol use: No    Alcohol/week: 0.0 standard drinks of alcohol   Drug use: Yes    Types: Marijuana    Comment: "every once in a while"     Allergies   Patient has no known  allergies.   Review of Systems Review of Systems Per HPI  Physical Exam Triage Vital Signs ED Triage Vitals  Enc Vitals Group     BP 08/01/22 1840 (!) 161/74     Pulse Rate 08/01/22 1840 61     Resp 08/01/22 1840 18     Temp 08/01/22 1840 98 F (36.7 C)     Temp Source 08/01/22 1840 Oral     SpO2 08/01/22 1840 95 %     Weight --      Height --      Head Circumference --      Peak Flow --      Pain Score 08/01/22 1839 8     Pain Loc --  Pain Edu? --      Excl. in GC? --    No data found.  Updated Vital Signs BP (!) 161/74 (BP Location: Right Arm)   Pulse 61   Temp 98 F (36.7 C) (Oral)   Resp 18   SpO2 95%   Visual Acuity Right Eye Distance:   Left Eye Distance:   Bilateral Distance:    Right Eye Near:   Left Eye Near:    Bilateral Near:     Physical Exam Vitals and nursing note reviewed.  Constitutional:      General: He is not in acute distress.    Appearance: Normal appearance. He is not toxic-appearing.  HENT:     Head: Normocephalic and atraumatic.     Mouth/Throat:     Mouth: Mucous membranes are moist.  Pulmonary:     Effort: Pulmonary effort is normal. No respiratory distress.  Skin:    General: Skin is warm and dry.     Capillary Refill: Capillary refill takes less than 2 seconds.     Coloration: Skin is not jaundiced or pale.     Findings: Abscess present. No erythema or rash.     Comments: Draining abscess to left forearm approximately 0.5 x 0.5 cm.  There is surrounding erythema and warmth.  No erythema streaking up the arm.  No swelling or fluctuance.  Hand is neurovascularly intact distal to the wound.  Neurological:     Mental Status: He is alert and oriented to person, place, and time.  Psychiatric:        Behavior: Behavior is cooperative.      UC Treatments / Results  Labs (all labs ordered are listed, but only abnormal results are displayed) Labs Reviewed - No data to display  EKG   Radiology No results  found.  Procedures Procedures (including critical care time)  Medications Ordered in UC Medications - No data to display  Initial Impression / Assessment and Plan / UC Course  I have reviewed the triage vital signs and the nursing notes.  Pertinent labs & imaging results that were available during my care of the patient were reviewed by me and considered in my medical decision making (see chart for details).   Patient is well-appearing, afebrile, not tachycardic, not tachypneic, oxygenating well on room air.  Patient is mildly hypertensive today in urgent care.  1. Abscess of left forearm Treat with doxycycline twice daily for 7 days I&D not indicated today-wound is already draining on its own and I do not appreciate fluctuance on exam today Start warm compresses Seek care for persistent/worsening symptoms despite treatment  The patient was given the opportunity to ask questions.  All questions answered to their satisfaction.  The patient is in agreement to this plan.    Final Clinical Impressions(s) / UC Diagnoses   Final diagnoses:  Abscess of left forearm     Discharge Instructions      Take the doxycycline as prescribed to treat the skin infection.  Start warm compresses to the area.  Seek care if symptoms persist or worsen despite treatment.    ED Prescriptions     Medication Sig Dispense Auth. Provider   doxycycline (VIBRAMYCIN) 100 MG capsule  (Status: Discontinued) Take 1 capsule (100 mg total) by mouth 2 (two) times daily for 7 days. 14 capsule Cathlean Marseilles A, NP   doxycycline (VIBRAMYCIN) 100 MG capsule Take 1 capsule (100 mg total) by mouth 2 (two) times daily for 7 days. 14 capsule  Valentino Nose, NP      PDMP not reviewed this encounter.   Valentino Nose, NP 08/01/22 1924

## 2022-08-01 NOTE — ED Triage Notes (Signed)
Pt reports he thinks he has gotten bit by a spider x 2 days. He touched it today and it opened up. The area is also swollen

## 2022-08-01 NOTE — Discharge Instructions (Addendum)
Take the doxycycline as prescribed to treat the skin infection.  Start warm compresses to the area.  Seek care if symptoms persist or worsen despite treatment.

## 2022-09-16 ENCOUNTER — Other Ambulatory Visit (HOSPITAL_COMMUNITY): Payer: Self-pay | Admitting: Internal Medicine

## 2022-09-16 ENCOUNTER — Ambulatory Visit (HOSPITAL_COMMUNITY)
Admission: RE | Admit: 2022-09-16 | Discharge: 2022-09-16 | Disposition: A | Discharge status: Home / Self Care | Payer: Medicare HMO | Source: Ambulatory Visit | Attending: Internal Medicine | Admitting: Internal Medicine

## 2022-09-16 DIAGNOSIS — M5412 Radiculopathy, cervical region: Secondary | ICD-10-CM

## 2022-09-16 DIAGNOSIS — M5010 Cervical disc disorder with radiculopathy, unspecified cervical region: Secondary | ICD-10-CM | POA: Diagnosis not present

## 2022-09-16 DIAGNOSIS — G5600 Carpal tunnel syndrome, unspecified upper limb: Secondary | ICD-10-CM | POA: Diagnosis not present

## 2022-09-16 DIAGNOSIS — Z6825 Body mass index (BMI) 25.0-25.9, adult: Secondary | ICD-10-CM | POA: Diagnosis not present

## 2022-09-28 ENCOUNTER — Ambulatory Visit (HOSPITAL_COMMUNITY)
Admission: RE | Admit: 2022-09-28 | Discharge: 2022-09-28 | Disposition: A | Discharge status: Home / Self Care | Payer: Medicare HMO | Source: Ambulatory Visit | Attending: Cardiology | Admitting: Cardiology

## 2022-09-28 DIAGNOSIS — I6523 Occlusion and stenosis of bilateral carotid arteries: Secondary | ICD-10-CM | POA: Diagnosis not present

## 2022-09-30 ENCOUNTER — Other Ambulatory Visit (HOSPITAL_COMMUNITY): Payer: Self-pay | Admitting: *Deleted

## 2022-09-30 ENCOUNTER — Encounter: Payer: Self-pay | Admitting: Cardiovascular Disease

## 2022-09-30 DIAGNOSIS — I6523 Occlusion and stenosis of bilateral carotid arteries: Secondary | ICD-10-CM

## 2022-10-26 ENCOUNTER — Encounter: Payer: Self-pay | Admitting: Neurology

## 2022-10-26 ENCOUNTER — Telehealth: Payer: Self-pay | Admitting: Neurology

## 2022-10-26 NOTE — Telephone Encounter (Signed)
LVM and sent letter in mail informing pt of need to reschedule 11/28/22 appt - MD out

## 2022-11-07 ENCOUNTER — Ambulatory Visit
Admission: EM | Admit: 2022-11-07 | Discharge: 2022-11-07 | Disposition: A | Discharge status: Home / Self Care | Payer: Medicare HMO | Attending: Family Medicine | Admitting: Family Medicine

## 2022-11-07 DIAGNOSIS — S46912A Strain of unspecified muscle, fascia and tendon at shoulder and upper arm level, left arm, initial encounter: Secondary | ICD-10-CM | POA: Diagnosis not present

## 2022-11-07 MED ORDER — PREDNISONE 20 MG PO TABS: 40.0000 mg | ORAL_TABLET | Freq: Every day | ORAL | 0 refills | Status: DC

## 2022-11-07 MED ORDER — TIZANIDINE HCL 2 MG PO CAPS: 2.0000 mg | ORAL_CAPSULE | Freq: Three times a day (TID) | ORAL | 0 refills | Status: DC | PRN

## 2022-11-07 NOTE — ED Provider Notes (Signed)
[facility]    CSN: [id number] Arrival date & time: 11/07/22  1123      History   Chief Complaint Chief Complaint  Patient presents with   Shoulder Pain    HPI George Cooper is a 67 y.o. male.   Patient presenting today with about a week of left upper arm and shoulder pain that worsened yesterday.  Initially was moving a bunch of heavy boards when the pain first started.  Denies new numbness or tingling of the arm though does have chronic radicular pain which is already being evaluated otherwise.  Denies loss of range of motion though painful to move in certain directions, and no edema, discoloration, new neck pain, or other new symptoms.  Trying over-the-counter remedies such as Tylenol with minimal relief.    Past Medical History:  Diagnosis Date   Arthritis    "right knee" (09/18/2012)   Carotid artery disease (HCC)    Claudication (HCC)    a. 11/09/15 Hawk atherectomy of SFA   Dizziness    History of gout    Hyperlipidemia    Lyme disease 2006   "after bit by a tick" (09/18/2012)   Renal artery stenosis (HCC)    Tobacco abuse    discontinued October 2014    Patient Active Problem List   Diagnosis Date Noted   Claudication in peripheral vascular disease (HCC) 06/17/2019   Essential hypertension 02/26/2019   CVA (cerebral infarction) 11/16/2015   Left-sided weakness 11/16/2015   Headache 11/16/2015   Hyperlipidemia 10/10/2012   Pain of right thigh 09/21/2012   PAD (peripheral artery disease), s/p athrectomy, PTA/ stent Rt. SFA  09/18/2012 09/19/2012   Carotid artery disease (HCC) 08/01/2012   Dizziness 08/01/2012   Claudication [facility])     Past Surgical History:  Procedure Laterality Date   ABDOMINAL ANGIOGRAM  09/18/2012   Procedure: ABDOMINAL ANGIOGRAM;  Surgeon: Runell Gess, MD;  Location: [facility] CATH LAB;  Service: Cardiovascular;;   ABDOMINAL AORTOGRAM W/LOWER EXTREMITY N/A 06/17/2019   Procedure: ABDOMINAL AORTOGRAM W/LOWER EXTREMITY;   Surgeon: Runell Gess, MD;  Location: [facility] INVASIVE CV LAB;  Service: Cardiovascular;  Laterality: N/A;   ATHERECTOMY Right 09/18/2012   Procedure: ATHERECTOMY;  Surgeon: Runell Gess, MD;  Location: [facility] CATH LAB;  Service: Cardiovascular;  Laterality: Right;  right SFA   KNEE ARTHROSCOPY Right 1990's   "twice" (09/18/2012)   KNEE ARTHROSCOPY Left 1980's   LOWER EXTREMITY ANGIOGRAM N/A 09/18/2012   Procedure: LOWER EXTREMITY ANGIOGRAM;  Surgeon: Runell Gess, MD;  Location: [facility] CATH LAB;  Service: Cardiovascular;  Laterality: N/A;   PERCUTANEOUS STENT INTERVENTION  09/18/2012   Procedure: PERCUTANEOUS STENT INTERVENTION;  Surgeon: Runell Gess, MD;  Location: [facility] CATH LAB;  Service: Cardiovascular;;  right SFA   PERIPHERAL ARTERIAL STENT GRAFT Right 09/18/2012   "PTA & stenting right SFA"/notes 09/18/2012   PERIPHERAL VASCULAR CATHETERIZATION N/A 11/09/2015   Procedure: Abdominal Aortogram w/Lower Extremity;  Surgeon: Runell Gess, MD;  Location: [facility] INVASIVE CV LAB;  Service: Cardiovascular;  Laterality: N/A;   PERIPHERAL VASCULAR CATHETERIZATION Right 11/09/2015   Procedure: Peripheral Vascular Balloon Angioplasty;  Surgeon: Runell Gess, MD;  Location: [facility] INVASIVE CV LAB;  Service: Cardiovascular;  Laterality: Right;  SFA   PERIPHERAL VASCULAR CATHETERIZATION Right 11/09/2015   Procedure: Peripheral Vascular Atherectomy;  Surgeon: Runell Gess, MD;  Location: [facility] INVASIVE CV LAB;  Service: Cardiovascular;  Laterality: Right;  SFA   PERIPHERAL VASCULAR INTERVENTION Right 06/17/2019   Procedure:  PERIPHERAL VASCULAR INTERVENTION;  Surgeon: Runell Gess, MD;  Location: [facility] INVASIVE CV LAB;  Service: Cardiovascular;  Laterality: Right;  superficial femoral       Home Medications    Prior to Admission medications   Medication Sig Start Date End Date Taking? Authorizing Provider  amLODipine (NORVASC) 10 MG tablet TAKE (1) TABLET BY MOUTH ONCE DAILY. 04/25/22  Yes Runell Gess, MD  aspirin EC 81 MG tablet Take 1 tablet (81 mg total) by mouth daily. 01/09/18  Yes Runell Gess, MD  clopidogrel (PLAVIX) 75 MG tablet TAKE 1 TABLET WITH BREAKFAST. 03/09/22  Yes Runell Gess, MD  Evolocumab (REPATHA SURECLICK) 140 MG/ML SOAJ Inject 140 mg into the skin every 14 (fourteen) days. 03/09/22  Yes BranchDorothe Pea, MD  pravastatin (PRAVACHOL) 20 MG tablet Take 20 mg by mouth daily. 11/29/21  Yes [provider]  predniSONE (DELTASONE) 20 MG tablet Take 2 tablets (40 mg total) by mouth daily with breakfast. 11/07/22  Yes Particia Nearing, PA-C  tizanidine (ZANAFLEX) 2 MG capsule Take 1 capsule (2 mg total) by mouth 3 (three) times daily as needed for muscle spasms. Do not drink alcohol or drive while taking this medication.  May cause drowsiness. 11/07/22  Yes Particia Nearing, PA-C  naproxen sodium (ANAPROX) 220 MG tablet Take 220 mg by mouth daily as needed.    [provider]  ondansetron (ZOFRAN ODT) 4 MG disintegrating tablet Take 1 tablet (4 mg total) by mouth every 8 (eight) hours as needed for nausea or vomiting. 09/16/20   Mannie Stabile, PA-C  sildenafil (VIAGRA) 100 MG tablet Take 100 mg by mouth daily. 12/02/21   [provider]    Family History Family History  Problem Relation Age of Onset   Heart Problems Mother        CABG, PCI   Stroke Father    Heart failure Maternal Grandmother    Heart attack Maternal Grandfather    Heart failure Paternal Grandmother    Heart attack Paternal Grandfather    Stroke Paternal Grandfather    Diabetes Paternal Grandfather    Heart Problems Sister        PCI    Social History Social History   Tobacco Use   Smoking status: Former    Current packs/day: 0.00    Average packs/day: 0.2 packs/day for 40.0 years (8.0 ttl pk-yrs)    Types: Cigarettes    Start date: 11/29/1972    Quit date: 11/29/2012    Years since quitting: 9.9   Smokeless tobacco: Never   Tobacco  comments:    trying to quit, smokes 2-3 daily  Substance Use Topics   Alcohol use: No    Alcohol/week: 0.0 standard drinks of alcohol   Drug use: Yes    Types: Marijuana    Comment: "every once in a while"     Allergies   Patient has no known allergies.   Review of Systems Review of Systems Per HPI  Physical Exam Triage Vital Signs ED Triage Vitals  Encounter Vitals Group     BP 11/07/22 1155 (!) 154/65     Systolic BP Percentile --      Diastolic BP Percentile --      Pulse Rate 11/07/22 1155 (!) 59     Resp 11/07/22 1155 18     Temp 11/07/22 1155 98.1 F (36.7 C)     Temp Source 11/07/22 1155 Oral     SpO2 11/07/22 1155  95 %     Weight --      Height --      Head Circumference --      Peak Flow --      Pain Score 11/07/22 1156 8     Pain Loc --      Pain Education --      Exclude from Growth Chart --    No data found.  Updated Vital Signs BP (!) 154/65 (BP Location: Right Arm)   Pulse (!) 59   Temp 98.1 F (36.7 C) (Oral)   Resp 18   SpO2 95%   Visual Acuity Right Eye Distance:   Left Eye Distance:   Bilateral Distance:    Right Eye Near:   Left Eye Near:    Bilateral Near:     Physical Exam Vitals and nursing note reviewed.  Constitutional:      Appearance: Normal appearance.  HENT:     Head: Atraumatic.  Eyes:     Extraocular Movements: Extraocular movements intact.     Conjunctiva/sclera: Conjunctivae normal.  Cardiovascular:     Rate and Rhythm: Normal rate and regular rhythm.  Pulmonary:     Effort: Pulmonary effort is normal.     Breath sounds: Normal breath sounds.  Musculoskeletal:        General: Tenderness present. No swelling or deformity. Normal range of motion.     Cervical back: Normal range of motion and neck supple.     Comments: No midline spinal tenderness to palpation diffusely.  Tender to palpation in the left trapezius, deltoid.  No bony deformities palpable and range of motion intact  Skin:    General: Skin is warm  and dry.     Findings: No bruising or erythema.  Neurological:     General: No focal deficit present.     Mental Status: He is oriented to person, place, and time.     Comments: Left upper extremity neurovascularly intact  Psychiatric:        Mood and Affect: Mood normal.        Thought Content: Thought content normal.        Judgment: Judgment normal.    UC Treatments / Results  Labs (all labs ordered are listed, but only abnormal results are displayed) Labs Reviewed - No data to display  EKG   Radiology No results found.  Procedures Procedures (including critical care time)  Medications Ordered in UC Medications - No data to display  Initial Impression / Assessment and Plan / UC Course  I have reviewed the triage vital signs and the nursing notes.  Pertinent labs & imaging results that were available during my care of the patient were reviewed by me and considered in my medical decision making (see chart for details).     Vital signs and exam overall reassuring today, consistent with muscular strain.  Treat with prednisone, Zanaflex, supportive over-the-counter medications and home care.  Return for worsening symptoms.  Final Clinical Impressions(s) / UC Diagnoses   Final diagnoses:  Strain of left shoulder, initial encounter   Discharge Instructions   None    ED Prescriptions     Medication Sig Dispense Auth. Provider   predniSONE (DELTASONE) 20 MG tablet Take 2 tablets (40 mg total) by mouth daily with breakfast. 10 tablet Particia Nearing, PA-C   tizanidine (ZANAFLEX) 2 MG capsule Take 1 capsule (2 mg total) by mouth 3 (three) times daily as needed for muscle spasms. Do not drink alcohol  or drive while taking this medication.  May cause drowsiness. 15 capsule Particia Nearing, [place]      PDMP not reviewed this encounter.   Particia Nearing, [place] 11/07/22 1320

## 2022-11-07 NOTE — ED Triage Notes (Signed)
Pt states he was moving boards at home a week ago and felt a pull on left shoulder/upper arm, felt fine for the past week then last night he moved and the pain came back and having difficulty moving left arm up, with a constant sharp pain in upper left arm that goes to shoulder.

## 2022-11-09 ENCOUNTER — Ambulatory Visit: Payer: Medicare HMO | Admitting: Diagnostic Neuroimaging

## 2022-11-09 ENCOUNTER — Encounter: Payer: Self-pay | Admitting: Diagnostic Neuroimaging

## 2022-11-09 ENCOUNTER — Telehealth: Payer: Self-pay | Admitting: Diagnostic Neuroimaging

## 2022-11-09 VITALS — BP 121/80 | HR 60 | Ht 67.0 in | Wt 172.0 lb

## 2022-11-09 DIAGNOSIS — R200 Anesthesia of skin: Secondary | ICD-10-CM

## 2022-11-09 DIAGNOSIS — M542 Cervicalgia: Secondary | ICD-10-CM

## 2022-11-09 DIAGNOSIS — M5412 Radiculopathy, cervical region: Secondary | ICD-10-CM | POA: Diagnosis not present

## 2022-11-09 MED ORDER — GABAPENTIN 300 MG PO CAPS: 300.0000 mg | ORAL_CAPSULE | Freq: Every day | ORAL | 3 refills | Status: DC

## 2022-11-09 NOTE — Patient Instructions (Signed)
NECK PAIN, BILATERAL ARM PAIN, NUMBNESS (right > left) - check MRI cervical spine (eval for cervical radiculopathies) - check EMG/NCS (eval for carpal tunnel syndrome) - gabapentin 300mg  at bedtime - use wrist splint at bedtime

## 2022-11-09 NOTE — Progress Notes (Signed)
[facility]  PATIENT: George Cooper DOB: 1955/02/27  REFERRING CLINICIAN: Elfredia Nevins, MD HISTORY FROM: patient REASON FOR VISIT: new consult   HISTORICAL  CHIEF COMPLAINT:  Chief Complaint  Patient presents with   New Patient (Initial Visit)    Patient in room #6 with his wife.  Patient states he has pain and numbness in his neck, fingers and hands. Patient states it hard for him to hold a fork and the numbness keeps him up at night.    HISTORY OF PRESENT ILLNESS:   67 year old male here for evaluation of numbness and tingling.  Symptoms started about 4 months ago with right greater than left hand numbness and tingling.  Third digit is mainly affected.  Digits 1 through 4 are slightly affected.  Sometimes turning his head to the right triggers numbness in his right hand.  He also has some arthritis and stiffness in bilateral hands.  No problems with feet or legs.   REVIEW OF SYSTEMS: Full 14 system review of systems performed and negative with exception of: as per HPI.  ALLERGIES: No Known Allergies  HOME MEDICATIONS: Outpatient Medications Prior to Visit  Medication Sig Dispense Refill   amLODipine (NORVASC) 10 MG tablet TAKE (1) TABLET BY MOUTH ONCE DAILY. 90 tablet 2   aspirin EC 81 MG tablet Take 1 tablet (81 mg total) by mouth daily. 90 tablet 3   clopidogrel (PLAVIX) 75 MG tablet TAKE 1 TABLET WITH BREAKFAST. 90 tablet 3   Evolocumab (REPATHA SURECLICK) 140 MG/ML SOAJ Inject 140 mg into the skin every 14 (fourteen) days. 2 mL 11   methylPREDNISolone (MEDROL DOSEPAK) 4 MG TBPK tablet Take 4 mg by mouth See admin instructions.     naproxen sodium (ANAPROX) 220 MG tablet Take 220 mg by mouth daily as needed.     ondansetron (ZOFRAN ODT) 4 MG disintegrating tablet Take 1 tablet (4 mg total) by mouth every 8 (eight) hours as needed for nausea or vomiting. 20 tablet 0   pravastatin (PRAVACHOL) 20 MG tablet Take 20 mg by mouth daily.     predniSONE  (DELTASONE) 20 MG tablet Take 2 tablets (40 mg total) by mouth daily with breakfast. 10 tablet 0   tadalafil (CIALIS) 5 MG tablet Take 5 mg by mouth daily as needed.     tizanidine (ZANAFLEX) 2 MG capsule Take 1 capsule (2 mg total) by mouth 3 (three) times daily as needed for muscle spasms. Do not drink alcohol or drive while taking this medication.  May cause drowsiness. 15 capsule 0   tiZANidine (ZANAFLEX) 2 MG tablet Take 2 mg by mouth 3 (three) times daily as needed.     sildenafil (VIAGRA) 100 MG tablet Take 100 mg by mouth daily. (Patient not taking: Reported on 11/09/2022)     No facility-administered medications prior to visit.    PAST MEDICAL HISTORY: Past Medical History:  Diagnosis Date   Arthritis    "right knee" (09/18/2012)   Carotid artery disease (HCC)    Claudication (HCC)    a. 11/09/15 Hawk atherectomy of SFA   Dizziness    History of gout    Hyperlipidemia    Lyme disease 2006   "after bit by a tick" (09/18/2012)   Renal artery stenosis (HCC)    Tobacco abuse    discontinued October 2014    PAST SURGICAL HISTORY: Past Surgical History:  Procedure Laterality Date   ABDOMINAL ANGIOGRAM  09/18/2012   Procedure: ABDOMINAL ANGIOGRAM;  Surgeon: Delton See  Allyson Sabal, MD;  Location: [facility] CATH LAB;  Service: Cardiovascular;;   ABDOMINAL AORTOGRAM W/LOWER EXTREMITY N/A 06/17/2019   Procedure: ABDOMINAL AORTOGRAM W/LOWER EXTREMITY;  Surgeon: Runell Gess, MD;  Location: [facility] INVASIVE CV LAB;  Service: Cardiovascular;  Laterality: N/A;   ATHERECTOMY Right 09/18/2012   Procedure: ATHERECTOMY;  Surgeon: Runell Gess, MD;  Location: [facility] CATH LAB;  Service: Cardiovascular;  Laterality: Right;  right SFA   KNEE ARTHROSCOPY Right 1990's   "twice" (09/18/2012)   KNEE ARTHROSCOPY Left 1980's   LOWER EXTREMITY ANGIOGRAM N/A 09/18/2012   Procedure: LOWER EXTREMITY ANGIOGRAM;  Surgeon: Runell Gess, MD;  Location: [facility] CATH LAB;  Service: Cardiovascular;  Laterality: N/A;    PERCUTANEOUS STENT INTERVENTION  09/18/2012   Procedure: PERCUTANEOUS STENT INTERVENTION;  Surgeon: Runell Gess, MD;  Location: [facility] CATH LAB;  Service: Cardiovascular;;  right SFA   PERIPHERAL ARTERIAL STENT GRAFT Right 09/18/2012   "PTA & stenting right SFA"/notes 09/18/2012   PERIPHERAL VASCULAR CATHETERIZATION N/A 11/09/2015   Procedure: Abdominal Aortogram w/Lower Extremity;  Surgeon: Runell Gess, MD;  Location: [facility] INVASIVE CV LAB;  Service: Cardiovascular;  Laterality: N/A;   PERIPHERAL VASCULAR CATHETERIZATION Right 11/09/2015   Procedure: Peripheral Vascular Balloon Angioplasty;  Surgeon: Runell Gess, MD;  Location: [facility] INVASIVE CV LAB;  Service: Cardiovascular;  Laterality: Right;  SFA   PERIPHERAL VASCULAR CATHETERIZATION Right 11/09/2015   Procedure: Peripheral Vascular Atherectomy;  Surgeon: Runell Gess, MD;  Location: [facility] INVASIVE CV LAB;  Service: Cardiovascular;  Laterality: Right;  SFA   PERIPHERAL VASCULAR INTERVENTION Right 06/17/2019   Procedure: PERIPHERAL VASCULAR INTERVENTION;  Surgeon: Runell Gess, MD;  Location: [facility] INVASIVE CV LAB;  Service: Cardiovascular;  Laterality: Right;  superficial femoral    FAMILY HISTORY: Family History  Problem Relation Age of Onset   Heart Problems Mother        CABG, PCI   Stroke Father    Heart failure Maternal Grandmother    Heart attack Maternal Grandfather    Heart failure Paternal Grandmother    Heart attack Paternal Grandfather    Stroke Paternal Grandfather    Diabetes Paternal Grandfather    Heart Problems Sister        PCI    SOCIAL HISTORY: Social History   Socioeconomic History   Marital status: Married    Spouse name: Not on file   Number of children: Not on file   Years of education: Not on file   Highest education level: Not on file  Occupational History   Not on file  Tobacco Use   Smoking status: Former    Current packs/day: 0.00    Average packs/day: 0.2 packs/day for 40.0 years (8.0 ttl  pk-yrs)    Types: Cigarettes    Start date: 11/29/1972    Quit date: 11/29/2012    Years since quitting: 9.9   Smokeless tobacco: Never   Tobacco comments:    trying to quit, smokes 2-3 daily  Substance and Sexual Activity   Alcohol use: No    Alcohol/week: 0.0 standard drinks of alcohol   Drug use: Yes    Types: Marijuana    Comment: "every once in a while"   Sexual activity: Not on file  Other Topics Concern   Not on file  Social History Narrative   Not on file   Social Determinants of Health   Financial Resource Strain: Not on file  Food Insecurity: Not on file  Transportation Needs: Not on file  Physical  Activity: Not on file  Stress: Not on file  Social Connections: Not on file  Intimate Partner Violence: Not on file     PHYSICAL EXAM  GENERAL EXAM/CONSTITUTIONAL: Vitals:  Vitals:   11/09/22 0913  BP: 121/80  Pulse: 60  Weight: 172 lb (78 kg)  Height: 5\' 7"  (1.702 m)   Body mass index is 26.94 kg/m. Wt Readings from Last 3 Encounters:  11/09/22 172 lb (78 kg)  12/23/21 159 lb 6.4 oz (72.3 kg)  09/16/20 165 lb (74.8 kg)   Patient is in no distress; well developed, nourished and groomed; NECK ROM TENDER; HEAD TURN RIGHT TRIGGERS RIGHT HAND PAIN  CARDIOVASCULAR: Examination of carotid arteries is normal; no carotid bruits Regular rate and rhythm, no murmurs Examination of peripheral vascular system by observation and palpation is normal  EYES: Ophthalmoscopic exam of optic discs and posterior segments is normal; no papilledema or hemorrhages No results found.  MUSCULOSKELETAL: Gait, strength, tone, movements noted in Neurologic exam below  NEUROLOGIC: MENTAL STATUS:      No data to display         awake, alert, oriented to person, place and time recent and remote memory intact normal attention and concentration language fluent, comprehension intact, naming intact fund of knowledge appropriate  CRANIAL NERVE:  2nd - no papilledema on  fundoscopic exam 2nd, 3rd, 4th, 6th - pupils equal and reactive to light, visual fields full to confrontation, extraocular muscles intact, no nystagmus 5th - facial sensation symmetric 7th - facial strength symmetric 8th - hearing intact 9th - palate elevates symmetrically, uvula midline 11th - shoulder shrug symmetric 12th - tongue protrusion midline  MOTOR:  normal bulk and tone, full strength in the BUE, BLE; EXCEPT BILATERAL DELTOID 4, LEFT BICEPS 3 (LIMITED BY LEFT TRICEPS PAIN); LEFT APB 4+; FINGER ABDUCTION 4+; ARTHRITIC CHANGES OF BILATERAL HANDS  SENSORY:  normal and symmetric to light touch, pinprick, temperature, vibration; EXCEPT DECR IN RIGHT HAND DIGITS 1-4  COORDINATION:  finger-nose-finger, fine finger movements normal  REFLEXES:  deep tendon reflexes --> BUE TRACE; BLE ABSENT  GAIT/STATION:  narrow based gait     DIAGNOSTIC DATA (LABS, IMAGING, TESTING) - I reviewed patient records, labs, notes, testing and imaging myself where available.  Lab Results  Component Value Date   WBC 7.3 06/17/2019   HGB 14.4 06/17/2019   HCT 42.0 06/17/2019   MCV 84.5 06/17/2019   PLT 207 06/17/2019      Component Value Date/Time   NA 136 06/17/2019 0556   NA 138 11/06/2015 0733   K 3.3 (L) 06/17/2019 0556   CL 104 06/17/2019 0556   CO2 24 06/17/2019 0556   GLUCOSE 117 (H) 06/17/2019 0556   BUN 19 06/17/2019 0556   BUN 16 11/06/2015 0733   CREATININE 1.21 06/17/2019 0556   CREATININE 1.04 09/05/2012 1201   CALCIUM 8.5 (L) 06/17/2019 0556   PROT 6.7 03/04/2021 0819   ALBUMIN 4.6 03/04/2021 0819   AST 21 03/04/2021 0819   ALT 17 03/04/2021 0819   ALKPHOS 72 03/04/2021 0819   BILITOT 0.3 03/04/2021 0819   GFRNONAA >60 06/17/2019 0556   GFRAA >60 06/17/2019 0556   Lab Results  Component Value Date   CHOL 89 (L) 03/04/2021   HDL 45 03/04/2021   LDLCALC 29 03/04/2021   TRIG 68 03/04/2021   CHOLHDL 2.0 03/04/2021   No results found for: "HGBA1C" Lab Results   Component Value Date   VITAMINB12 239 11/17/2015   Lab Results  Component  Value Date   TSH 2.663 11/16/2015    09/16/22 Xray cervical spine  1. No acute fracture or malalignment. 2. Mild multilevel degenerative disc disease, most pronounced at C6-C7.    ASSESSMENT AND PLAN  67 y.o. year old male here with:   Dx:  1. Neck pain   2. Cervical radiculopathy   3. Hand numbness     PLAN:  NECK PAIN, BILATERAL ARM PAIN, NUMBNESS (right > left; since ~ May 2024; suspect cervical radiculopathies; may also have superimposed right carpal tunnel syndrome) - check MRI cervical spine (eval for cervical radiculopathies) - check EMG/NCS (eval for carpal tunnel syndrome) - gabapentin 300mg  at bedtime - use wrist splint at bedtime  Orders Placed This Encounter  Procedures   MR CERVICAL SPINE WO CONTRAST   NCV with EMG(electromyography)    Meds ordered this encounter  Medications   gabapentin (NEURONTIN) 300 MG capsule    Sig: Take 1 capsule (300 mg total) by mouth at bedtime.    Dispense:  30 capsule    Refill:  3    Return for for NCV/EMG.    Suanne Marker, MD 11/09/2022, 9:45 AM Certified in Neurology, Neurophysiology and Neuroimaging  [facility] [address] [place], [place] [address] [phone]

## 2022-11-09 NOTE — Telephone Encounter (Signed)
sent to GI they obtain [facility] Berkley Harvey [phone]

## 2022-11-15 ENCOUNTER — Encounter: Payer: Self-pay | Admitting: Diagnostic Neuroimaging

## 2022-11-15 ENCOUNTER — Ambulatory Visit: Payer: Medicare HMO | Admitting: Diagnostic Neuroimaging

## 2022-11-16 ENCOUNTER — Encounter: Payer: Self-pay | Admitting: Diagnostic Neuroimaging

## 2022-11-17 ENCOUNTER — Encounter: Payer: Self-pay | Admitting: Diagnostic Neuroimaging

## 2022-11-18 ENCOUNTER — Encounter: Payer: Self-pay | Admitting: Diagnostic Neuroimaging

## 2022-11-19 ENCOUNTER — Other Ambulatory Visit: Payer: Medicare HMO

## 2022-11-28 ENCOUNTER — Ambulatory Visit: Payer: Medicare HMO | Admitting: Neurology

## 2022-11-29 ENCOUNTER — Telehealth: Payer: Self-pay | Admitting: Diagnostic Neuroimaging

## 2022-11-29 DIAGNOSIS — R200 Anesthesia of skin: Secondary | ICD-10-CM

## 2022-11-29 DIAGNOSIS — M5412 Radiculopathy, cervical region: Secondary | ICD-10-CM

## 2022-11-29 DIAGNOSIS — M542 Cervicalgia: Secondary | ICD-10-CM

## 2022-11-29 NOTE — Telephone Encounter (Signed)
Pt reports that his insurance company informed him that the provider did not properly submit all needed documents as to why his MRI was not approved.  Pt is asking that all needed documents be submitted so that he can get the needed approval for the MRI

## 2022-11-29 NOTE — Telephone Encounter (Signed)
This is from GI who was handling the auth: For the following reason:   Why did we deny your request? We denied the medical services/items listed above because:  Your doctor told us that you have neck pain. We cannot approve this request because: Imaging requires six weeks of provider directed treatment to be completed. This must have been  completed in the past three months without improved symptoms. Contact (via office visit, phone,  email, or messaging) must occur after the treatment is completed. This has not been met because: The notes sent to [place] do not show you have completed a full six weeks of this type of treatment. Your treatment did not occur within the last three months. Symptoms must be the same or worse after treatment to support imaging. The notes sent to [place] do not show any contact with your provider after you completed your  treatment. This contact is needed to plan your future care.  This finding was based on review of Medicare National Coverage Determinations (NCD): 220.2  Form CMS 10003-NDMCP OMB Approval [phone] (Expires: 02/21/2023) Magnetic Resonance Imaging and [employer] Spine Imaging Guidelines Section(s): Neck (Cervical  Spine) Pain without and with Neurological Features (Including Stenosis) (SP 3.1) and 1.0  General Guidelines. You may access [employer] healthcare coverage policies at www.[employer].com, select Resources and  then Clinical Guidelines, or call [phone]. You should share a copy of this decision with your doctor so you and your doctor can discuss next steps. If your doctor requested coverage on your behalf, we have sent a copy of this decision  to your doctor. You have the right to appeal our decision You have the right to ask [facility] to review our decision by asking [place] for an appeal. Plan Appeal: Ask Monia [employer] for an appeal within 60 days of the date of this notice. We  can give you more time if you have a good reason for missing the  deadline. See section titled  "How to ask for an appeal with [facility]" for information on how to ask for a plan level  appeal. If you want someone else to act for you You can name a relative, friend, attorney, doctor, or someone else to act as your representative.  If you want someone else to act for you, call us at: [phone] to learn how to name your  representative. TTY users call 711. Both you and the person you want to act for you must sign  and date a statement confirming this is what you want. You'll need to mail or fax this statement  to [place]. Keep a copy for your records. Important Information About Your Appeal Rights There are 2 kinds of appeals with [employer] Standard Appeal - We'll give you a written decision on a standard appeal within 30 days after  we get your appeal. Our decision might take longer if you ask for an extension, or if we need  more information about your case. We'll tell you if we're taking extra time and will explain why  more time is needed. If your appeal is for payment of a medical service/item you've already  received, we'll give you a written decision within 60 days. Fast Appeal - We'll give you a decision on a fast appeal within 72 hours after we get your  appeal. You can ask for a fast appeal if you or your doctor believe your health could be seriously  Form CMS 10003-NDMCP OMB Approval [phone] (Expires: 02/21/2023) harmed by waiting up  to 30 days for a decision. You cannot request an expedited appeal if you  are asking [place] to pay you back for a medical service/item you've already received. We'll automatically give you a fast appeal if a doctor asks for one for you or if your doctor  supports your request. If you ask for a fast appeal without support from a doctor, we'll decide  if your request requires a fast appeal. If we don't give you a fast appeal, we'll give you a  decision within 30 days.  The sent an fax to your office  [phone] on Monday 10/06   P2P can be done: You have the opportunity to discuss the denial decision with an Psychiatric nurse;  however, this is a consult only as the denial decision outlined in this letter is final. You may reach  an Psychiatric nurse by calling [phone] and selecting Option #4, from 7:00 a.m. to  8:00 p.m. Central Time, Monday through Friday. case #[id number]

## 2022-11-30 NOTE — Telephone Encounter (Signed)
The imaging was denied stating notes were not submitted that reflected pt having 6 weeks of directed treatment (such as physical therapy or medication trials over a 6 week period) or the treatment wasn't noted within 3 months.   "Imaging requires six weeks of provider directed treatment to be completed. This must have been  completed in the past three months without improved symptoms. Contact (via office visit, phone, email, or messaging) must occur after the treatment is completed. This has not been met because: *The notes sent to [place] do not show you have completed a full six weeks of this type of treatment. *Your treatment did not occur within the last three months. *Symptoms must be the same or worse after treatment to support imaging. *The notes sent to [place] do not show any contact with your provider after you completed your  treatment. This contact is needed to plan your future care.

## 2022-12-02 NOTE — Telephone Encounter (Signed)
Pt returned phone call. Informed pt office is closed. Pt said will call back on Monday.

## 2022-12-05 NOTE — Telephone Encounter (Signed)
If the patient calls back, please advise that we are attempting to see if a peer to peer is needed to try and get the MRI covered.

## 2022-12-06 NOTE — Telephone Encounter (Signed)
Pt came into office with denial letter from insurance. Read pt message above, informed him we would call with any updates.

## 2022-12-12 NOTE — Telephone Encounter (Signed)
Wednesdays afternoons on MD cell. 1pm.

## 2022-12-15 ENCOUNTER — Ambulatory Visit: Payer: Self-pay | Admitting: Diagnostic Neuroimaging

## 2022-12-15 ENCOUNTER — Ambulatory Visit (INDEPENDENT_AMBULATORY_CARE_PROVIDER_SITE_OTHER): Payer: Medicare HMO | Admitting: Diagnostic Neuroimaging

## 2022-12-15 DIAGNOSIS — R200 Anesthesia of skin: Secondary | ICD-10-CM

## 2022-12-15 DIAGNOSIS — M542 Cervicalgia: Secondary | ICD-10-CM | POA: Diagnosis not present

## 2022-12-15 DIAGNOSIS — M5412 Radiculopathy, cervical region: Secondary | ICD-10-CM

## 2022-12-15 DIAGNOSIS — Z0289 Encounter for other administrative examinations: Secondary | ICD-10-CM

## 2022-12-16 ENCOUNTER — Ambulatory Visit: Payer: Medicare HMO | Admitting: Diagnostic Neuroimaging

## 2022-12-16 NOTE — Procedures (Signed)
[facility]  NCS (NERVE CONDUCTION STUDY) WITH EMG (ELECTROMYOGRAPHY) REPORT   STUDY DATE: 12/15/22 PATIENT NAME: George Cooper DOB: 11/27/1955 MRN: [id number]  ORDERING CLINICIAN: Joycelyn Schmid, MD   TECHNOLOGIST: Jenelle Mages ELECTROMYOGRAPHER: Glenford Bayley. Kemani Demarais, MD  CLINICAL INFORMATION: 67 year old male with numbness.  FINDINGS: NERVE CONDUCTION STUDY:  Right median motor response has prolonged distal latency (8.2 ms) normal amplitude and slow conduction velocity.  Left median motor response has prolonged distal latency, decreased amplitude and slow conduction velocity.  Bilateral ulnar motor responses have normal distal and see, decreased amplitudes and slow conduction velocities on the left.  Bilateral ulnar F-wave latencies are slightly prolonged.  Bilateral radial sensory responses have normal peak latencies and decreased amplitudes.  Bilateral median sensory responses could not be obtained.  Bilateral ulnar sensory responses have prolonged peak latencies and normal amplitudes.   NEEDLE ELECTROMYOGRAPHY:  Needle examination of right upper extremity and right cervical paraspinal muscles is normal.    IMPRESSION:   Abnormal study demonstrating: -Bilateral median neuropathies at the wrist consistent with bilateral carpal tunnel syndrome (moderate to severe right, moderate left). -Underlying axonal sensorimotor polyneuropathy.    INTERPRETING PHYSICIAN:  Suanne Marker, MD Certified in Neurology, Neurophysiology and Neuroimaging  [facility] [address] [place], [place] [address] [phone]  [facility]    Nerve / Sites Muscle Latency Ref. Amplitude Ref. Rel Amp Segments Distance Velocity Ref. Area    ms ms mV mV %  cm m/s m/s mVms  R Median - APB     Wrist APB 8.2 <=4.4 5.3 >=4.0 100 Wrist - APB 7   19.7     Upper arm APB 13.9  4.9  92.9 Upper arm - Wrist 24 42 >=49 20.6  L Median - APB      Wrist APB 6.0 <=4.4 1.7 >=4.0 100 Wrist - APB 7   5.6     Upper arm APB 11.4  3.7  220 Upper arm - Wrist 25 47 >=49 14.9  R Ulnar - ADM     Wrist ADM 3.0 <=3.3 6.1 >=6.0 100 Wrist - ADM 7   21.8     B.Elbow ADM 5.1  4.8  78 B.Elbow - Wrist 11 52 >=49 16.4     A.Elbow ADM 8.5  5.8  122 A.Elbow - B.Elbow 17 49 >=49 21.6  L Ulnar - ADM     Wrist ADM 3.2 <=3.3 5.0 >=6.0 100 Wrist - ADM 7   14.9     B.Elbow ADM 6.1  4.4  87.8 B.Elbow - Wrist 11 38 >=49 13.7     A.Elbow ADM 10.3  4.5  102 A.Elbow - B.Elbow 17 41 >=49 15.3             SNC    Nerve / Sites Rec. Site Peak Lat Ref.  Amp Ref. Segments Distance Ref.    ms ms V V  cm ms  R Radial - Anatomical snuff box (Forearm)     Forearm Wrist 2.5 <=2.9 11 >=15 Forearm - Wrist 10   L Radial - Anatomical snuff box (Forearm)     Forearm Wrist 2.5 <=2.9 7 >=15 Forearm - Wrist 10   R Median, Ulnar - Transcarpal comparison     Median Palm Wrist 2.7 <=2.2 6 >=35 Median Palm - Wrist 8         Median Palm - Ulnar Palm  <=0.4  L Median - Orthodromic (Dig II, Mid palm)  Dig II Wrist NR <=3.4 NR >=10 Dig II - Wrist 13   R Median - Orthodromic (Dig II, Mid palm)     Dig II Wrist NR <=3.4 NR >=10 Dig II - Wrist 13   L Ulnar - Orthodromic, (Dig V, Mid palm)     Dig V Wrist 3.9 <=3.1 6 >=5 Dig V - Wrist 11   R Ulnar - Orthodromic, (Dig V, Mid palm)     Dig V Wrist 3.3 <=3.1 6 >=5 Dig V - Wrist 45                  F  Wave    Nerve F Lat Ref.   ms ms  R Ulnar - ADM 33.0 <=32.0  L Ulnar - ADM 34.1 <=32.0         EMG Summary Table    Spontaneous MUAP Recruitment  Muscle IA Fib PSW Fasc Other Amp Dur. Poly Pattern  R. Deltoid Normal None None None _______ Normal Normal Normal Normal  R. Biceps brachii Normal None None None _______ Normal Normal Normal Normal  R. Triceps brachii Normal None None None _______ Normal Normal Normal Normal  R. Flexor carpi radialis Normal None None None _______ Normal Normal Normal Normal  R. First dorsal interosseous  Normal None None None _______ Normal Normal Normal Normal  R. Cervical paraspinals Normal None None None _______ Normal Normal Normal Normal

## 2022-12-19 NOTE — Addendum Note (Signed)
Addended by: Judi Cong on: 12/19/2022 05:33 PM   Modules accepted: Orders

## 2022-12-29 ENCOUNTER — Encounter (HOSPITAL_COMMUNITY): Payer: Self-pay

## 2022-12-29 ENCOUNTER — Other Ambulatory Visit: Payer: Self-pay

## 2022-12-29 ENCOUNTER — Ambulatory Visit (HOSPITAL_COMMUNITY): Payer: Medicare HMO | Attending: Diagnostic Neuroimaging

## 2022-12-29 DIAGNOSIS — R200 Anesthesia of skin: Secondary | ICD-10-CM | POA: Diagnosis not present

## 2022-12-29 DIAGNOSIS — R202 Paresthesia of skin: Secondary | ICD-10-CM | POA: Insufficient documentation

## 2022-12-29 DIAGNOSIS — M542 Cervicalgia: Secondary | ICD-10-CM | POA: Insufficient documentation

## 2022-12-29 DIAGNOSIS — M5412 Radiculopathy, cervical region: Secondary | ICD-10-CM | POA: Insufficient documentation

## 2022-12-29 NOTE — Therapy (Signed)
OUTPATIENT PHYSICAL THERAPY CERVICAL EVALUATION   Patient Name: George Cooper MRN: [id number] DOB:January 21, 1956, 67 y.o., male Today's Date: 12/29/2022  END OF SESSION:  PT End of Session - 12/29/22 1504     Visit Number 1    Date for PT Re-Evaluation 01/26/23    Authorization Type [employer]    Authorization Time Period no auth;no limit    Authorization - Visit Number 1    Progress Note Due on Visit 4    Behavior During Therapy [facility] for tasks assessed/performed             Past Medical History:  Diagnosis Date   Arthritis    "right knee" (09/18/2012)   Carotid artery disease (HCC)    Claudication (HCC)    a. 11/09/15 Hawk atherectomy of SFA   Dizziness    History of gout    Hyperlipidemia    Lyme disease 2006   "after bit by a tick" (09/18/2012)   Renal artery stenosis (HCC)    Tobacco abuse    discontinued October 2014   Past Surgical History:  Procedure Laterality Date   ABDOMINAL ANGIOGRAM  09/18/2012   Procedure: ABDOMINAL ANGIOGRAM;  Surgeon: Runell Gess, MD;  Location: [facility] CATH LAB;  Service: Cardiovascular;;   ABDOMINAL AORTOGRAM W/LOWER EXTREMITY N/A 06/17/2019   Procedure: ABDOMINAL AORTOGRAM W/LOWER EXTREMITY;  Surgeon: Runell Gess, MD;  Location: [facility] INVASIVE CV LAB;  Service: Cardiovascular;  Laterality: N/A;   ATHERECTOMY Right 09/18/2012   Procedure: ATHERECTOMY;  Surgeon: Runell Gess, MD;  Location: [facility] CATH LAB;  Service: Cardiovascular;  Laterality: Right;  right SFA   KNEE ARTHROSCOPY Right 1990's   "twice" (09/18/2012)   KNEE ARTHROSCOPY Left 1980's   LOWER EXTREMITY ANGIOGRAM N/A 09/18/2012   Procedure: LOWER EXTREMITY ANGIOGRAM;  Surgeon: Runell Gess, MD;  Location: [facility] CATH LAB;  Service: Cardiovascular;  Laterality: N/A;   PERCUTANEOUS STENT INTERVENTION  09/18/2012   Procedure: PERCUTANEOUS STENT INTERVENTION;  Surgeon: Runell Gess, MD;  Location: [facility] CATH LAB;  Service: Cardiovascular;;  right SFA   PERIPHERAL ARTERIAL  STENT GRAFT Right 09/18/2012   "PTA & stenting right SFA"/notes 09/18/2012   PERIPHERAL VASCULAR CATHETERIZATION N/A 11/09/2015   Procedure: Abdominal Aortogram w/Lower Extremity;  Surgeon: Runell Gess, MD;  Location: [facility] INVASIVE CV LAB;  Service: Cardiovascular;  Laterality: N/A;   PERIPHERAL VASCULAR CATHETERIZATION Right 11/09/2015   Procedure: Peripheral Vascular Balloon Angioplasty;  Surgeon: Runell Gess, MD;  Location: [facility] INVASIVE CV LAB;  Service: Cardiovascular;  Laterality: Right;  SFA   PERIPHERAL VASCULAR CATHETERIZATION Right 11/09/2015   Procedure: Peripheral Vascular Atherectomy;  Surgeon: Runell Gess, MD;  Location: [facility] INVASIVE CV LAB;  Service: Cardiovascular;  Laterality: Right;  SFA   PERIPHERAL VASCULAR INTERVENTION Right 06/17/2019   Procedure: PERIPHERAL VASCULAR INTERVENTION;  Surgeon: Runell Gess, MD;  Location: [facility] INVASIVE CV LAB;  Service: Cardiovascular;  Laterality: Right;  superficial femoral   Patient Active Problem List   Diagnosis Date Noted   Claudication in peripheral vascular disease (HCC) 06/17/2019   Essential hypertension 02/26/2019   Cerebral infarction (HCC) 11/16/2015   Left-sided weakness 11/16/2015   Headache 11/16/2015   Hyperlipidemia 10/10/2012   Pain of right thigh 09/21/2012   PAD (peripheral artery disease), s/p athrectomy, PTA/ stent Rt. SFA  09/18/2012 09/19/2012   Carotid artery disease (HCC) 08/01/2012   Dizziness 08/01/2012   Claudication (HCC)     PCP: Assunta Found MD  REFERRING PROVIDER: Earley Abide, MD  REFERRING DIAG:  M54.2 (ICD-10-CM) - Neck pain  M54.12 (ICD-10-CM) - Cervical radiculopathy  R20.0 (ICD-10-CM) - Hand numbness    THERAPY DIAG:  Neck pain  Cervical radiculopathy  Numbness and tingling in both hands  Rationale for Evaluation and Treatment: Rehabilitation  ONSET DATE: Around 5 months ago  SUBJECTIVE:                                                                                                                                                                                                          SUBJECTIVE STATEMENT: Pt reports hand numbness in bilateral hands with sensation differences. MD thinks that this issues are coming from CNS and PNS. Pt reports that his pain is worst at night. Pt is Personnel officer. Pt reports that his brother was having same difficulty and ended up having surgery. Pt also plays guitar.  Pt cannot sense a guitar pick or wire nut in his hands. 2 months ago pain was worst. Works part time as an Personnel officer. Intermittently has LLE numbness as well. Pt reports weighting lifting with a lot of shoulder elevation with 12 lb dumbbell.   Hand dominance: Right  PERTINENT HISTORY:  Nerve Conduction study R more severe than L hand.  PAIN:  Are you having pain? Yes: NPRS scale: 7 (hands) 2 (neck)/10 Pain location: primarily in hands Pain description: numbness Aggravating factors: Laying down Relieving factors: Walking around and getting.   PRECAUTIONS: None  RED FLAGS: None     WEIGHT BEARING RESTRICTIONS: No  FALLS:  Has patient fallen in last 6 months? No   OCCUPATION: semi retired Personnel officer  PLOF: Independent  PATIENT GOALS: Get MRI  NEXT MD VISIT: Dependent upon PT POC  OBJECTIVE:  Note: Objective measures were completed at Evaluation unless otherwise noted.  DIAGNOSTIC FINDINGS:  Awaiting MRI resutls  PATIENT SURVEYS:  NDI 10/50   DASH 9.2/100 COGNITION: Overall cognitive status: Within functional limits for tasks assessed  SENSATION: Light touch: Impaired  R sensory loss > L Sensory loss in C7-T1 dermatomal pattern  POSTURE: rounded shoulders and increased lumbar lordosis  PALPATION:    CERVICAL ROM:   Active ROM A/PROM (deg) eval  Flexion 55  Extension 38 and painful  Right lateral flexion 20  Left lateral flexion 15  Right rotation 45  Left rotation 49   (Blank rows = not tested)  UPPER EXTREMITY  ROM:  Active ROM Right eval Left eval  Shoulder flexion 4- 4-  Shoulder extension    Shoulder abduction 4 4  Shoulder adduction    Shoulder extension  Shoulder internal rotation    Shoulder external rotation 4- 3+  Elbow flexion 4- 4-  Elbow extension    Wrist flexion    Wrist extension    Wrist ulnar deviation    Wrist radial deviation    Wrist pronation    Wrist supination     (Blank rows = not tested)  UPPER EXTREMITY MMT:  MMT Right eval Left eval  Shoulder flexion 4- 4-  Shoulder extension    Shoulder abduction 4 4  Shoulder adduction    Shoulder extension    Shoulder internal rotation    Shoulder external rotation 4- 3+  Middle trapezius 4- 4-  Lower trapezius 3- 3-  Elbow flexion 4 4  Elbow extension    Wrist flexion    Wrist extension    Wrist ulnar deviation    Wrist radial deviation    Wrist pronation    Wrist supination    Grip strength     (Blank rows = not tested)  CERVICAL SPECIAL TESTS:  Neck flexor muscle endurance test: Positive (13 seconds), Upper limb tension test (ULTT): Positive, and Distraction test: Positive  FUNCTIONAL TESTS:   TODAY'S TREATMENT:                                                                                                                              DATE: 12/29/2022 PT evaluation.    PATIENT EDUCATION:  Education details: PT Evaluation, findings, prognosis, frequency, attendance policy, and HEP if given.  Person educated: Patient Education method: Explanation Education comprehension: verbalized understanding  HOME EXERCISE PROGRAM: To initiate next session.   ASSESSMENT:  CLINICAL IMPRESSION: Patient is a 67 y.o. male who was seen today for physical therapy evaluation and treatment for  M54.2 (ICD-10-CM) - Neck pain  M54.12 (ICD-10-CM) - Cervical radiculopathy  R20.0 (ICD-10-CM) - Hand numbness  Based upon evaluation, pt demonstrating mild functional work deficits limiting endurance when performing  overhead work due to cervical and shoulder ROM deficits, cervical and periscapular muscle weakness, pain and consistent numbness. Pt's bilateral symptoms and distribution of weakness and numbness demonstrate more findings consistent with CNS  involvement rather peripheral nerval issues indicating need for further evaluation. Per patient, MD waiting on PT in order to be authorized for MRI insurance..  Pt will benefit from skilled Physical Therapy services to address deficits/limitations in order to improve functional and QOL. .   OBJECTIVE IMPAIRMENTS: decreased activity tolerance, decreased ROM, decreased strength, and pain.   ACTIVITY LIMITATIONS: carrying and lifting  PARTICIPATION LIMITATIONS: occupation  PERSONAL FACTORS: Age are also affecting patient's functional outcome.   REHAB POTENTIAL: Good  CLINICAL DECISION MAKING: Stable/uncomplicated  EVALUATION COMPLEXITY: Low   GOALS: Goals reviewed with patient? No  SHORT TERM GOALS: Target date: 01/12/2023  Pt will be independent with HEP in order to demonstrate participation in Physical Therapy POC.  Baseline: Goal status: INITIAL  2.  Pt will report reduction in pain in bilateral hands to 5/10  during functional activity to demonstrate improved pain tolerance  Baseline:  Goal status: INITIAL  LONG TERM GOALS: Target date: 01/27/2023  Pt will improve Deep Neck Flexor Endurance test by > 5 seconds in order to demonstrate improved functional strength to return to desired activities.  Baseline: See objective.  Goal status: INITIAL  2.  Pt will improve Cervical ROM by 5 degrees in all directions in order to improve functional mobility during ADLs.  Baseline: See objective.  Goal status: INITIAL  3.  Pt will report reduction in pain in bilateral hands to 4/10 during functional activity to demonstrate improved pain tolerance. Baseline: See objective.  Goal status: INITIAL    PLAN:  PT FREQUENCY: 1x/week  PT DURATION: 4  weeks  PLANNED INTERVENTIONS: 97164- PT Re-evaluation, 97110-Therapeutic exercises, 97530- Therapeutic activity, 97112- Neuromuscular re-education, 97535- Self Care, 11914- Manual therapy, Joint mobilization, and Spinal mobilization  PLAN FOR NEXT SESSION: grip strength testing, neck postural endurance, and periscapular strengthening.   Nelida Meuse PT, DPT Physical Therapist with [facility] 336 [phone] office   Nelida Meuse, PT 12/29/2022, 3:08 PM

## 2023-01-05 ENCOUNTER — Ambulatory Visit (HOSPITAL_BASED_OUTPATIENT_CLINIC_OR_DEPARTMENT_OTHER)
Admission: RE | Admit: 2023-01-05 | Discharge: 2023-01-05 | Disposition: A | Discharge status: Home / Self Care | Payer: Medicare HMO | Source: Ambulatory Visit | Attending: Cardiovascular Disease | Admitting: Cardiovascular Disease

## 2023-01-05 ENCOUNTER — Ambulatory Visit (HOSPITAL_COMMUNITY)
Admission: RE | Admit: 2023-01-05 | Discharge: 2023-01-05 | Disposition: A | Discharge status: Home / Self Care | Payer: Medicare HMO | Source: Ambulatory Visit | Attending: Cardiology | Admitting: Cardiology

## 2023-01-05 DIAGNOSIS — I739 Peripheral vascular disease, unspecified: Secondary | ICD-10-CM

## 2023-01-05 LAB — VAS US ABI WITH/WO TBI
Left ABI: 1.1
Right ABI: 1.01

## 2023-01-06 ENCOUNTER — Encounter (HOSPITAL_COMMUNITY): Payer: Medicare HMO

## 2023-01-09 ENCOUNTER — Other Ambulatory Visit (HOSPITAL_COMMUNITY): Payer: Self-pay

## 2023-01-09 DIAGNOSIS — I739 Peripheral vascular disease, unspecified: Secondary | ICD-10-CM

## 2023-01-12 ENCOUNTER — Encounter (HOSPITAL_COMMUNITY): Payer: Self-pay

## 2023-01-12 ENCOUNTER — Ambulatory Visit (HOSPITAL_COMMUNITY): Payer: Medicare HMO

## 2023-01-12 DIAGNOSIS — M542 Cervicalgia: Secondary | ICD-10-CM

## 2023-01-12 DIAGNOSIS — M5412 Radiculopathy, cervical region: Secondary | ICD-10-CM | POA: Diagnosis not present

## 2023-01-12 DIAGNOSIS — R202 Paresthesia of skin: Secondary | ICD-10-CM | POA: Diagnosis not present

## 2023-01-12 DIAGNOSIS — R200 Anesthesia of skin: Secondary | ICD-10-CM | POA: Diagnosis not present

## 2023-01-12 NOTE — Therapy (Signed)
OUTPATIENT PHYSICAL THERAPY CERVICAL EVALUATION   Patient Name: George Cooper MRN: [id number] DOB:1955/09/18, 67 y.o., male Today's Date: 01/12/2023  END OF SESSION:  PT End of Session - 01/12/23 1432     Visit Number 2    Date for PT Re-Evaluation 01/26/23    Authorization Type [employer] Medicare    Authorization - Visit Number 2    Progress Note Due on Visit 4    PT Start Time 1351    PT Stop Time 1430    PT Time Calculation (min) 39 min    Activity Tolerance Patient tolerated treatment well    Behavior During Therapy [facility] for tasks assessed/performed              Past Medical History:  Diagnosis Date   Arthritis    "right knee" (09/18/2012)   Carotid artery disease (HCC)    Claudication (HCC)    a. 11/09/15 Hawk atherectomy of SFA   Dizziness    History of gout    Hyperlipidemia    Lyme disease 2006   "after bit by a tick" (09/18/2012)   Renal artery stenosis (HCC)    Tobacco abuse    discontinued October 2014   Past Surgical History:  Procedure Laterality Date   ABDOMINAL ANGIOGRAM  09/18/2012   Procedure: ABDOMINAL ANGIOGRAM;  Surgeon: Runell Gess, MD;  Location: [facility] CATH LAB;  Service: Cardiovascular;;   ABDOMINAL AORTOGRAM W/LOWER EXTREMITY N/A 06/17/2019   Procedure: ABDOMINAL AORTOGRAM W/LOWER EXTREMITY;  Surgeon: Runell Gess, MD;  Location: [facility] INVASIVE CV LAB;  Service: Cardiovascular;  Laterality: N/A;   ATHERECTOMY Right 09/18/2012   Procedure: ATHERECTOMY;  Surgeon: Runell Gess, MD;  Location: [facility] CATH LAB;  Service: Cardiovascular;  Laterality: Right;  right SFA   KNEE ARTHROSCOPY Right 1990's   "twice" (09/18/2012)   KNEE ARTHROSCOPY Left 1980's   LOWER EXTREMITY ANGIOGRAM N/A 09/18/2012   Procedure: LOWER EXTREMITY ANGIOGRAM;  Surgeon: Runell Gess, MD;  Location: [facility] CATH LAB;  Service: Cardiovascular;  Laterality: N/A;   PERCUTANEOUS STENT INTERVENTION  09/18/2012   Procedure: PERCUTANEOUS STENT INTERVENTION;  Surgeon: Runell Gess, MD;  Location: [facility] CATH LAB;  Service: Cardiovascular;;  right SFA   PERIPHERAL ARTERIAL STENT GRAFT Right 09/18/2012   "PTA & stenting right SFA"/notes 09/18/2012   PERIPHERAL VASCULAR CATHETERIZATION N/A 11/09/2015   Procedure: Abdominal Aortogram w/Lower Extremity;  Surgeon: Runell Gess, MD;  Location: [facility] INVASIVE CV LAB;  Service: Cardiovascular;  Laterality: N/A;   PERIPHERAL VASCULAR CATHETERIZATION Right 11/09/2015   Procedure: Peripheral Vascular Balloon Angioplasty;  Surgeon: Runell Gess, MD;  Location: [facility] INVASIVE CV LAB;  Service: Cardiovascular;  Laterality: Right;  SFA   PERIPHERAL VASCULAR CATHETERIZATION Right 11/09/2015   Procedure: Peripheral Vascular Atherectomy;  Surgeon: Runell Gess, MD;  Location: [facility] INVASIVE CV LAB;  Service: Cardiovascular;  Laterality: Right;  SFA   PERIPHERAL VASCULAR INTERVENTION Right 06/17/2019   Procedure: PERIPHERAL VASCULAR INTERVENTION;  Surgeon: Runell Gess, MD;  Location: [facility] INVASIVE CV LAB;  Service: Cardiovascular;  Laterality: Right;  superficial femoral   Patient Active Problem List   Diagnosis Date Noted   Claudication in peripheral vascular disease (HCC) 06/17/2019   Essential hypertension 02/26/2019   Cerebral infarction (HCC) 11/16/2015   Left-sided weakness 11/16/2015   Headache 11/16/2015   Hyperlipidemia 10/10/2012   Pain of right thigh 09/21/2012   PAD (peripheral artery disease), s/p athrectomy, PTA/ stent Rt. SFA  09/18/2012 09/19/2012   Carotid artery disease (HCC)  08/01/2012   Dizziness 08/01/2012   Claudication (HCC)     PCP: Assunta Found MD  REFERRING PROVIDER: Earley Abide, MD  REFERRING DIAG:  M54.2 (ICD-10-CM) - Neck pain  M54.12 (ICD-10-CM) - Cervical radiculopathy  R20.0 (ICD-10-CM) - Hand numbness    THERAPY DIAG:  Neck pain  Numbness and tingling in both hands  Cervical radiculopathy  Rationale for Evaluation and Treatment: Rehabilitation  ONSET DATE: Around 5 months  ago  SUBJECTIVE:                                                                                                                                                                                                         SUBJECTIVE STATEMENT: No pain, just tingling per patient. Currently, neck is bothersome. .   Hand dominance: Right  PERTINENT HISTORY:  Nerve Conduction study R more severe than L hand.  PAIN:  Are you having pain? Yes: NPRS scale: 7 (hands) 2 (neck)/10 Pain location: primarily in hands Pain description: numbness Aggravating factors: Laying down Relieving factors: Walking around and getting.   PRECAUTIONS: None  RED FLAGS: None     WEIGHT BEARING RESTRICTIONS: No  FALLS:  Has patient fallen in last 6 months? No   OCCUPATION: semi retired Personnel officer  PLOF: Independent  PATIENT GOALS: Get MRI  NEXT MD VISIT: Dependent upon PT POC  OBJECTIVE:  Note: Objective measures were completed at Evaluation unless otherwise noted.  DIAGNOSTIC FINDINGS:  Awaiting MRI resutls  PATIENT SURVEYS:  NDI 10/50   DASH 9.2/100 COGNITION: Overall cognitive status: Within functional limits for tasks assessed  SENSATION: Light touch: Impaired  R sensory loss > L Sensory loss in C7-T1 dermatomal pattern  POSTURE: rounded shoulders and increased lumbar lordosis  PALPATION:    CERVICAL ROM:   Active ROM A/PROM (deg) eval  Flexion 55  Extension 38 and painful  Right lateral flexion 20  Left lateral flexion 15  Right rotation 45  Left rotation 49   (Blank rows = not tested)  UPPER EXTREMITY ROM:  Active ROM Right eval Left eval  Shoulder flexion 130 160  Shoulder extension    Shoulder abduction 120 130  Shoulder adduction    Shoulder extension    Shoulder internal rotation    Shoulder external rotation    Elbow flexion    Elbow extension    Wrist flexion    Wrist extension    Wrist ulnar deviation    Wrist radial deviation    Wrist pronation     Wrist supination     (Blank rows =  not tested)  UPPER EXTREMITY MMT:  MMT Right eval Left eval  Shoulder flexion 4- 4-  Shoulder extension    Shoulder abduction 4 4  Shoulder adduction    Shoulder extension    Shoulder internal rotation    Shoulder external rotation 4- 3+  Middle trapezius 4- 4-  Lower trapezius 3- 3-  Elbow flexion 4 4  Elbow extension    Wrist flexion    Wrist extension    Wrist ulnar deviation    Wrist radial deviation    Wrist pronation    Wrist supination    Grip strength     (Blank rows = not tested)  CERVICAL SPECIAL TESTS:  Neck flexor muscle endurance test: Positive (13 seconds), Upper limb tension test (ULTT): Positive, and Distraction test: Positive  FUNCTIONAL TESTS:   TODAY'S TREATMENT:                                                                                                                              DATE:  01/12/2023  -Grip Strength: R: 85,75 ; L: 87, 95lbs Postural training -Scapular retraction 10x10' -Chin tucks 10x10' -Seated assist cervical rotation stretch with towel 5x5' bilaterally-edu on peripheralization vs centralization -Upper Cervical Extension w/ towel 5 x 5' -Upper trap stretch bilaterally x 1' -Seated shoulder horizontal abduction with RTB x 12 -Seated shoulder ER with RTB x 12 for 3' -Prone horizontal abduction x 15 bilaterally -Deep neck flexor endurance 3x max time  12/29/2022 PT evaluation.    PATIENT EDUCATION:  Education details: PT Evaluation, findings, prognosis, frequency, attendance policy, and HEP if given.  Person educated: Patient Education method: Explanation Education comprehension: verbalized understanding  HOME EXERCISE PROGRAM: Access Code: [email or web address] URL: [email or web address] Date: 01/12/2023 Prepared by: Starling Manns  Exercises - Seated Scapular Retraction  - 1 x daily - 7 x weekly - 3 sets - 10 reps - Seated Cervical Retraction  - 1 x daily - 7 x weekly - 3 sets  - 10 reps - Seated Assisted Cervical Rotation with Towel  - 1 x daily - 7 x weekly - 3 sets - 10 reps - Cervical Extension AROM with Strap  - 1 x daily - 7 x weekly - 3 sets - 10 reps - Seated Cervical Sidebending Stretch  - 1 x daily - 7 x weekly - 3 sets - 10 reps - Seated Shoulder Horizontal Abduction with Resistance  - 1 x daily - 7 x weekly - 3 sets - 10 reps - Seated Shoulder Horizontal Abduction with Resistance  - 1 x daily - 7 x weekly - 3 sets - 10 reps - Prone Shoulder Horizontal Abduction with Thumbs Up  - 1 x daily - 7 x weekly - 3 sets - 10 reps - Supine Deep Neck Flexor Training - Repetitions  - 1 x daily - 7 x weekly - 3 sets - 10 reps  ASSESSMENT:  CLINICAL IMPRESSION: Pt tolerated initial session well. Built HEP  with postural training during ADLs with education regarding activity tolerance and rest break. Given ROM and strengthening for scapular region for postural strengthening. Poor deep neck flexor endurance. Continue to build HEP.  Pt will benefit from skilled Physical Therapy services to address deficits/limitations in order to improve functional and QOL. .   OBJECTIVE IMPAIRMENTS: decreased activity tolerance, decreased ROM, decreased strength, and pain.   ACTIVITY LIMITATIONS: carrying and lifting  PARTICIPATION LIMITATIONS: occupation  PERSONAL FACTORS: Age are also affecting patient's functional outcome.   REHAB POTENTIAL: Good  CLINICAL DECISION MAKING: Stable/uncomplicated  EVALUATION COMPLEXITY: Low   GOALS: Goals reviewed with patient? No  SHORT TERM GOALS: Target date: 01/12/2023  Pt will be independent with HEP in order to demonstrate participation in Physical Therapy POC.  Baseline: Goal status: INITIAL  2.  Pt will report reduction in pain in bilateral hands to 5/10 during functional activity to demonstrate improved pain tolerance  Baseline:  Goal status: INITIAL  LONG TERM GOALS: Target date: 01/27/2023  Pt will improve Deep Neck Flexor  Endurance test by > 5 seconds in order to demonstrate improved functional strength to return to desired activities.  Baseline: See objective.  Goal status: INITIAL  2.  Pt will improve Cervical ROM by 5 degrees in all directions in order to improve functional mobility during ADLs.  Baseline: See objective.  Goal status: INITIAL  3.  Pt will report reduction in pain in bilateral hands to 4/10 during functional activity to demonstrate improved pain tolerance. Baseline: See objective.  Goal status: INITIAL    PLAN:  PT FREQUENCY: 1x/week  PT DURATION: 4 weeks  PLANNED INTERVENTIONS: 97164- PT Re-evaluation, 97110-Therapeutic exercises, 97530- Therapeutic activity, 97112- Neuromuscular re-education, 97535- Self Care, 40981- Manual therapy, Joint mobilization, and Spinal mobilization  PLAN FOR NEXT SESSION:  neck postural endurance, and periscapular strengthening.   Nelida Meuse PT, DPT Physical Therapist with [facility] 336 [phone] office   Nelida Meuse, PT 01/12/2023, 2:33 PM

## 2023-01-26 ENCOUNTER — Encounter (HOSPITAL_COMMUNITY): Payer: Self-pay

## 2023-01-26 ENCOUNTER — Ambulatory Visit (HOSPITAL_COMMUNITY): Payer: Medicare HMO | Attending: Diagnostic Neuroimaging

## 2023-01-26 DIAGNOSIS — R202 Paresthesia of skin: Secondary | ICD-10-CM | POA: Insufficient documentation

## 2023-01-26 DIAGNOSIS — R200 Anesthesia of skin: Secondary | ICD-10-CM | POA: Diagnosis not present

## 2023-01-26 DIAGNOSIS — M5412 Radiculopathy, cervical region: Secondary | ICD-10-CM | POA: Diagnosis not present

## 2023-01-26 DIAGNOSIS — M542 Cervicalgia: Secondary | ICD-10-CM | POA: Insufficient documentation

## 2023-01-26 NOTE — Therapy (Signed)
OUTPATIENT PHYSICAL THERAPY CERVICAL TREATMENT   Patient Name: George Cooper MRN: [id number] DOB:Jul 07, 1955, 67 y.o., male Today's Date: 01/26/2023  END OF SESSION:  PT End of Session - 01/26/23 1429     Visit Number 3    Date for PT Re-Evaluation 03/03/23    Authorization Type [employer] Medicare    Authorization Time Period seeking new certificaition    Progress Note Due on Visit 6    PT Start Time 1346    PT Stop Time 1425    PT Time Calculation (min) 39 min    Activity Tolerance Patient tolerated treatment well    Behavior During Therapy [facility] for tasks assessed/performed               Past Medical History:  Diagnosis Date   Arthritis    "right knee" (09/18/2012)   Carotid artery disease (HCC)    Claudication (HCC)    a. 11/09/15 Hawk atherectomy of SFA   Dizziness    History of gout    Hyperlipidemia    Lyme disease 2006   "after bit by a tick" (09/18/2012)   Renal artery stenosis (HCC)    Tobacco abuse    discontinued October 2014   Past Surgical History:  Procedure Laterality Date   ABDOMINAL ANGIOGRAM  09/18/2012   Procedure: ABDOMINAL ANGIOGRAM;  Surgeon: Runell Gess, MD;  Location: [facility] CATH LAB;  Service: Cardiovascular;;   ABDOMINAL AORTOGRAM W/LOWER EXTREMITY N/A 06/17/2019   Procedure: ABDOMINAL AORTOGRAM W/LOWER EXTREMITY;  Surgeon: Runell Gess, MD;  Location: [facility] INVASIVE CV LAB;  Service: Cardiovascular;  Laterality: N/A;   ATHERECTOMY Right 09/18/2012   Procedure: ATHERECTOMY;  Surgeon: Runell Gess, MD;  Location: [facility] CATH LAB;  Service: Cardiovascular;  Laterality: Right;  right SFA   KNEE ARTHROSCOPY Right 1990's   "twice" (09/18/2012)   KNEE ARTHROSCOPY Left 1980's   LOWER EXTREMITY ANGIOGRAM N/A 09/18/2012   Procedure: LOWER EXTREMITY ANGIOGRAM;  Surgeon: Runell Gess, MD;  Location: [facility] CATH LAB;  Service: Cardiovascular;  Laterality: N/A;   PERCUTANEOUS STENT INTERVENTION  09/18/2012   Procedure: PERCUTANEOUS STENT INTERVENTION;   Surgeon: Runell Gess, MD;  Location: [facility] CATH LAB;  Service: Cardiovascular;;  right SFA   PERIPHERAL ARTERIAL STENT GRAFT Right 09/18/2012   "PTA & stenting right SFA"/notes 09/18/2012   PERIPHERAL VASCULAR CATHETERIZATION N/A 11/09/2015   Procedure: Abdominal Aortogram w/Lower Extremity;  Surgeon: Runell Gess, MD;  Location: [facility] INVASIVE CV LAB;  Service: Cardiovascular;  Laterality: N/A;   PERIPHERAL VASCULAR CATHETERIZATION Right 11/09/2015   Procedure: Peripheral Vascular Balloon Angioplasty;  Surgeon: Runell Gess, MD;  Location: [facility] INVASIVE CV LAB;  Service: Cardiovascular;  Laterality: Right;  SFA   PERIPHERAL VASCULAR CATHETERIZATION Right 11/09/2015   Procedure: Peripheral Vascular Atherectomy;  Surgeon: Runell Gess, MD;  Location: [facility] INVASIVE CV LAB;  Service: Cardiovascular;  Laterality: Right;  SFA   PERIPHERAL VASCULAR INTERVENTION Right 06/17/2019   Procedure: PERIPHERAL VASCULAR INTERVENTION;  Surgeon: Runell Gess, MD;  Location: [facility] INVASIVE CV LAB;  Service: Cardiovascular;  Laterality: Right;  superficial femoral   Patient Active Problem List   Diagnosis Date Noted   Claudication in peripheral vascular disease (HCC) 06/17/2019   Essential hypertension 02/26/2019   Cerebral infarction (HCC) 11/16/2015   Left-sided weakness 11/16/2015   Headache 11/16/2015   Hyperlipidemia 10/10/2012   Pain of right thigh 09/21/2012   PAD (peripheral artery disease), s/p athrectomy, PTA/ stent Rt. SFA  09/18/2012 09/19/2012   Carotid artery  disease (HCC) 08/01/2012   Dizziness 08/01/2012   Claudication (HCC)     PCP: Assunta Found MD  REFERRING PROVIDER: Earley Abide, MD  REFERRING DIAG:  M54.2 (ICD-10-CM) - Neck pain  M54.12 (ICD-10-CM) - Cervical radiculopathy  R20.0 (ICD-10-CM) - Hand numbness    THERAPY DIAG:  Neck pain  Numbness and tingling in both hands  Cervical radiculopathy  Rationale for Evaluation and Treatment: Rehabilitation  ONSET  DATE: Around 5 months ago  SUBJECTIVE:                                                                                                                                                                                                         SUBJECTIVE STATEMENT: Pt reports that the neck towel exercises, turning his head, he reports hearing and experiencing a pop on the right side of his neck and has stopped doing that exercise because of the pain. Pain is currently 5/10, specifically where the pop happened. Pt reports he continues to experience numbness and tingling into BUE at night.   Hand dominance: Right  PERTINENT HISTORY:  Nerve Conduction study R more severe than L hand.  PAIN:  Are you having pain? Yes: NPRS scale: 7 (hands) 2 (neck)/10 Pain location: primarily in hands Pain description: numbness Aggravating factors: Laying down Relieving factors: Walking around and getting.   PRECAUTIONS: None  RED FLAGS: None     WEIGHT BEARING RESTRICTIONS: No  FALLS:  Has patient fallen in last 6 months? No   OCCUPATION: semi retired Personnel officer  PLOF: Independent  PATIENT GOALS: Get MRI  NEXT MD VISIT: Dependent upon PT POC  OBJECTIVE:  Note: Objective measures were completed at Evaluation unless otherwise noted.  DIAGNOSTIC FINDINGS:  Awaiting MRI resutls  PATIENT SURVEYS:  NDI 10/50   DASH 9.2/100 COGNITION: Overall cognitive status: Within functional limits for tasks assessed  SENSATION: Light touch: Impaired  R sensory loss > L Sensory loss in C7-T1 dermatomal pattern  POSTURE: rounded shoulders and increased lumbar lordosis  PALPATION:    CERVICAL ROM:   Active ROM A/PROM (deg) eval  Flexion 55  Extension 38 and painful  Right lateral flexion 20  Left lateral flexion 15  Right rotation 45  Left rotation 49   (Blank rows = not tested)  UPPER EXTREMITY ROM:  Active ROM Right eval Left eval  Shoulder flexion 130 160  Shoulder extension     Shoulder abduction 120 130  Shoulder adduction    Shoulder extension    Shoulder internal rotation    Shoulder external rotation  Elbow flexion    Elbow extension    Wrist flexion    Wrist extension    Wrist ulnar deviation    Wrist radial deviation    Wrist pronation    Wrist supination     (Blank rows = not tested)  UPPER EXTREMITY MMT:  MMT Right eval Left eval  Shoulder flexion 4- 4-  Shoulder extension    Shoulder abduction 4 4  Shoulder adduction    Shoulder extension    Shoulder internal rotation    Shoulder external rotation 4- 3+  Middle trapezius 4- 4-  Lower trapezius 3- 3-  Elbow flexion 4 4  Elbow extension    Wrist flexion    Wrist extension    Wrist ulnar deviation    Wrist radial deviation    Wrist pronation    Wrist supination    Grip strength     (Blank rows = not tested)  CERVICAL SPECIAL TESTS:  Neck flexor muscle endurance test: Positive (13 seconds), Upper limb tension test (ULTT): Positive, and Distraction test: Positive  FUNCTIONAL TESTS:   TODAY'S TREATMENT:                                                                                                                              DATE:  01/26/2023  -Neck Flexor Muscle endurance test: 21 seconds -STM Upper trapezius and bilateral scalenes -With bilateral scalene stretch passive opposite lateral flexion, same side rotation -Quadruped cervical retraction with theraband x 8 with cues for reduced thoracic compensation -lateral flexion scalene stretching 3x30'' bilaterally  01/12/2023  -Grip Strength: R: 85,75 ; L: 87, 95lbs Postural training -Scapular retraction 10x10' -Chin tucks 10x10' -Seated assist cervical rotation stretch with towel 5x5' bilaterally-edu on peripheralization vs centralization -Upper Cervical Extension w/ towel 5 x 5' -Upper trap stretch bilaterally x 1' -Seated shoulder horizontal abduction with RTB x 12 -Seated shoulder ER with RTB x 12 for 3' -Prone  horizontal abduction x 15 bilaterally -Deep neck flexor endurance 3x max time  12/29/2022 PT evaluation.    PATIENT EDUCATION:  Education details: PT Evaluation, findings, prognosis, frequency, attendance policy, and HEP if given.  Person educated: Patient Education method: Explanation Education comprehension: verbalized understanding  HOME EXERCISE PROGRAM: Access Code: [email or web address] URL: [email or web address] Date: 01/12/2023 Prepared by: Starling Manns  Exercises - Seated Scapular Retraction  - 1 x daily - 7 x weekly - 3 sets - 10 reps - Seated Cervical Retraction  - 1 x daily - 7 x weekly - 3 sets - 10 reps - Seated Assisted Cervical Rotation with Towel  - 1 x daily - 7 x weekly - 3 sets - 10 reps - Cervical Extension AROM with Strap  - 1 x daily - 7 x weekly - 3 sets - 10 reps - Seated Cervical Sidebending Stretch  - 1 x daily - 7 x weekly - 3 sets - 10 reps - Seated Shoulder Horizontal Abduction with Resistance  - 1  x daily - 7 x weekly - 3 sets - 10 reps - Seated Shoulder Horizontal Abduction with Resistance  - 1 x daily - 7 x weekly - 3 sets - 10 reps - Prone Shoulder Horizontal Abduction with Thumbs Up  - 1 x daily - 7 x weekly - 3 sets - 10 reps - Supine Deep Neck Flexor Training - Repetitions  - 1 x daily - 7 x weekly - 3 sets - 10 reps  Access Code: [email or web address] URL: [email or web address] Date: 01/26/2023 Prepared by: Starling Manns  Exercises - Seated Scalene Stretch with Towel  - 1 x daily - 7 x weekly - 3 sets - 10 reps - Quadruped Cervical Retraction  - 1 x daily - 7 x weekly - 3 sets - 10 reps  ASSESSMENT:  CLINICAL IMPRESSION: Pt continues to show poor response to HEP with increased pain to R side cervical rotation, continued numbness and tingling in bilateral hands. Session today found and addressed soft tissue reduced mobility in scalenes with focused interventions on mobility through manual techniques and passive self stretching. Pt  continues to show ROM limitations and muscle weakness in setting of neck pain and symptoms related to central involvement. Pt added 2 more visits of therapy visits to total 6 weeks of therapy as insurance reached out patient stating needing 6 weeks of visit. Seeking new certification. . Pt will benefit from skilled Physical Therapy services to address deficits/limitations in order to improve functional and QOL. .   OBJECTIVE IMPAIRMENTS: decreased activity tolerance, decreased ROM, decreased strength, and pain.   ACTIVITY LIMITATIONS: carrying and lifting  PARTICIPATION LIMITATIONS: occupation  PERSONAL FACTORS: Age are also affecting patient's functional outcome.   REHAB POTENTIAL: Good  CLINICAL DECISION MAKING: Stable/uncomplicated  EVALUATION COMPLEXITY: Low   GOALS: Goals reviewed with patient? No  SHORT TERM GOALS: Target date: 01/12/2023  Pt will be independent with HEP in order to demonstrate participation in Physical Therapy POC.  Baseline: Goal status: IN PROGRESS  2.  Pt will report reduction in pain in bilateral hands to 5/10 during functional activity to demonstrate improved pain tolerance  Baseline:  Goal status: IN PROGRESS  LONG TERM GOALS: Target date: 01/27/2023  Pt will improve Deep Neck Flexor Endurance test by > 5 seconds in order to demonstrate improved functional strength to return to desired activities.  Baseline: See objective.  Goal status: IN PROGRESS  2.  Pt will improve Cervical ROM by 5 degrees in all directions in order to improve functional mobility during ADLs.  Baseline: See objective.  Goal status: IN PROGRESS  3.  Pt will report reduction in pain in bilateral hands to 4/10 during functional activity to demonstrate improved pain tolerance. Baseline: See objective.  Goal status: IN PROGRESS    PLAN:  PT FREQUENCY: 1x/week  PT DURATION: 4 weeks  PLANNED INTERVENTIONS: 97164- PT Re-evaluation, 97110-Therapeutic exercises, 97530-  Therapeutic activity, 97112- Neuromuscular re-education, 97535- Self Care, 65784- Manual therapy, Joint mobilization, and Spinal mobilization  PLAN FOR NEXT SESSION:  neck postural endurance, and periscapular strengthening.   Nelida Meuse PT, DPT Physical Therapist with Tomasa Hosteller [facility] [phone] office   Nelida Meuse, PT 01/26/2023, 2:32 PM

## 2023-02-02 ENCOUNTER — Ambulatory Visit (HOSPITAL_COMMUNITY): Payer: Medicare HMO

## 2023-02-02 ENCOUNTER — Encounter (HOSPITAL_COMMUNITY): Payer: Self-pay

## 2023-02-02 DIAGNOSIS — M542 Cervicalgia: Secondary | ICD-10-CM | POA: Diagnosis not present

## 2023-02-02 DIAGNOSIS — M5412 Radiculopathy, cervical region: Secondary | ICD-10-CM

## 2023-02-02 DIAGNOSIS — R202 Paresthesia of skin: Secondary | ICD-10-CM | POA: Diagnosis not present

## 2023-02-02 DIAGNOSIS — R200 Anesthesia of skin: Secondary | ICD-10-CM | POA: Diagnosis not present

## 2023-02-02 NOTE — Therapy (Signed)
OUTPATIENT PHYSICAL THERAPY CERVICAL TREATMENT   Patient Name: George Cooper MRN: [id number] DOB:1955/03/13, 67 y.o., male Today's Date: 02/02/2023  END OF SESSION:  PT End of Session - 02/02/23 1430     Visit Number 4    Date for PT Re-Evaluation 03/03/23    Authorization Type A[employer]na Medicare    Authorization Time Period Certification approved 4 visits from 12/3-1/10/25    Authorization - Visit Number 3    Progress Note Due on Visit 6    PT Start Time 1349    PT Stop Time 1427    PT Time Calculation (min) 38 min    Activity Tolerance Patient tolerated treatment well    Behavior During Therapy [facility] for tasks assessed/performed                Past Medical History:  Diagnosis Date   Arthritis    "right knee" (09/18/2012)   Carotid artery disease (HCC)    Claudication (HCC)    a. 11/09/15 Hawk atherectomy of SFA   Dizziness    History of gout    Hyperlipidemia    Lyme disease 2006   "after bit by a tick" (09/18/2012)   Renal artery stenosis (HCC)    Tobacco abuse    discontinued October 2014   Past Surgical History:  Procedure Laterality Date   ABDOMINAL ANGIOGRAM  09/18/2012   Procedure: ABDOMINAL ANGIOGRAM;  Surgeon: Runell Gess, MD;  Location: [facility] CATH LAB;  Service: Cardiovascular;;   ABDOMINAL AORTOGRAM W/LOWER EXTREMITY N/A 06/17/2019   Procedure: ABDOMINAL AORTOGRAM W/LOWER EXTREMITY;  Surgeon: Runell Gess, MD;  Location: [facility] INVASIVE CV LAB;  Service: Cardiovascular;  Laterality: N/A;   ATHERECTOMY Right 09/18/2012   Procedure: ATHERECTOMY;  Surgeon: Runell Gess, MD;  Location: [facility] CATH LAB;  Service: Cardiovascular;  Laterality: Right;  right SFA   KNEE ARTHROSCOPY Right 1990's   "twice" (09/18/2012)   KNEE ARTHROSCOPY Left 1980's   LOWER EXTREMITY ANGIOGRAM N/A 09/18/2012   Procedure: LOWER EXTREMITY ANGIOGRAM;  Surgeon: Runell Gess, MD;  Location: [facility] CATH LAB;  Service: Cardiovascular;  Laterality: N/A;   PERCUTANEOUS STENT INTERVENTION   09/18/2012   Procedure: PERCUTANEOUS STENT INTERVENTION;  Surgeon: Runell Gess, MD;  Location: [facility] CATH LAB;  Service: Cardiovascular;;  right SFA   PERIPHERAL ARTERIAL STENT GRAFT Right 09/18/2012   "PTA & stenting right SFA"/notes 09/18/2012   PERIPHERAL VASCULAR CATHETERIZATION N/A 11/09/2015   Procedure: Abdominal Aortogram w/Lower Extremity;  Surgeon: Runell Gess, MD;  Location: [facility] INVASIVE CV LAB;  Service: Cardiovascular;  Laterality: N/A;   PERIPHERAL VASCULAR CATHETERIZATION Right 11/09/2015   Procedure: Peripheral Vascular Balloon Angioplasty;  Surgeon: Runell Gess, MD;  Location: [facility] INVASIVE CV LAB;  Service: Cardiovascular;  Laterality: Right;  SFA   PERIPHERAL VASCULAR CATHETERIZATION Right 11/09/2015   Procedure: Peripheral Vascular Atherectomy;  Surgeon: Runell Gess, MD;  Location: [facility] INVASIVE CV LAB;  Service: Cardiovascular;  Laterality: Right;  SFA   PERIPHERAL VASCULAR INTERVENTION Right 06/17/2019   Procedure: PERIPHERAL VASCULAR INTERVENTION;  Surgeon: Runell Gess, MD;  Location: [facility] INVASIVE CV LAB;  Service: Cardiovascular;  Laterality: Right;  superficial femoral   Patient Active Problem List   Diagnosis Date Noted   Claudication in peripheral vascular disease (HCC) 06/17/2019   Essential hypertension 02/26/2019   Cerebral infarction (HCC) 11/16/2015   Left-sided weakness 11/16/2015   Headache 11/16/2015   Hyperlipidemia 10/10/2012   Pain of right thigh 09/21/2012   PAD (peripheral artery disease), s/p  athrectomy, PTA/ stent Rt. SFA  09/18/2012 09/19/2012   Carotid artery disease (HCC) 08/01/2012   Dizziness 08/01/2012   Claudication (HCC)     PCP: Assunta Found MD  REFERRING PROVIDER: Earley Abide, MD  REFERRING DIAG:  M54.2 (ICD-10-CM) - Neck pain  M54.12 (ICD-10-CM) - Cervical radiculopathy  R20.0 (ICD-10-CM) - Hand numbness    THERAPY DIAG:  Neck pain  Numbness and tingling in both hands  Cervical  radiculopathy  Rationale for Evaluation and Treatment: Rehabilitation  ONSET DATE: Around 5 months ago  SUBJECTIVE:                                                                                                                                                                                                         SUBJECTIVE STATEMENT: Pt reports continual symptoms of R hand numbness when sleeping on his R side. 6/10 pain level currently.   Hand dominance: Right  PERTINENT HISTORY:  Nerve Conduction study R more severe than L hand.  PAIN:  Are you having pain? Yes: NPRS scale: 7 (hands) 2 (neck)/10 Pain location: primarily in hands Pain description: numbness Aggravating factors: Laying down Relieving factors: Walking around and getting.   PRECAUTIONS: None  RED FLAGS: None     WEIGHT BEARING RESTRICTIONS: No  FALLS:  Has patient fallen in last 6 months? No   OCCUPATION: semi retired Personnel officer  PLOF: Independent  PATIENT GOALS: Get MRI  NEXT MD VISIT: Dependent upon PT POC  OBJECTIVE:  Note: Objective measures were completed at Evaluation unless otherwise noted.  DIAGNOSTIC FINDINGS:  Awaiting MRI resutls  PATIENT SURVEYS:  NDI 10/50   DASH 9.2/100 COGNITION: Overall cognitive status: Within functional limits for tasks assessed  SENSATION: Light touch: Impaired  R sensory loss > L Sensory loss in C7-T1 dermatomal pattern  POSTURE: rounded shoulders and increased lumbar lordosis  PALPATION:    CERVICAL ROM:   Active ROM A/PROM (deg) eval  Flexion 55  Extension 38 and painful  Right lateral flexion 20  Left lateral flexion 15  Right rotation 45  Left rotation 49   (Blank rows = not tested)  UPPER EXTREMITY ROM:  Active ROM Right eval Left eval  Shoulder flexion 130 160  Shoulder extension    Shoulder abduction 120 130  Shoulder adduction    Shoulder extension    Shoulder internal rotation    Shoulder external rotation    Elbow  flexion    Elbow extension    Wrist flexion    Wrist extension    Wrist ulnar deviation  Wrist radial deviation    Wrist pronation    Wrist supination     (Blank rows = not tested)  UPPER EXTREMITY MMT:  MMT Right eval Left eval  Shoulder flexion 4- 4-  Shoulder extension    Shoulder abduction 4 4  Shoulder adduction    Shoulder extension    Shoulder internal rotation    Shoulder external rotation 4- 3+  Middle trapezius 4- 4-  Lower trapezius 3- 3-  Elbow flexion 4 4  Elbow extension    Wrist flexion    Wrist extension    Wrist ulnar deviation    Wrist radial deviation    Wrist pronation    Wrist supination    Grip strength     (Blank rows = not tested)  CERVICAL SPECIAL TESTS:  Neck flexor muscle endurance test: Positive (13 seconds), Upper limb tension test (ULTT): Positive, and Distraction test: Positive  FUNCTIONAL TESTS:   TODAY'S TREATMENT:                                                                                                                              DATE:  02/02/2023  -Postural observation with shirt doffed- pt with R scapular depression compared to R shoulder.  -[employer] upright row #2 plate 3 x  12 -STM of R Upper trap with manual trigger point relief x 8' -Quadruped cervical retraction x 15 with 5' iso -Prone scapular retraction with shoulder extension 10x 3' iso  01/26/2023  -Neck Flexor Muscle endurance test: 21 seconds -STM Upper trapezius and bilateral scalenes -With bilateral scalene stretch passive opposite lateral flexion, same side rotation -Quadruped cervical retraction with theraband x 8 with cues for reduced thoracic compensation -lateral flexion scalene stretching 3x30'' bilaterally  01/12/2023  -Grip Strength: R: 85,75 ; L: 87, 95lbs Postural training -Scapular retraction 10x10' -Chin tucks 10x10' -Seated assist cervical rotation stretch with towel 5x5' bilaterally-edu on peripheralization vs centralization -Upper  Cervical Extension w/ towel 5 x 5' -Upper trap stretch bilaterally x 1' -Seated shoulder horizontal abduction with RTB x 12 -Seated shoulder ER with RTB x 12 for 3' -Prone horizontal abduction x 15 bilaterally -Deep neck flexor endurance 3x max time  12/29/2022 PT evaluation.    PATIENT EDUCATION:  Education details: PT Evaluation, findings, prognosis, frequency, attendance policy, and HEP if given.  Person educated: Patient Education method: Explanation Education comprehension: verbalized understanding  HOME EXERCISE PROGRAM: Access Code: [email or web address] URL: [email or web address] Date: 01/12/2023 Prepared by: Starling Manns  Exercises - Seated Scapular Retraction  - 1 x daily - 7 x weekly - 3 sets - 10 reps - Seated Cervical Retraction  - 1 x daily - 7 x weekly - 3 sets - 10 reps - Seated Assisted Cervical Rotation with Towel  - 1 x daily - 7 x weekly - 3 sets - 10 reps - Cervical Extension AROM with Strap  - 1 x daily - 7 x weekly - 3 sets - 10 reps -  Seated Cervical Sidebending Stretch  - 1 x daily - 7 x weekly - 3 sets - 10 reps - Seated Shoulder Horizontal Abduction with Resistance  - 1 x daily - 7 x weekly - 3 sets - 10 reps - Seated Shoulder Horizontal Abduction with Resistance  - 1 x daily - 7 x weekly - 3 sets - 10 reps - Prone Shoulder Horizontal Abduction with Thumbs Up  - 1 x daily - 7 x weekly - 3 sets - 10 reps - Supine Deep Neck Flexor Training - Repetitions  - 1 x daily - 7 x weekly - 3 sets - 10 reps  Access Code: [email or web address] URL: [email or web address] Date: 01/26/2023 Prepared by: Starling Manns  Exercises - Seated Scalene Stretch with Towel  - 1 x daily - 7 x weekly - 3 sets - 10 reps - Quadruped Cervical Retraction  - 1 x daily - 7 x weekly - 3 sets - 10 reps  ASSESSMENT:  CLINICAL IMPRESSION: Pt tolerating session well with focus on continued elongation of C spine. Pt continues to note intermittent R sided numbness into hand with  various interventions. Continuing to adjust and manipulate HEP to aide increasing neck ROM and strengthening intrinsic musculature. Pt will benefit from skilled Physical Therapy services to address deficits/limitations in order to improve functional and QOL. .   OBJECTIVE IMPAIRMENTS: decreased activity tolerance, decreased ROM, decreased strength, and pain.   ACTIVITY LIMITATIONS: carrying and lifting  PARTICIPATION LIMITATIONS: occupation  PERSONAL FACTORS: Age are also affecting patient's functional outcome.   REHAB POTENTIAL: Good  CLINICAL DECISION MAKING: Stable/uncomplicated  EVALUATION COMPLEXITY: Low   GOALS: Goals reviewed with patient? No  SHORT TERM GOALS: Target date: 01/12/2023  Pt will be independent with HEP in order to demonstrate participation in Physical Therapy POC.  Baseline: Goal status: IN PROGRESS  2.  Pt will report reduction in pain in bilateral hands to 5/10 during functional activity to demonstrate improved pain tolerance  Baseline:  Goal status: IN PROGRESS  LONG TERM GOALS: Target date: 01/27/2023  Pt will improve Deep Neck Flexor Endurance test by > 5 seconds in order to demonstrate improved functional strength to return to desired activities.  Baseline: See objective.  Goal status: IN PROGRESS  2.  Pt will improve Cervical ROM by 5 degrees in all directions in order to improve functional mobility during ADLs.  Baseline: See objective.  Goal status: IN PROGRESS  3.  Pt will report reduction in pain in bilateral hands to 4/10 during functional activity to demonstrate improved pain tolerance. Baseline: See objective.  Goal status: IN PROGRESS    PLAN:  PT FREQUENCY: 1x/week  PT DURATION: 4 weeks  PLANNED INTERVENTIONS: 97164- PT Re-evaluation, 97110-Therapeutic exercises, 97530- Therapeutic activity, 97112- Neuromuscular re-education, 97535- Self Care, 16109- Manual therapy, Joint mobilization, and Spinal mobilization  PLAN FOR NEXT  SESSION:  neck postural endurance, and periscapular strengthening.   Nelida Meuse PT, DPT Physical Therapist with Tomasa Hosteller [facility] 336 [phone] office   Nelida Meuse, PT 02/02/2023, 2:32 PM

## 2023-02-03 ENCOUNTER — Other Ambulatory Visit: Payer: Self-pay | Admitting: Cardiology

## 2023-02-09 ENCOUNTER — Encounter (HOSPITAL_COMMUNITY): Payer: Self-pay

## 2023-02-09 ENCOUNTER — Ambulatory Visit (HOSPITAL_COMMUNITY): Payer: Medicare HMO

## 2023-02-09 DIAGNOSIS — M5412 Radiculopathy, cervical region: Secondary | ICD-10-CM

## 2023-02-09 DIAGNOSIS — R202 Paresthesia of skin: Secondary | ICD-10-CM | POA: Diagnosis not present

## 2023-02-09 DIAGNOSIS — M542 Cervicalgia: Secondary | ICD-10-CM

## 2023-02-09 DIAGNOSIS — R200 Anesthesia of skin: Secondary | ICD-10-CM | POA: Diagnosis not present

## 2023-02-09 NOTE — Therapy (Signed)
OUTPATIENT PHYSICAL THERAPY CERVICAL TREATMENT   Patient Name: George Cooper MRN: [id number] DOB:1955-05-08, 67 y.o., male Today's Date: 02/09/2023  END OF SESSION:  PT End of Session - 02/09/23 1514     Visit Number 5    Date for PT Re-Evaluation 03/03/23    Authorization Type [employer]    Authorization Time Period Certification approved 4 visits from 12/3-1/10/25    Authorization - Visit Number 4    Progress Note Due on Visit 6    PT Start Time 1430    PT Stop Time 1512    PT Time Calculation (min) 42 min    Activity Tolerance Patient tolerated treatment well    Behavior During Therapy [facility] for tasks assessed/performed                 Past Medical History:  Diagnosis Date   Arthritis    "right knee" (09/18/2012)   Carotid artery disease (HCC)    Claudication (HCC)    a. 11/09/15 Hawk atherectomy of SFA   Dizziness    History of gout    Hyperlipidemia    Lyme disease 2006   "after bit by a tick" (09/18/2012)   Renal artery stenosis (HCC)    Tobacco abuse    discontinued October 2014   Past Surgical History:  Procedure Laterality Date   ABDOMINAL ANGIOGRAM  09/18/2012   Procedure: ABDOMINAL ANGIOGRAM;  Surgeon: Runell Gess, MD;  Location: [facility] CATH LAB;  Service: Cardiovascular;;   ABDOMINAL AORTOGRAM W/LOWER EXTREMITY N/A 06/17/2019   Procedure: ABDOMINAL AORTOGRAM W/LOWER EXTREMITY;  Surgeon: Runell Gess, MD;  Location: [facility] INVASIVE CV LAB;  Service: Cardiovascular;  Laterality: N/A;   ATHERECTOMY Right 09/18/2012   Procedure: ATHERECTOMY;  Surgeon: Runell Gess, MD;  Location: [facility] CATH LAB;  Service: Cardiovascular;  Laterality: Right;  right SFA   KNEE ARTHROSCOPY Right 1990's   "twice" (09/18/2012)   KNEE ARTHROSCOPY Left 1980's   LOWER EXTREMITY ANGIOGRAM N/A 09/18/2012   Procedure: LOWER EXTREMITY ANGIOGRAM;  Surgeon: Runell Gess, MD;  Location: [facility] CATH LAB;  Service: Cardiovascular;  Laterality: N/A;   PERCUTANEOUS STENT  INTERVENTION  09/18/2012   Procedure: PERCUTANEOUS STENT INTERVENTION;  Surgeon: Runell Gess, MD;  Location: [facility] CATH LAB;  Service: Cardiovascular;;  right SFA   PERIPHERAL ARTERIAL STENT GRAFT Right 09/18/2012   "PTA & stenting right SFA"/notes 09/18/2012   PERIPHERAL VASCULAR CATHETERIZATION N/A 11/09/2015   Procedure: Abdominal Aortogram w/Lower Extremity;  Surgeon: Runell Gess, MD;  Location: [facility] INVASIVE CV LAB;  Service: Cardiovascular;  Laterality: N/A;   PERIPHERAL VASCULAR CATHETERIZATION Right 11/09/2015   Procedure: Peripheral Vascular Balloon Angioplasty;  Surgeon: Runell Gess, MD;  Location: [facility] INVASIVE CV LAB;  Service: Cardiovascular;  Laterality: Right;  SFA   PERIPHERAL VASCULAR CATHETERIZATION Right 11/09/2015   Procedure: Peripheral Vascular Atherectomy;  Surgeon: Runell Gess, MD;  Location[facility] INVASIVE CV LAB;  Service: Cardiovascular;  Laterality: Right;  SFA   PERIPHERAL VASCULAR INTERVENTION Right 06/17/2019   Procedure: PERIPHERAL VASCULAR INTERVENTION;  Surgeon: Runell Gess, MD;  Location: [facility] INVASIVE CV LAB;  Service: Cardiovascular;  Laterality: Right;  superficial femoral   Patient Active Problem List   Diagnosis Date Noted   Claudication in peripheral vascular disease (HCC) 06/17/2019   Essential hypertension 02/26/2019   Cerebral infarction (HCC) 11/16/2015   Left-sided weakness 11/16/2015   Headache 11/16/2015   Hyperlipidemia 10/10/2012   Pain of right thigh 09/21/2012   PAD (peripheral artery disease),  s/p athrectomy, PTA/ stent Rt. SFA  09/18/2012 09/19/2012   Carotid artery disease (HCC) 08/01/2012   Dizziness 08/01/2012   Claudication (HCC)     PCP: Assunta Found MD  REFERRING PROVIDER: Earley Abide, MD  REFERRING DIAG:  M54.2 (ICD-10-CM) - Neck pain  M54.12 (ICD-10-CM) - Cervical radiculopathy  R20.0 (ICD-10-CM) - Hand numbness    THERAPY DIAG:  Neck pain  Numbness and tingling in both hands  Cervical  radiculopathy  Rationale for Evaluation and Treatment: Rehabilitation  ONSET DATE: Around 5 months ago  SUBJECTIVE:                                                                                                                                                                                                         SUBJECTIVE STATEMENT: Pt report new onset of left sided arm numbness from shoulder to distal wrist in LUE.[name]   Hand dominance: Right  PERTINENT HISTORY:  Nerve Conduction study R more severe than L hand.  PAIN:  Are you having pain? Yes: NPRS scale: 7 (hands) 2 (neck)/10 Pain location: primarily in hands Pain description: numbness Aggravating factors: Laying down Relieving factors: Walking around and getting.   PRECAUTIONS: None  RED FLAGS: None     WEIGHT BEARING RESTRICTIONS: No  FALLS:  Has patient fallen in last 6 months? No   OCCUPATION: semi retired Personnel officer  PLOF: Independent  PATIENT GOALS: Get MRI  NEXT MD VISIT: Dependent upon PT POC  OBJECTIVE:  Note: Objective measures were completed at Evaluation unless otherwise noted.  DIAGNOSTIC FINDINGS:  Awaiting MRI resutls  PATIENT SURVEYS:  NDI 10/50   DASH 9.2/100 COGNITION: Overall cognitive status: Within functional limits for tasks assessed  SENSATION: Light touch: Impaired  R sensory loss > L Sensory loss in C7-T1 dermatomal pattern  POSTURE: rounded shoulders and increased lumbar lordosis  PALPATION:    CERVICAL ROM:   Active ROM A/PROM (deg) eval  Flexion 55  Extension 38 and painful  Right lateral flexion 20  Left lateral flexion 15  Right rotation 45  Left rotation 49   (Blank rows = not tested)  UPPER EXTREMITY ROM:  Active ROM Right eval Left eval  Shoulder flexion 130 160  Shoulder extension    Shoulder abduction 120 130  Shoulder adduction    Shoulder extension    Shoulder internal rotation    Shoulder external rotation    Elbow flexion    Elbow  extension    Wrist flexion    Wrist extension    Wrist ulnar deviation  Wrist radial deviation    Wrist pronation    Wrist supination     (Blank rows = not tested)  UPPER EXTREMITY MMT:  MMT Right eval Left eval  Shoulder flexion 4- 4-  Shoulder extension    Shoulder abduction 4 4  Shoulder adduction    Shoulder extension    Shoulder internal rotation    Shoulder external rotation 4- 3+  Middle trapezius 4- 4-  Lower trapezius 3- 3-  Elbow flexion 4 4  Elbow extension    Wrist flexion    Wrist extension    Wrist ulnar deviation    Wrist radial deviation    Wrist pronation    Wrist supination    Grip strength     (Blank rows = not tested)  CERVICAL SPECIAL TESTS:  Neck flexor muscle endurance test: Positive (13 seconds), Upper limb tension test (ULTT): Positive, and Distraction test: Positive  FUNCTIONAL TESTS:   TODAY'S TREATMENT:                                                                                                                              DATE:  02/09/2023  -15' Cervical Traction machine for 60 seconds traction; 30 seconds section. Max weight 20 lbs; 5lb rest -Supine serratus punches 2 x 1' -Quadruped cervical retraction x30 with RTB -Education regarding HEP and POC plan.   02/02/2023  -Postural observation with shirt doffed- pt with R scapular depression compared to R shoulder.  -Bodycraft upright row #2 plate 3 x  12 -STM of R Upper trap with manual trigger point relief x 8' -Quadruped cervical retraction x 15 with 5' iso -Prone scapular retraction with shoulder extension 10x 3' iso  01/26/2023  -Neck Flexor Muscle endurance test: 21 seconds -STM Upper trapezius and bilateral scalenes -With bilateral scalene stretch passive opposite lateral flexion, same side rotation -Quadruped cervical retraction with theraband x 8 with cues for reduced thoracic compensation -lateral flexion scalene stretching 3x30'' bilaterally  PATIENT EDUCATION:   Education details: PT Evaluation, findings, prognosis, frequency, attendance policy, and HEP if given.  Person educated: Patient Education method: Explanation Education comprehension: verbalized understanding  HOME EXERCISE PROGRAM: Access Code: [email or web address] URL: [email or web address] Date: 01/12/2023 Prepared by: Starling Manns  Exercises - Seated Scapular Retraction  - 1 x daily - 7 x weekly - 3 sets - 10 reps - Seated Cervical Retraction  - 1 x daily - 7 x weekly - 3 sets - 10 reps - Seated Assisted Cervical Rotation with Towel  - 1 x daily - 7 x weekly - 3 sets - 10 reps - Cervical Extension AROM with Strap  - 1 x daily - 7 x weekly - 3 sets - 10 reps - Seated Cervical Sidebending Stretch  - 1 x daily - 7 x weekly - 3 sets - 10 reps - Seated Shoulder Horizontal Abduction with Resistance  - 1 x daily - 7 x weekly - 3 sets - 10 reps -  Seated Shoulder Horizontal Abduction with Resistance  - 1 x daily - 7 x weekly - 3 sets - 10 reps - Prone Shoulder Horizontal Abduction with Thumbs Up  - 1 x daily - 7 x weekly - 3 sets - 10 reps - Supine Deep Neck Flexor Training - Repetitions  - 1 x daily - 7 x weekly - 3 sets - 10 reps  Access Code: [email or web address] URL: [email or web address] Date: 01/26/2023 Prepared by: Starling Manns  Exercises - Seated Scalene Stretch with Towel  - 1 x daily - 7 x weekly - 3 sets - 10 reps - Quadruped Cervical Retraction  - 1 x daily - 7 x weekly - 3 sets - 10 reps  ASSESSMENT:  CLINICAL IMPRESSION: No progress being made with pt's goals and status. Pt reporting new onset of numbness in LUE and continues with grip weakness bilaterally. Added mechanical traction this week, little to no changes in symptoms, pt reports mild general 'good' feeling but no reduction in peripheralization of symptoms. Pt will benefit from skilled Physical Therapy services to address deficits/limitations in order to improve functional and QOL. .   OBJECTIVE  IMPAIRMENTS: decreased activity tolerance, decreased ROM, decreased strength, and pain.   ACTIVITY LIMITATIONS: carrying and lifting  PARTICIPATION LIMITATIONS: occupation  PERSONAL FACTORS: Age are also affecting patient's functional outcome.   REHAB POTENTIAL: Good  CLINICAL DECISION MAKING: Stable/uncomplicated  EVALUATION COMPLEXITY: Low   GOALS: Goals reviewed with patient? No  SHORT TERM GOALS: Target date: 01/12/2023  Pt will be independent with HEP in order to demonstrate participation in Physical Therapy POC.  Baseline: Goal status: IN PROGRESS  2.  Pt will report reduction in pain in bilateral hands to 5/10 during functional activity to demonstrate improved pain tolerance  Baseline:  Goal status: IN PROGRESS  LONG TERM GOALS: Target date: 01/27/2023  Pt will improve Deep Neck Flexor Endurance test by > 5 seconds in order to demonstrate improved functional strength to return to desired activities.  Baseline: See objective.  Goal status: IN PROGRESS  2.  Pt will improve Cervical ROM by 5 degrees in all directions in order to improve functional mobility during ADLs.  Baseline: See objective.  Goal status: IN PROGRESS  3.  Pt will report reduction in pain in bilateral hands to 4/10 during functional activity to demonstrate improved pain tolerance. Baseline: See objective.  Goal status: IN PROGRESS    PLAN:  PT FREQUENCY: 1x/week  PT DURATION: 4 weeks  PLANNED INTERVENTIONS: 97164- PT Re-evaluation, 97110-Therapeutic exercises, 97530- Therapeutic activity, [id number]- Neuromuscular re-education, 97535- Self Care, 82956- Manual therapy, 97012- Traction (mechanical), Joint mobilization, and Spinal mobilization  PLAN FOR NEXT SESSION:  neck postural endurance, and periscapular strengthening.   Nelida Meuse PT, DPT Physical Therapist with [facility] [phone] office   Nelida Meuse, PT 02/09/2023, 3:16 PM

## 2023-02-14 ENCOUNTER — Other Ambulatory Visit: Payer: Self-pay | Admitting: Cardiovascular Disease

## 2023-02-23 ENCOUNTER — Ambulatory Visit (HOSPITAL_COMMUNITY): Payer: Medicare HMO | Attending: Diagnostic Neuroimaging

## 2023-02-23 ENCOUNTER — Encounter (HOSPITAL_COMMUNITY): Payer: Self-pay

## 2023-02-23 DIAGNOSIS — M5412 Radiculopathy, cervical region: Secondary | ICD-10-CM | POA: Insufficient documentation

## 2023-02-23 DIAGNOSIS — M542 Cervicalgia: Secondary | ICD-10-CM | POA: Insufficient documentation

## 2023-02-23 DIAGNOSIS — R200 Anesthesia of skin: Secondary | ICD-10-CM | POA: Insufficient documentation

## 2023-02-23 DIAGNOSIS — R202 Paresthesia of skin: Secondary | ICD-10-CM | POA: Insufficient documentation

## 2023-02-23 NOTE — Therapy (Signed)
 [facility] Rehabilitation at [facility] [address] [place], [place], [address] Phone: [phone]   Fax:  [phone]  Patient Details  Name: George Cooper MRN: [id number] Date of Birth: 1955-12-30 Referring Provider:  No ref. provider found  Encounter Date: 02/23/2023  Called Regarding pt's 1st no show. Discussed last schedule appointment and further update regarding insurance authorization for MRI. Informed pt to call back on voicemail.   Omega JONETTA Bottcher, PT 02/23/2023, 2:13 PM  [facility] Outpatient Rehabilitation at [facility] [address] [place], [place], [address] Phone: [phone]   Fax:  [phone]

## 2023-02-28 ENCOUNTER — Ambulatory Visit (HOSPITAL_COMMUNITY): Payer: Medicare HMO

## 2023-02-28 ENCOUNTER — Encounter (HOSPITAL_COMMUNITY): Payer: Self-pay

## 2023-02-28 DIAGNOSIS — R202 Paresthesia of skin: Secondary | ICD-10-CM

## 2023-02-28 DIAGNOSIS — M5412 Radiculopathy, cervical region: Secondary | ICD-10-CM

## 2023-02-28 DIAGNOSIS — M542 Cervicalgia: Secondary | ICD-10-CM | POA: Diagnosis not present

## 2023-02-28 DIAGNOSIS — R200 Anesthesia of skin: Secondary | ICD-10-CM | POA: Diagnosis not present

## 2023-02-28 NOTE — Therapy (Signed)
 OUTPATIENT PHYSICAL THERAPY CERVICAL TREATMENT PHYSICAL THERAPY DISCHARGE SUMMARY  Visits from Start of Care: 6  Current functional level related to goals / functional outcomes: See below- improved strength and ROM   Remaining deficits: Numbness, tingling, and pain consistently.    Education / Equipment:    Patient agrees to discharge. Patient goals were partially met. Patient is being discharged due to did not respond to therapy.   Patient Name: George Cooper MRN: [id number] DOB:[birth year], 68 y.o., male Today's Date: 02/28/2023  END OF SESSION:  PT End of Session - 02/28/23 1045     Visit Number 6    Date for PT Re-Evaluation 02/28/23    Authorization Type [employer]    Authorization Time Period Certification approved 4 visits from 12/3-1/10/25    Authorization - Visit Number 5    Progress Note Due on Visit 5    PT Start Time 1023    PT Stop Time 1045    PT Time Calculation (min) 22 min    Activity Tolerance Patient tolerated treatment well    Behavior During Therapy [facility] for tasks assessed/performed                  Past Medical History:  Diagnosis Date   Arthritis    right knee (09/18/2012)   Carotid artery disease (HCC)    Claudication (HCC)    a. 11/09/15 Hawk atherectomy of SFA   Dizziness    History of gout    Hyperlipidemia    Lyme disease 2006   after bit by a tick (09/18/2012)   Renal artery stenosis (HCC)    Tobacco abuse    discontinued October 2014   Past Surgical History:  Procedure Laterality Date   ABDOMINAL ANGIOGRAM  09/18/2012   Procedure: ABDOMINAL ANGIOGRAM;  Surgeon: Cooper George Lesches, MD;  Location: [facility] CATH LAB;  Service: Cardiovascular;;   ABDOMINAL AORTOGRAM W/LOWER EXTREMITY N/A 06/17/2019   Procedure: ABDOMINAL AORTOGRAM W/LOWER EXTREMITY;  Surgeon: Lesches Cooper JINNY, MD;  Location: [facility] INVASIVE CV LAB;  Service: Cardiovascular;  Laterality: N/A;   ATHERECTOMY Right 09/18/2012   Procedure: ATHERECTOMY;  Surgeon:  Cooper George Lesches, MD;  Location: [facility] CATH LAB;  Service: Cardiovascular;  Laterality: Right;  right SFA   KNEE ARTHROSCOPY Right 1990's   twice (09/18/2012)   KNEE ARTHROSCOPY Left 1980's   LOWER EXTREMITY ANGIOGRAM N/A 09/18/2012   Procedure: LOWER EXTREMITY ANGIOGRAM;  Surgeon: Cooper George Lesches, MD;  Location: [facility] CATH LAB;  Service: Cardiovascular;  Laterality: N/A;   PERCUTANEOUS STENT INTERVENTION  09/18/2012   Procedure: PERCUTANEOUS STENT INTERVENTION;  Surgeon: Cooper George Lesches, MD;  Location: [facility] CATH LAB;  Service: Cardiovascular;;  right SFA   PERIPHERAL ARTERIAL STENT GRAFT Right 09/18/2012   PTA & stenting right SFA/notes 09/18/2012   PERIPHERAL VASCULAR CATHETERIZATION N/A 11/09/2015   Procedure: Abdominal Aortogram w/Lower Extremity;  Surgeon: Cooper George Lesches, MD;  Location: [facility] INVASIVE CV LAB;  Service: Cardiovascular;  Laterality: N/A;   PERIPHERAL VASCULAR CATHETERIZATION Right 11/09/2015   Procedure: Peripheral Vascular Balloon Angioplasty;  Surgeon: Cooper George Lesches, MD;  Location: [facility] INVASIVE CV LAB;  Service: Cardiovascular;  Laterality: Right;  SFA   PERIPHERAL VASCULAR CATHETERIZATION Right 11/09/2015   Procedure: Peripheral Vascular Atherectomy;  Surgeon: Cooper George Lesches, MD;  Location: [facility] INVASIVE CV LAB;  Service: Cardiovascular;  Laterality: Right;  SFA   PERIPHERAL VASCULAR INTERVENTION Right 06/17/2019   Procedure: PERIPHERAL VASCULAR INTERVENTION;  Surgeon: Lesches Cooper JINNY, MD;  Location: [facility] INVASIVE CV LAB;  Service: Cardiovascular;  Laterality: Right;  superficial femoral   Patient Active Problem List   Diagnosis Date Noted   Claudication in peripheral vascular disease (HCC) 06/17/2019   Essential hypertension 02/26/2019   Cerebral infarction (HCC) 11/16/2015   Left-sided weakness 11/16/2015   Headache 11/16/2015   Hyperlipidemia 10/10/2012   Pain of right thigh 09/21/2012   PAD (peripheral artery disease), s/p athrectomy, PTA/ stent Rt. SFA  09/18/2012  09/19/2012   Carotid artery disease (HCC) 08/01/2012   Dizziness 08/01/2012   Claudication (HCC)     PCP: George Rush MD  REFERRING PROVIDER: Margaret Myron SAUNDERS, MD  REFERRING DIAG:  M54.2 (ICD-10-CM) - Neck pain  M54.12 (ICD-10-CM) - Cervical radiculopathy  R20.0 (ICD-10-CM) - Hand numbness    THERAPY DIAG:  Neck pain  Numbness and tingling in both hands  Cervical radiculopathy  Rationale for Evaluation and Treatment: Rehabilitation  ONSET DATE: Around 5 months ago  SUBJECTIVE:                                                                                                                                                                                                         SUBJECTIVE STATEMENT: Pt report new onset of left sided arm numbness from shoulder to distal wrist in LUE.George Cooper   Hand dominance: Right  PERTINENT HISTORY:  Nerve Conduction study R more severe than L hand.  PAIN:  Are you having pain? Yes: NPRS scale: 7 (hands) 2 (neck)/10 Pain location: primarily in hands Pain description: numbness Aggravating factors: Laying down Relieving factors: Walking around and getting.   PRECAUTIONS: None  RED FLAGS: None     WEIGHT BEARING RESTRICTIONS: No  FALLS:  Has patient fallen in last 6 months? No   OCCUPATION: semi retired personnel officer  PLOF: Independent  PATIENT GOALS: Get MRI  NEXT MD VISIT: Dependent upon PT POC  OBJECTIVE:  Note: Objective measures were completed at Evaluation unless otherwise noted.  DIAGNOSTIC FINDINGS:  Awaiting MRI resutls  PATIENT SURVEYS:  NDI 10/50   DASH 9.2/100    COGNITION: Overall cognitive status: Within functional limits for tasks assessed  SENSATION: Light touch: Impaired  R sensory loss > L Sensory loss in C7-T1 dermatomal pattern  POSTURE: rounded shoulders and increased lumbar lordosis  PALPATION:    CERVICAL ROM:   Active ROM A/PROM (deg) eval AROM 02/28/2023  Flexion 55 65  Extension  38 and painful 42  Right lateral flexion 20 34  Left lateral flexion 15 19  Right rotation 45 59  Left rotation 49 55   (Blank rows = not  tested)  UPPER EXTREMITY ROM:  Active ROM Right eval Left eval  Shoulder flexion 130 160  Shoulder extension    Shoulder abduction 120 130  Shoulder adduction    Shoulder extension    Shoulder internal rotation    Shoulder external rotation    Elbow flexion    Elbow extension    Wrist flexion    Wrist extension    Wrist ulnar deviation    Wrist radial deviation    Wrist pronation    Wrist supination     (Blank rows = not tested)  UPPER EXTREMITY MMT:  MMT Right eval Left eval 02/28/2023 Re-evaluation Right 02/28/2023 Re-evaluation Left  Shoulder flexion 4- 4- 4+ 4+  Shoulder extension      Shoulder abduction 4 4 4+ 4+  Shoulder adduction      Shoulder extension      Shoulder internal rotation      Shoulder external rotation 4- 3+ 4+ 4  Middle trapezius 4- 4- 4 4  Lower trapezius 3- 3- 3+ 3+  Elbow flexion 4 4    Elbow extension      Wrist flexion      Wrist extension      Wrist ulnar deviation      Wrist radial deviation      Wrist pronation      Wrist supination      Grip strength       (Blank rows = not tested)  CERVICAL SPECIAL TESTS:  Neck flexor muscle endurance test: Positive (13 seconds), Upper limb tension test (ULTT): Positive, and Distraction test: Positive  FUNCTIONAL TESTS:   TODAY'S TREATMENT:                                                                                                                              DATE:  02/28/2023  -DASH: 10.8/100 -NDI: 11/50 -MMT -Deep neck flexion endurance test: 27 seconds -ROM  02/09/2023  -15' Cervical Traction machine for 60 seconds traction; 30 seconds section. Max weight 20 lbs; 5lb rest -Supine serratus punches 2 x 1' -Quadruped cervical retraction x30 with RTB -Education regarding HEP and POC plan.   02/02/2023  -Postural observation with shirt doffed-  pt with R scapular depression compared to R shoulder.  -[place] upright row #2 plate 3 x  12 -STM of R Upper trap with manual trigger point relief x 8' -Quadruped cervical retraction x 15 with 5' iso -Prone scapular retraction with shoulder extension 10x 3' iso  PATIENT EDUCATION:  Education details: PT Evaluation, findings, prognosis, frequency, attendance policy, and HEP if given.  Person educated: Patient Education method: Explanation Education comprehension: verbalized understanding  HOME EXERCISE PROGRAM: Access Code: [email or web address] URL: [email or web address] Date: 01/12/2023 Prepared by: Omega Bottcher  Exercises - Seated Scapular Retraction  - 1 x daily - 7 x weekly - 3 sets - 10 reps - Seated Cervical Retraction  - 1 x daily - 7 x weekly - 3 sets -  10 reps - Seated Assisted Cervical Rotation with Towel  - 1 x daily - 7 x weekly - 3 sets - 10 reps - Cervical Extension AROM with Strap  - 1 x daily - 7 x weekly - 3 sets - 10 reps - Seated Cervical Sidebending Stretch  - 1 x daily - 7 x weekly - 3 sets - 10 reps - Seated Shoulder Horizontal Abduction with Resistance  - 1 x daily - 7 x weekly - 3 sets - 10 reps - Seated Shoulder Horizontal Abduction with Resistance  - 1 x daily - 7 x weekly - 3 sets - 10 reps - Prone Shoulder Horizontal Abduction with Thumbs Up  - 1 x daily - 7 x weekly - 3 sets - 10 reps - Supine Deep Neck Flexor Training - Repetitions  - 1 x daily - 7 x weekly - 3 sets - 10 reps  Access Code: [email or web address] URL: [email or web address] Date: 01/26/2023 Prepared by: Omega Bottcher  Exercises - Seated [place] Stretch with Towel  - 1 x daily - 7 x weekly - 3 sets - 10 reps - Quadruped Cervical Retraction  - 1 x daily - 7 x weekly - 3 sets - 10 reps  ASSESSMENT:  CLINICAL IMPRESSION: Pt was discharged today as it was re-evaluation and pt has made strength and ROM progress but no changes in his symptoms and reports high pain levels to date. No  progress made in reduction of neuropathic symptoms and noticeable outcome measures decreased and worsened in function. Pt is being discharged from skilled physical therapy services at this time.  OBJECTIVE IMPAIRMENTS: decreased activity tolerance, decreased ROM, decreased strength, and pain.   ACTIVITY LIMITATIONS: carrying and lifting  PARTICIPATION LIMITATIONS: occupation  PERSONAL FACTORS: Age are also affecting patient's functional outcome.   REHAB POTENTIAL: Good  CLINICAL DECISION MAKING: Stable/uncomplicated  EVALUATION COMPLEXITY: Low   GOALS: Goals reviewed with patient? No  SHORT TERM GOALS: Target date: 01/12/2023  Pt will be independent with HEP in order to demonstrate participation in Physical Therapy POC.  Baseline: Goal status: MET  2.  Pt will report reduction in pain in bilateral hands to 5/10 during functional activity to demonstrate improved pain tolerance  Baseline:  Goal status: NOT MET  LONG TERM GOALS: Target date: 01/27/2023  Pt will improve Deep Neck Flexor Endurance test by > 5 seconds in order to demonstrate improved functional strength to return to desired activities.  Baseline: See objective.  Goal status: MET  2.  Pt will improve Cervical ROM by 5 degrees in all directions in order to improve functional mobility during ADLs.  Baseline: See objective.  Goal status: MET  3.  Pt will report reduction in pain in bilateral hands to 4/10 during functional activity to demonstrate improved pain tolerance. Baseline: See objective.  Goal status: NOT MET    PLAN:  PT FREQUENCY: 1x/week  PT DURATION: 4 weeks  PLANNED INTERVENTIONS: 97164- PT Re-evaluation, 97110-Therapeutic exercises, 97530- Therapeutic activity, [id number]- Neuromuscular re-education, 97535- Self Care, 02859- Manual therapy, 97012- Traction (mechanical), Joint mobilization, and Spinal mobilization  PLAN FOR NEXT SESSION:  neck postural endurance, and periscapular strengthening.    Omega JONETTA Bottcher PT, DPT Physical Therapist with [facility] [phone] office   Omega JONETTA Bottcher, PT 02/28/2023, 10:46 AM

## 2023-03-05 ENCOUNTER — Other Ambulatory Visit: Payer: Self-pay | Admitting: Cardiovascular Disease

## 2023-04-03 ENCOUNTER — Encounter: Payer: Self-pay | Admitting: Diagnostic Neuroimaging

## 2023-04-03 NOTE — Telephone Encounter (Signed)
 The patient called to schedule this MRI. I will resubmit the PA with the PT notes. He can schedule at GI [phone]

## 2023-04-04 ENCOUNTER — Encounter: Payer: Self-pay | Admitting: Diagnostic Neuroimaging

## 2023-04-04 NOTE — Telephone Encounter (Signed)
He is scheduled at [facility] and they are taking care of the PA.

## 2023-04-05 ENCOUNTER — Encounter: Payer: Self-pay | Admitting: Diagnostic Neuroimaging

## 2023-04-06 ENCOUNTER — Encounter: Payer: Self-pay | Admitting: Diagnostic Neuroimaging

## 2023-04-07 ENCOUNTER — Encounter: Payer: Self-pay | Admitting: Diagnostic Neuroimaging

## 2023-04-07 DIAGNOSIS — Z1331 Encounter for screening for depression: Secondary | ICD-10-CM | POA: Diagnosis not present

## 2023-04-07 DIAGNOSIS — G459 Transient cerebral ischemic attack, unspecified: Secondary | ICD-10-CM | POA: Diagnosis not present

## 2023-04-07 DIAGNOSIS — E782 Mixed hyperlipidemia: Secondary | ICD-10-CM | POA: Diagnosis not present

## 2023-04-07 DIAGNOSIS — Z0001 Encounter for general adult medical examination with abnormal findings: Secondary | ICD-10-CM | POA: Diagnosis not present

## 2023-04-07 DIAGNOSIS — R7303 Prediabetes: Secondary | ICD-10-CM | POA: Diagnosis not present

## 2023-04-07 DIAGNOSIS — Z6825 Body mass index (BMI) 25.0-25.9, adult: Secondary | ICD-10-CM | POA: Diagnosis not present

## 2023-04-14 ENCOUNTER — Ambulatory Visit
Admission: RE | Admit: 2023-04-14 | Discharge: 2023-04-14 | Disposition: A | Discharge status: Home / Self Care | Payer: Medicare HMO | Source: Ambulatory Visit | Attending: Diagnostic Neuroimaging | Admitting: Diagnostic Neuroimaging

## 2023-04-14 DIAGNOSIS — M542 Cervicalgia: Secondary | ICD-10-CM | POA: Diagnosis not present

## 2023-04-14 DIAGNOSIS — R200 Anesthesia of skin: Secondary | ICD-10-CM | POA: Diagnosis not present

## 2023-04-14 DIAGNOSIS — M5412 Radiculopathy, cervical region: Secondary | ICD-10-CM

## 2023-05-02 ENCOUNTER — Telehealth: Payer: Self-pay | Admitting: *Deleted

## 2023-05-02 ENCOUNTER — Telehealth: Payer: Self-pay | Admitting: Diagnostic Neuroimaging

## 2023-05-02 DIAGNOSIS — I2510 Atherosclerotic heart disease of native coronary artery without angina pectoris: Secondary | ICD-10-CM | POA: Diagnosis not present

## 2023-05-02 DIAGNOSIS — Z7902 Long term (current) use of antithrombotics/antiplatelets: Secondary | ICD-10-CM | POA: Diagnosis not present

## 2023-05-02 DIAGNOSIS — Z5181 Encounter for therapeutic drug level monitoring: Secondary | ICD-10-CM | POA: Diagnosis not present

## 2023-05-02 DIAGNOSIS — Z8673 Personal history of transient ischemic attack (TIA), and cerebral infarction without residual deficits: Secondary | ICD-10-CM | POA: Diagnosis not present

## 2023-05-02 DIAGNOSIS — Z1211 Encounter for screening for malignant neoplasm of colon: Secondary | ICD-10-CM | POA: Diagnosis not present

## 2023-05-02 DIAGNOSIS — I739 Peripheral vascular disease, unspecified: Secondary | ICD-10-CM | POA: Diagnosis not present

## 2023-05-02 NOTE — Telephone Encounter (Signed)
   Name: George Cooper  DOB: [birth year]  MRN: [id number]  Primary Cardiologist: Nanetta Batty, MD  Chart reviewed as part of pre-operative protocol coverage.  Patient last seen 12/23/2021.    Because of George Cooper's past medical history and time since last visit, he will require a follow-up in-office visit in order to better assess preoperative cardiovascular risk.  Pre-op covering staff: - Please schedule appointment and call patient to inform them. If patient already had an upcoming appointment within acceptable timeframe, please add "pre-op clearance" to the appointment notes so provider is aware. - Please contact requesting surgeon's office via preferred method (i.e, phone, fax) to inform them of need for appointment prior to surgery.  Note will be removed from preop pool.  Please route back to P CV DIV PREOP if needed. Tereso Newcomer, PA-C  05/02/2023, 5:20 PM

## 2023-05-02 NOTE — Telephone Encounter (Signed)
 Dr Marjory Lies has not yet reviewed the results. I will route call to him to have him review and result for the patient.

## 2023-05-02 NOTE — Telephone Encounter (Signed)
   Pre-operative Risk Assessment    Patient Name: George Cooper  DOB: 1955-04-25 MRN: [id number]   Date of last office visit: 12/2021 Date of next office visit: NONE    Request for Surgical Clearance    Procedure:   COLONOSCOPY  Date of Surgery:  Clearance 05/24/23                                Surgeon:  DR. PARAG BRAHMBHATT Surgeon's Group or Practice Name:  [facility] Phone number:  [phone] Fax number:  [phone]   Type of Clearance Requested:   - Medical  - Pharmacy:  Hold Clopidogrel (Plavix) X'S 5 DAYS   Type of Anesthesia:   PROPOFOL   Additional requests/questions:    [name]   05/02/2023, 3:55 PM

## 2023-05-02 NOTE — Telephone Encounter (Signed)
 Patient checking on MRI results done 04/14/23. Would like a call back.

## 2023-05-03 ENCOUNTER — Telehealth: Payer: Self-pay | Admitting: Diagnostic Neuroimaging

## 2023-05-03 ENCOUNTER — Other Ambulatory Visit: Payer: Self-pay | Admitting: *Deleted

## 2023-05-03 DIAGNOSIS — M542 Cervicalgia: Secondary | ICD-10-CM

## 2023-05-03 DIAGNOSIS — M5412 Radiculopathy, cervical region: Secondary | ICD-10-CM

## 2023-05-03 DIAGNOSIS — R200 Anesthesia of skin: Secondary | ICD-10-CM

## 2023-05-03 NOTE — Telephone Encounter (Signed)
 Pt has been scheduled to see Marjie Skiff, [facility] 05/11/23 @ [facility] office. Pt thanked me for the call. I will update all parties involved.

## 2023-05-03 NOTE — Telephone Encounter (Signed)
 Referral sent to [facility]; phone # [phone], fax # [phone].

## 2023-05-03 NOTE — Progress Notes (Signed)
 Cardiology Office Note:    Date:  05/11/2023   ID:  George Cooper, DOB 03/06/1955, MRN [id number]  PCP:  Assunta Found, MD  Cardiologist:  Nanetta Batty, MD     Referring MD: Assunta Found, MD   Chief Complaint: pre-op evaluation of colonoscopy   History of Present Illness:    George Cooper is a 68 y.o. male with a history of PAD s/p multiple interventions to right SFA (most recently in 05/2019), carotid artery disease, renal artery stenosis, hypertension, hyperlipidemia, and gout who is followed by Dr. Allyson Sabal and presents today for pre-op evaluation.   Patient has a long history of PAD involving the right SFA. He underwent directional atherectomy, PTA, and stenting of proximal right SFA in 08/2012 and then directional atherectomy and drug-eluting balloon angioplasty distal right SFA in 10/2015. He had recurrent claudication in 2021 and ABIs showed a drop in his ABI on the right. Peripheral angiography in 05/2019 showed 95% in-stent restenosis of distal SFA which was re-stented with a self-expanding drug-coated stent. Also showed 50-60% ostial/proximal left common iliac artery stenosis with three-vessel runoff which was treated medically. He also has known renal artery stenosis with 80% stenosis of right renal artery noted on peripheral angiogram in 08/2012 and mild carotid artery disease.   Prior Myoview in 2014 was low risk with findings suggestive of an inferoseptal scar but no reversible ischemia. Last Echo in 2017 showed LVEF of 60-65% with normal wall motion and grade 1 diastolic dysfunction.  He was last seen by Dr. Allyson Sabal in 12/2021 at which time report mild claudication in right calf when walking brisk long distances but this was not lifestyle limiting. He denies any chest pain or shortness of breath.   Most recent lower extremity dopplers in 12/2022 which showed moderate atherosclerosis throughout right extremity with mild progression of stenosis within proximal to mid stent. ABIs  and TBIs were normal bilaterally.   Patient presents today for pre-op evaluation for upcoming colonoscopy. He is doing very well from a cardiac standpoint. He denies any chest pain, shortness of breath, orthopnea, PND, edema, palpitations, lightheadedness, dizziness, syncope, or claudication. He is due for a colonoscopy. He also states he is likely going to need cervical spine surgery due to a pinched nerve in his neck causing numbness in his hands. He stays very active. He does 100 push ups every day and push mows his yard. He also has an active job as an Personnel officer. He denies any anginal symptoms with any of this.   EKGs/Labs/Other Studies Reviewed:    The following studies were reviewed:  Myoview 09/14/2012: Overall Impression:   Low risk stress nuclear study with evidence of an inferoseptal scar. There is no evidence of reversible ischemia.  _______________  Echocardiogram 11/17/2015: Study Conclusions: - Left ventricle: The cavity size was normal. Wall thickness was    increased in a pattern of mild LVH. Systolic function was normal.    The estimated ejection fraction was in the range of 60% to 65%.    Wall motion was normal; there were no regional wall motion    abnormalities. Doppler parameters are consistent with abnormal    left ventricular relaxation (grade 1 diastolic dysfunction).    Doppler parameters are consistent with high ventricular filling    pressure.  - Aortic valve: Mildly to moderately calcified annulus. Trileaflet.  - Mitral valve: Mildly thickened leaflets .  - Atrial septum: No defect or patent foramen ovale was identified.  _______________  Peripheral Angiogram 06/17/2019:  Aniographic Data: 1: Abdominal aorta-mildly atherosclerotic abdominal aorta 2: Left lower extremity-50-60% ostial/proximal left common iliac artery stenosis with three-vessel runoff 3: Right lower extremity-95% "in-stent restenosis within the distal portion of the previously placed mid right  SFA stent.  This appeared to be intraluminal filling defect.  There was three-vessel runoff  Impression: [name] has a 95% intraluminal filling defect contributing to his high-frequency signal and right calf claudication. We will proceed with PTA and restenting using distal protection   Final Impression: Successful mid right SFA shockable angioplasty/drug coated restenting using spider distal protection in the setting of lifestyle limiting claudication. There was a large fibrointimal filling defect that dislodged during the procedure and was successfully retrieved using a spider distal protection device. The patient be hydrated overnight, discharged home in the morning. We will get lower extremity arterial Doppler studies no [place] office next week and I will see him back 1 to 2 weeks thereafter.  _______________  Carotid Dopplers 09/30/2022: Summary:  - Right Carotid: Velocities in the right ICA are consistent with a high-end range 1-39% stenosis.  - Left Carotid: Velocities in the left ICA are consistent with a 1-39%  stenosis. Hemodynamically significant plaque >50% visualized in the  CCA.  - Vertebrals: Right vertebral artery demonstrates antegrade flow. Left  vertebral artery is antegrade with atypical waveforms.  - Subclavians: Left subclavian artery was stenotic. Bilateral subclavian  artery flow was disturbed.  _______________  Lower Extremity Arterial Ultrasounds/ ABIs 01/05/2023: Summary:  Right: Stenosis is noted within the proximal-mid stent. Mild progression is noted compared to previous study. Moderate atherosclerosis noted throughout extremity.   ABI Summary: Right: Resting right ankle-brachial index is within normal range. The  right toe-brachial index is normal.   Left: Resting left ankle-brachial index is within normal range. The left  toe-brachial index is normal.  _______________   EKG:  EKG ordered today.   EKG Interpretation Date/Time:  Thursday May 11 2023 08:52:58 EDT Ventricular Rate:  55 PR Interval:  128 QRS Duration:  102 QT Interval:  450 QTC Calculation: 430 R Axis:   60  Text Interpretation: Sinus bradycardia Incomplete right bundle branch block Possible Lateral infarct (cited on or before 16-Sep-2020) When compared with ECG of 16-Sep-2020 17:13, No significant change since Confirmed by Marjie Skiff [phone]) on 05/11/2023 8:54:52 AM    Recent Labs: No results found for requested labs within last 365 days.  Recent Lipid Panel    Component Value Date/Time   CHOL 89 (L) 03/04/2021 0816   TRIG 68 03/04/2021 0816   HDL 45 03/04/2021 0816   CHOLHDL 2.0 03/04/2021 0816   CHOLHDL 7.6 09/05/2012 1201   VLDL 19 09/05/2012 1201   LDLCALC 29 03/04/2021 0816    Physical Exam:    Vital Signs: BP 132/68   Pulse (!) 55   Ht 5\' 7"  (1.702 m)   Wt 169 lb 3.2 oz (76.7 kg)   SpO2 97%   BMI 26.50 kg/m     Wt Readings from Last 3 Encounters:  05/11/23 169 lb 3.2 oz (76.7 kg)  11/09/22 172 lb (78 kg)  12/23/21 159 lb 6.4 oz (72.3 kg)     General: 68 y.o. Caucasian male in no acute distress. HEENT: Normocephalic and atraumatic. Sclera clear.  Neck: Supple. No carotid bruits. No JVD. Heart: RRR. Distinct S1 and S2. No murmurs, gallops, or rubs.  Lungs: No increased work of breathing. Clear to ausculation bilaterally. No wheezes, rhonchi, or rales.  Abdomen: Soft, non-distended, and non-tender  to palpation.  Extremities: No lower extremity edema.  Skin: Warm and dry. Neuro: No focal deficits. Psych: Normal affect. Responds appropriately.  Assessment:    1. Pre-op evaluation   2. PAD (peripheral artery disease) (HCC)   3. Bilateral carotid artery stenosis   4. Renal artery stenosis (HCC)   5. Primary hypertension   6. Hyperlipidemia, unspecified hyperlipidemia type     Plan:    Pre-Op Evaluation  Patient has upcoming colonoscopy planned. He is doing well from a cardiac standpoint with no chest pain, shortness of  breath, acute CHF symptoms, palpitations, dizziness, or syncope. He is able to complete >4.0 METS of physical activity with any anginal symptoms. Per Revised Cardiac Risk Index, there is a 3.9% chance of a major cardiac event perioperatively. Therefore, based on ACC/AHA guidelines, patient would be at acceptable risk for the planned procedure without further cardiovascular testing. Per Dr. Allyson Sabal, OK to hold Plavix for 5 days prior to procedure. Please resume as soon as safely possible afterwards. I will route this recommendation to the requesting party via Epic fax function.  Of note, he also states he will likely need cervical spine surgery in the near future for a pinched nerve. He is scheduled to see a surgeon soon about this. He is at acceptable risk for this procedure as well. However, did request that surgeon send [place] a pre-op clearance form.   PAD Patient has had multiple interventions to right SFA most recently in 05/2019. Peripheral angiogram at that time also showed moderate left common iliac disease. Most recent lower extremity dopplers in 12/2022 which showed moderate atherosclerosis throughout right extremity with mild progression of stenosis within proximal to mid stent. ABIs and TBIs were normal bilaterally.  - No claudication. - Continue DAPT with Aspirin and Plavix. - Continue lipid therapy as below.  Carotid Artery Disease Most recent carotid dopplers in 09/2022 showed mild stenosis (1-39%) of bilateral ICAs as well as a stenotic left subclavian artery and disturbed flow of bilateral subclavian artery. - Continue DAPT and lipid therapy as below. - Plan is for repeat carotid dopplers in 09/2023.  Renal Artery Stenosis Peripheral angiogram in 2014 showed 80% stenosis of right renal artery. - Continue DAPT and lipid therapy as below. - Will order renal dopplers. This can be done in 09/2023 when he comes for carotid dopplers.   Hypertension BP well controlled.  - Continue Amlodipine  10mg  daily.   Hyperlipidemia Lipid panel in 02/2023 per [facility]: Total Cholesterol 177, Triglycerides 159, HDL 52, LDL 97. LDL goal <55.  - Continue Pravastatin 20mg  daily.  - He was previously on Repatha and LDL was well controlled on this. However, states he has not been able to afford Repatha this year. Will reach our to our Pharmacy team to see if there is anything we can do to help with this or if we can switch to Praluent.   Disposition: Follow up around 12/2023 with Dr. Allyson Sabal.   Leanne Lovely, PA-C  05/11/2023 12:44 PM    [facility]

## 2023-05-03 NOTE — Telephone Encounter (Signed)
 Hey Dr. Allyson Sabal,  George Cooper has an upcoming colonoscopy planned in April. He is on DAPT with Aspirin and Plavix for his PAD. He has had multiple interventions to his right SFA (most recently in 2021). Are you okay if they hold his Plavix for 5 days prior to colonoscopy?  Thank you! Cortnie Ringel

## 2023-05-11 ENCOUNTER — Ambulatory Visit: Attending: Student | Admitting: Student

## 2023-05-11 ENCOUNTER — Telehealth: Payer: Self-pay

## 2023-05-11 ENCOUNTER — Encounter: Payer: Self-pay | Admitting: Student

## 2023-05-11 VITALS — BP 132/68 | HR 55 | Ht 67.0 in | Wt 169.2 lb

## 2023-05-11 DIAGNOSIS — Z01818 Encounter for other preprocedural examination: Secondary | ICD-10-CM

## 2023-05-11 DIAGNOSIS — I701 Atherosclerosis of renal artery: Secondary | ICD-10-CM

## 2023-05-11 DIAGNOSIS — I739 Peripheral vascular disease, unspecified: Secondary | ICD-10-CM

## 2023-05-11 DIAGNOSIS — I6523 Occlusion and stenosis of bilateral carotid arteries: Secondary | ICD-10-CM

## 2023-05-11 DIAGNOSIS — E785 Hyperlipidemia, unspecified: Secondary | ICD-10-CM | POA: Diagnosis not present

## 2023-05-11 DIAGNOSIS — I10 Essential (primary) hypertension: Secondary | ICD-10-CM

## 2023-05-11 NOTE — Patient Instructions (Addendum)
 Medication Instructions:  The current medical regimen is effective;  continue present plan and medications as directed. Please refer to the Current Medication list given to you today.  *If you need a refill on your cardiac medications before your next appointment, please call your pharmacy*  Lab Work: NONE  Testing/Procedures: RENAL Carlye Grippe WITH CAROTID  Your physician has requested that you have a carotid duplex(DUE IN AUGUST 2025). This test is an ultrasound of the carotid arteries in your neck. It looks at blood flow through these arteries that supply the brain with blood. Allow one hour for this exam. There are no restrictions or special instructions.   Follow-Up: At [facility], you and your health needs are our priority.  As part of our continuing mission to provide you with exceptional heart care, we have created designated Provider Care Teams.  These Care Teams include your primary Cardiologist (physician) and Advanced Practice Providers (APPs -  Physician Assistants and Nurse Practitioners) who all work together to provide you with the care you need, when you need it.  Your next appointment:   NOVEMBER 2025-CALL IN JULY 2025 TO SCHEDULE  Provider:   Nanetta Batty, MD     Other Instructions CLEARED FOR COLONOSCOPY-CAN HOLD PLAVIX 5 DAYS BEFORE SOMEONE WILL BE CALLING YOU TO DISCUSS YOUR PRAULENT

## 2023-05-16 ENCOUNTER — Telehealth: Payer: Self-pay | Admitting: Pharmacy Technician

## 2023-05-16 ENCOUNTER — Other Ambulatory Visit (HOSPITAL_COMMUNITY): Payer: Self-pay

## 2023-05-16 NOTE — Telephone Encounter (Signed)
-   He was previously on Repatha and LDL was well controlled on this. However, states he has not been able to afford Repatha this year. Will reach our to our Pharmacy team to see if there is anything we can do to help with this or if we can switch to Praluent.

## 2023-05-16 NOTE — Telephone Encounter (Signed)
 Patient Advocate Encounter   The patient was approved for a [employer] grant that will help cover the cost of repatha Total amount awarded, 2500.  Effective: 04/16/23 - 04/14/24   [id number] HYQ:[id number] [id number] [employer] ID: [id number]   Pharmacy provided with approval and processing information. Patient informed via telephone

## 2023-05-16 NOTE — Telephone Encounter (Signed)
 Grant approved. Pharmacy and patient aware.

## 2023-05-16 NOTE — Telephone Encounter (Signed)
 Pharmacy Patient Advocate Encounter   Received notification from Pt Calls Messages that prior authorization for Repatha is required/requested.   Insurance verification completed.   The patient is insured through [employer] .   Per test claim: The current 05/16/23 day co-pay is, $144.24- one month.  No PA needed at this time. This test claim was processed through [facility]- copay amounts may vary at other pharmacies due to pharmacy/plan contracts, or as the patient moves through the different stages of their insurance plan.

## 2023-05-22 DIAGNOSIS — Z6826 Body mass index (BMI) 26.0-26.9, adult: Secondary | ICD-10-CM | POA: Diagnosis not present

## 2023-05-22 DIAGNOSIS — M4712 Other spondylosis with myelopathy, cervical region: Secondary | ICD-10-CM | POA: Diagnosis not present

## 2023-05-22 DIAGNOSIS — M5412 Radiculopathy, cervical region: Secondary | ICD-10-CM | POA: Diagnosis not present

## 2023-05-24 ENCOUNTER — Other Ambulatory Visit: Payer: Self-pay | Admitting: Diagnostic Neuroimaging

## 2023-05-24 DIAGNOSIS — D124 Benign neoplasm of descending colon: Secondary | ICD-10-CM | POA: Diagnosis not present

## 2023-05-24 DIAGNOSIS — D123 Benign neoplasm of transverse colon: Secondary | ICD-10-CM | POA: Diagnosis not present

## 2023-05-24 DIAGNOSIS — D125 Benign neoplasm of sigmoid colon: Secondary | ICD-10-CM | POA: Diagnosis not present

## 2023-05-24 DIAGNOSIS — Z1211 Encounter for screening for malignant neoplasm of colon: Secondary | ICD-10-CM | POA: Diagnosis not present

## 2023-05-24 DIAGNOSIS — K5730 Diverticulosis of large intestine without perforation or abscess without bleeding: Secondary | ICD-10-CM | POA: Diagnosis not present

## 2023-05-26 DIAGNOSIS — D125 Benign neoplasm of sigmoid colon: Secondary | ICD-10-CM | POA: Diagnosis not present

## 2023-05-26 DIAGNOSIS — D124 Benign neoplasm of descending colon: Secondary | ICD-10-CM | POA: Diagnosis not present

## 2023-05-26 DIAGNOSIS — D123 Benign neoplasm of transverse colon: Secondary | ICD-10-CM | POA: Diagnosis not present

## 2023-06-08 ENCOUNTER — Other Ambulatory Visit: Payer: Self-pay | Admitting: Cardiology

## 2023-06-12 ENCOUNTER — Ambulatory Visit
Admission: EM | Admit: 2023-06-12 | Discharge: 2023-06-12 | Disposition: A | Discharge status: Home / Self Care | Attending: Nurse Practitioner | Admitting: Nurse Practitioner

## 2023-06-12 DIAGNOSIS — H5711 Ocular pain, right eye: Secondary | ICD-10-CM

## 2023-06-12 DIAGNOSIS — H1131 Conjunctival hemorrhage, right eye: Secondary | ICD-10-CM

## 2023-06-12 NOTE — ED Notes (Signed)
 Patient is being discharged from the Urgent Care and sent to the Emergency Department via POV . Per Thena Fireman, NP, patient is in need of higher level of care due to Eye injury. Patient is aware and verbalizes understanding of plan of care.  Vitals:   06/12/23 1411  BP: 139/70  Pulse: 68  Resp: 16  Temp: 99.3 F (37.4 C)  SpO2: 93%

## 2023-06-12 NOTE — ED Triage Notes (Signed)
 Pt reports being hit In the right eye with a rick while cutting his grass, this occurred on Friday. Pain started this morning feels like a constant headache.

## 2023-06-12 NOTE — Discharge Instructions (Addendum)
 Please go directly to the emergency room for further evaluation and management.

## 2023-06-12 NOTE — ED Provider Notes (Signed)
 [facility]    CSN: [id number] Arrival date & time: 06/12/23  1347      History   Chief Complaint No chief complaint on file.   HPI George Cooper is a 68 y.o. male.   Patient presents today with right eye pain.  Reports 3 days ago while cutting his grass, a rock hit him in the eye.  He endorses swelling of the right eye, redness of the right eye, and blurred vision of the right eye ever since.  Reports this morning he woke up, and the eye pain was worse so he came in to be seen.  He denies foreign body sensation.  Has history of cataract surgery bilaterally.    Past Medical History:  Diagnosis Date   Arthritis    "right knee" (09/18/2012)   Carotid artery disease (HCC)    Claudication (HCC)    a. 11/09/15 Hawk atherectomy of SFA   Dizziness    History of gout    Hyperlipidemia    Lyme disease 2006   "after bit by a tick" (09/18/2012)   Renal artery stenosis (HCC)    Tobacco abuse    discontinued October 2014    Patient Active Problem List   Diagnosis Date Noted   Claudication in peripheral vascular disease (HCC) 06/17/2019   Essential hypertension 02/26/2019   Cerebral infarction (HCC) 11/16/2015   Left-sided weakness 11/16/2015   Headache 11/16/2015   Hyperlipidemia 10/10/2012   Pain of right thigh 09/21/2012   PAD (peripheral artery disease), s/p athrectomy, PTA/ stent Rt. SFA  09/18/2012 09/19/2012   Carotid artery disease (HCC) 08/01/2012   Dizziness 08/01/2012   Claudication [facility])     Past Surgical History:  Procedure Laterality Date   ABDOMINAL ANGIOGRAM  09/18/2012   Procedure: ABDOMINAL ANGIOGRAM;  Surgeon: Avanell Leigh, MD;  Location: [facility] CATH LAB;  Service: Cardiovascular;;   ABDOMINAL AORTOGRAM W/LOWER EXTREMITY N/A 06/17/2019   Procedure: ABDOMINAL AORTOGRAM W/LOWER EXTREMITY;  Surgeon: Avanell Leigh, MD;  Location: [facility] INVASIVE CV LAB;  Service: Cardiovascular;  Laterality: N/A;   ATHERECTOMY Right 09/18/2012   Procedure:  ATHERECTOMY;  Surgeon: Avanell Leigh, MD;  Location: [facility] CATH LAB;  Service: Cardiovascular;  Laterality: Right;  right SFA   KNEE ARTHROSCOPY Right 1990's   "twice" (09/18/2012)   KNEE ARTHROSCOPY Left 1980's   LOWER EXTREMITY ANGIOGRAM N/A 09/18/2012   Procedure: LOWER EXTREMITY ANGIOGRAM;  Surgeon: Avanell Leigh, MD;  Location: [facility] CATH LAB;  Service: Cardiovascular;  Laterality: N/A;   PERCUTANEOUS STENT INTERVENTION  09/18/2012   Procedure: PERCUTANEOUS STENT INTERVENTION;  Surgeon: Avanell Leigh, MD;  Location: [facility] CATH LAB;  Service: Cardiovascular;;  right SFA   PERIPHERAL ARTERIAL STENT GRAFT Right 09/18/2012   "PTA & stenting right SFA"/notes 09/18/2012   PERIPHERAL VASCULAR CATHETERIZATION N/A 11/09/2015   Procedure: Abdominal Aortogram w/Lower Extremity;  Surgeon: Avanell Leigh, MD;  Location: [facility] INVASIVE CV LAB;  Service: Cardiovascular;  Laterality: N/A;   PERIPHERAL VASCULAR CATHETERIZATION Right 11/09/2015   Procedure: Peripheral Vascular Balloon Angioplasty;  Surgeon: Avanell Leigh, MD;  Location: [facility] INVASIVE CV LAB;  Service: Cardiovascular;  Laterality: Right;  SFA   PERIPHERAL VASCULAR CATHETERIZATION Right 11/09/2015   Procedure: Peripheral Vascular Atherectomy;  Surgeon: Avanell Leigh, MD;  Location: [facility] INVASIVE CV LAB;  Service: Cardiovascular;  Laterality: Right;  SFA   PERIPHERAL VASCULAR INTERVENTION Right 06/17/2019   Procedure: PERIPHERAL VASCULAR INTERVENTION;  Surgeon: Avanell Leigh, MD;  Location: [facility] INVASIVE CV LAB;  Service: Cardiovascular;  Laterality: Right;  superficial femoral       Home Medications    Prior to Admission medications   Medication Sig Start Date End Date Taking? Authorizing Provider  amLODipine  (NORVASC ) 10 MG tablet TAKE (1) TABLET BY MOUTH ONCE DAILY. 03/06/23   Avanell Leigh, MD  aspirin  EC 81 MG tablet Take 1 tablet (81 mg total) by mouth daily. 01/09/18   Avanell Leigh, MD  clopidogrel  (PLAVIX ) 75 MG tablet TAKE 1  TABLET WITH BREAKFAST. 02/14/23   Avanell Leigh, MD  gabapentin  (NEURONTIN ) 300 MG capsule Take 1 capsule (300 mg total) by mouth at bedtime. 11/09/22   Penumalli, Vikram R, MD  methylPREDNISolone (MEDROL DOSEPAK) 4 MG TBPK tablet Take 4 mg by mouth See admin instructions. 09/16/22   [provider]  naproxen sodium (ANAPROX) 220 MG tablet Take 220 mg by mouth daily as needed.    [provider]  ondansetron  (ZOFRAN  ODT) 4 MG disintegrating tablet Take 1 tablet (4 mg total) by mouth every 8 (eight) hours as needed for nausea or vomiting. Patient not taking: Reported on 05/11/2023 09/16/20   Aberman, Caroline C, PA-C  pravastatin  (PRAVACHOL ) 20 MG tablet Take 20 mg by mouth daily. 11/29/21   [provider]  predniSONE  (DELTASONE ) 20 MG tablet Take 2 tablets (40 mg total) by mouth daily with breakfast. Patient not taking: Reported on 05/11/2023 11/07/22   Corbin Dess, PA-C  REPATHA  SURECLICK 140 MG/ML SOAJ INJECT 140 MG INTO THE SKIN EVERY 14 DAYS 06/08/23   Laurann Pollock, MD  sildenafil (VIAGRA) 100 MG tablet Take 100 mg by mouth daily. Patient not taking: Reported on 05/11/2023 12/02/21   [provider]  tadalafil  (CIALIS ) 5 MG tablet Take 5 mg by mouth daily as needed. Patient not taking: Reported on 05/11/2023 11/04/22   [provider]  tizanidine  (ZANAFLEX ) 2 MG capsule Take 1 capsule (2 mg total) by mouth 3 (three) times daily as needed for muscle spasms. Do not drink alcohol or drive while taking this medication.  May cause drowsiness. Patient not taking: Reported on 05/11/2023 11/07/22   Corbin Dess, PA-C  tiZANidine  (ZANAFLEX ) 2 MG tablet Take 2 mg by mouth 3 (three) times daily as needed. Patient not taking: Reported on 05/11/2023 11/07/22   [provider]    Family History Family History  Problem Relation Age of Onset   Heart Problems Mother        CABG, PCI   Stroke Father    Heart failure Maternal  Grandmother    Heart attack Maternal Grandfather    Heart failure Paternal Grandmother    Heart attack Paternal Grandfather    Stroke Paternal Grandfather    Diabetes Paternal Grandfather    Heart Problems Sister        PCI    Social History Social History   Tobacco Use   Smoking status: Former    Current packs/day: 0.00    Average packs/day: 0.2 packs/day for 40.0 years (8.0 ttl pk-yrs)    Types: Cigarettes    Start date: 11/29/1972    Quit date: 11/29/2012    Years since quitting: 10.5   Smokeless tobacco: Never  Substance Use Topics   Alcohol use: Not Currently   Drug use: Yes    Types: Marijuana    Comment: "every once in a while"     Allergies   Patient has no known allergies.   Review of Systems Review of Systems Per HPI  Physical Exam Triage Vital Signs ED Triage Vitals  Encounter Vitals Group     BP 06/12/23 1411 139/70     Systolic BP Percentile --      Diastolic BP Percentile --      Pulse Rate 06/12/23 1411 68     Resp 06/12/23 1411 16     Temp 06/12/23 1411 99.3 F (37.4 C)     Temp Source 06/12/23 1411 Oral     SpO2 06/12/23 1411 93 %     Weight --      Height --      Head Circumference --      Peak Flow --      Pain Score 06/12/23 1413 8     Pain Loc --      Pain Education --      Exclude from Growth Chart --    No data found.  Updated Vital Signs BP 139/70 (BP Location: Right Arm)   Pulse 68   Temp 99.3 F (37.4 C) (Oral)   Resp 16   SpO2 93%   Visual Acuity Right Eye Distance: 20/70 Left Eye Distance: 20/20 Bilateral Distance: 20/20  Right Eye Near:   Left Eye Near:    Bilateral Near:     Physical Exam Vitals and nursing note reviewed.  Constitutional:      General: He is not in acute distress.    Appearance: Normal appearance. He is not toxic-appearing.  HENT:     Head: Normocephalic and atraumatic.     Right Ear: External ear normal.     Left Ear: External ear normal.     Mouth/Throat:     Mouth: Mucous membranes  are moist.     Pharynx: Oropharynx is clear.  Eyes:     General:        Right eye: No discharge.        Left eye: No discharge.     Extraocular Movements: Extraocular movements intact.     Right eye: Normal extraocular motion.     Left eye: Normal extraocular motion.     Conjunctiva/sclera:     Right eye: Hemorrhage present.     Comments: Subconjunctival hemorrhage right eye lower aspect; patient has tenderness to palpation of the lower orbital bone  Cardiovascular:     Rate and Rhythm: Normal rate.  Pulmonary:     Effort: Pulmonary effort is normal. No respiratory distress.  Musculoskeletal:     Cervical back: Normal range of motion.  Lymphadenopathy:     Cervical: No cervical adenopathy.  Skin:    General: Skin is warm and dry.     Coloration: Skin is not jaundiced or pale.     Findings: No bruising or erythema.  Neurological:     Mental Status: He is alert and oriented to person, place, and time.      UC Treatments / Results  Labs (all labs ordered are listed, but only abnormal results are displayed) Labs Reviewed - No data to display  EKG   Radiology No results found.  Procedures Procedures (including critical care time)  Medications Ordered in UC Medications - No data to display  Initial Impression / Assessment and Plan / UC Course  I have reviewed the triage vital signs and the nursing notes.  Pertinent labs & imaging results that were available during my care of the patient were reviewed by me and considered in my medical decision making (see chart for details).   Patient is well-appearing, normotensive, afebrile, not  tachycardic, not tachypneic, oxygenating well on room air.    1. Pain of right orbit 2. Subconjunctival hemorrhage of right eye Given significant pain, decreased visual acuity, subconjunctival hemorrhage of the right eye after trauma, I recommended further evaluation and management in the emergency room Patient is in agreement to  plan Patient is stable to transport via private vehicle at this time  The patient was given the opportunity to ask questions.  All questions answered to their satisfaction.  The patient is in agreement to this plan.    Final Clinical Impressions(s) / UC Diagnoses   Final diagnoses:  Pain of right orbit  Subconjunctival hemorrhage of right eye   Discharge Instructions   None    ED Prescriptions   None    PDMP not reviewed this encounter.   Wilhemena Harbour, NP 06/12/23 [phone]

## 2023-07-05 ENCOUNTER — Other Ambulatory Visit: Payer: Self-pay | Admitting: Cardiology

## 2023-07-07 DIAGNOSIS — E663 Overweight: Secondary | ICD-10-CM | POA: Diagnosis not present

## 2023-07-07 DIAGNOSIS — Z6825 Body mass index (BMI) 25.0-25.9, adult: Secondary | ICD-10-CM | POA: Diagnosis not present

## 2023-07-07 DIAGNOSIS — M62830 Muscle spasm of back: Secondary | ICD-10-CM | POA: Diagnosis not present

## 2023-07-27 DIAGNOSIS — Z961 Presence of intraocular lens: Secondary | ICD-10-CM | POA: Diagnosis not present

## 2023-07-31 DIAGNOSIS — N529 Male erectile dysfunction, unspecified: Secondary | ICD-10-CM | POA: Diagnosis not present

## 2023-07-31 DIAGNOSIS — E663 Overweight: Secondary | ICD-10-CM | POA: Diagnosis not present

## 2023-07-31 DIAGNOSIS — Z6825 Body mass index (BMI) 25.0-25.9, adult: Secondary | ICD-10-CM | POA: Diagnosis not present

## 2023-08-02 ENCOUNTER — Other Ambulatory Visit: Payer: Self-pay | Admitting: Cardiovascular Disease

## 2023-08-02 DIAGNOSIS — E785 Hyperlipidemia, unspecified: Secondary | ICD-10-CM

## 2023-08-02 DIAGNOSIS — I739 Peripheral vascular disease, unspecified: Secondary | ICD-10-CM

## 2023-08-02 DIAGNOSIS — I6523 Occlusion and stenosis of bilateral carotid arteries: Secondary | ICD-10-CM

## 2023-09-18 ENCOUNTER — Telehealth: Payer: Self-pay | Admitting: *Deleted

## 2023-09-18 NOTE — Telephone Encounter (Signed)
   Pre-operative Risk Assessment    Patient Name: George Cooper  DOB: 1955-07-24 MRN: [id number]   Date of last office visit: 05/11/23 ALINE DOOR, [facility] Date of next office visit: NONE   Request for Surgical Clearance    Procedure:  CERVICAL FUSION  Date of Surgery:  Clearance 01/09/24                                Surgeon:  DR. DORN NED Surgeon's Group or Practice Name:  [facility] Phone number:  [phone] Fax number:  [phone] ATTN: LEVON   Type of Clearance Requested:   - Medical  - Pharmacy:  Hold Aspirin  and Clopidogrel  (Plavix )     Type of Anesthesia:  General    Additional requests/questions:    [name]   09/18/2023, 2:10 PM

## 2023-09-20 NOTE — Telephone Encounter (Signed)
   Name: George Cooper  DOB: August 24, 1955  MRN: [id number]  Primary Cardiologist: Dorn Lesches, MD   Preoperative team, please contact this patient and set up a phone call appointment for further preoperative risk assessment. Please obtain consent and complete medication review. Thank you for your help. Last seen by Callie Goodrich on 05/11/2023.  Date of procedure 01/09/2024.  I confirm that guidance regarding antiplatelet and oral anticoagulation therapy has been completed and, if necessary, noted below.  Per office protocol, if patient is without any new symptoms or concerns at the time of their virtual visit, he may hold Plavix  for 5 days prior to procedure. Please resume Plavix  as soon as possible postprocedure, at the discretion of the surgeon.    I also confirmed the patient resides in the state of [place] . As per [facility] [employer] telemedicine laws, the patient must reside in the state in which the provider is licensed.   [name], NP 09/20/2023, 7:49 AM [facility]

## 2023-09-20 NOTE — Telephone Encounter (Signed)
 I s/w the pt about scheduling tele preop appt. Procedure is 01/09/24. I d/w the pt that I also reviewed the ov notes from Aline Door, NP who states to f/u with Dr. Court around 12/23/23. In my discussion with the pt I did offer to him that if he would like to do tele proep appt we can, however this does understand this would be a charge for the tele preop appt and then he would see Dr. Court sometime early 12/2023 which would be another charge. Pt opts to just schedule in office appt with Dr. Court 12/13/23 for his f/u with MD per Callie Goodrich, PAC and for preop clearance as well.    Pt thanked me for the help.   I will update all parties involved of pt's appt.

## 2023-09-22 ENCOUNTER — Encounter (HOSPITAL_COMMUNITY)

## 2023-09-22 ENCOUNTER — Ambulatory Visit (HOSPITAL_BASED_OUTPATIENT_CLINIC_OR_DEPARTMENT_OTHER)
Admission: RE | Admit: 2023-09-22 | Discharge: 2023-09-22 | Disposition: A | Discharge status: Home / Self Care | Source: Ambulatory Visit | Attending: Cardiovascular Disease | Admitting: Cardiovascular Disease

## 2023-09-22 ENCOUNTER — Ambulatory Visit: Payer: Self-pay | Admitting: Cardiovascular Disease

## 2023-09-22 ENCOUNTER — Ambulatory Visit (HOSPITAL_COMMUNITY)
Admission: RE | Admit: 2023-09-22 | Discharge: 2023-09-22 | Disposition: A | Discharge status: Home / Self Care | Source: Ambulatory Visit | Attending: Student | Admitting: Student

## 2023-09-22 DIAGNOSIS — I6523 Occlusion and stenosis of bilateral carotid arteries: Secondary | ICD-10-CM | POA: Insufficient documentation

## 2023-09-22 DIAGNOSIS — I701 Atherosclerosis of renal artery: Secondary | ICD-10-CM | POA: Insufficient documentation

## 2023-09-25 ENCOUNTER — Ambulatory Visit: Payer: Self-pay | Admitting: Student

## 2023-10-30 ENCOUNTER — Telehealth: Payer: Self-pay | Admitting: Cardiovascular Disease

## 2023-10-30 NOTE — Telephone Encounter (Signed)
 Patient states he has had a dull ache on his left side (around his kidney area) since the time he had his renal ultrasound (8/1).   He states pain has become progressively worse, it is intermittent, and rates it 7/10 at worst and 0/10 when it goes away.  Patient states he was told by our office to notify us  if he has any kidney pain after his ultrasound.  Informed patient I will forward message to Dr. Court to review, but also advised he contact his PCP to discuss. Patient verbalized understanding.

## 2023-10-30 NOTE — Telephone Encounter (Signed)
 Patient says after 8/01 renal ultrasound he was advised to contact the office if he had any pain in his kidneys. He says he has had a dull pain that comes and goes for about 1 week now. Please advise.

## 2023-11-16 DIAGNOSIS — L821 Other seborrheic keratosis: Secondary | ICD-10-CM | POA: Diagnosis not present

## 2023-11-27 ENCOUNTER — Telehealth: Payer: Self-pay | Admitting: Cardiovascular Disease

## 2023-11-27 NOTE — Telephone Encounter (Signed)
Spoke with pt, aware of dr berry's recommendations. 

## 2023-11-27 NOTE — Telephone Encounter (Signed)
 Pt c/o medication issue:  1. Name of Medication:   clopidogrel  (PLAVIX ) 75 MG tablet    2. How are you currently taking this medication (dosage and times per day)? As written  3. Are you having a reaction (difficulty breathing--STAT)? no  4. What is your medication issue? Pt states the medication above along with aspirin  maybe causing nose bleeds. Nose Bleeding started on Sat through today. Pt would like a call back regarding this matter

## 2023-11-27 NOTE — Telephone Encounter (Signed)
 Spoke to patient and he reports that since Saturday he has been having pink tinged discharge from his nose. Pt reports that it feels like his nose in running and then when wipes his nose the discharge is pink. Please reports that he is still taking plavix  and aspirin .

## 2023-12-13 ENCOUNTER — Ambulatory Visit: Attending: Cardiovascular Disease | Admitting: Cardiovascular Disease

## 2023-12-13 ENCOUNTER — Encounter: Payer: Self-pay | Admitting: Cardiovascular Disease

## 2023-12-13 VITALS — BP 120/62 | HR 57 | Ht 67.0 in | Wt 164.3 lb

## 2023-12-13 DIAGNOSIS — I739 Peripheral vascular disease, unspecified: Secondary | ICD-10-CM | POA: Diagnosis not present

## 2023-12-13 DIAGNOSIS — E782 Mixed hyperlipidemia: Secondary | ICD-10-CM

## 2023-12-13 DIAGNOSIS — I10 Essential (primary) hypertension: Secondary | ICD-10-CM | POA: Diagnosis not present

## 2023-12-13 DIAGNOSIS — Z01818 Encounter for other preprocedural examination: Secondary | ICD-10-CM | POA: Diagnosis not present

## 2023-12-13 NOTE — Assessment & Plan Note (Signed)
 History of essential hypertension blood pressure measured today at 120/62.  He is on amlodipine .

## 2023-12-13 NOTE — Assessment & Plan Note (Signed)
 Patient scheduled for cervical fusion 11/18.  He is very active does 100 push-ups a day as work well is working out on the [employer] and is completely asymptomatic.  His last echo in 2018 showed normal LV function.  He has not had a stress test.  I am going to repeat a 2D echocardiogram.

## 2023-12-13 NOTE — Assessment & Plan Note (Signed)
 History of PAD status post multiple right lower extremity interventions beginning initially 09/18/2012, 11/09/2015 and most recently 06/17/2019.  He currently denies claudication.  His most recent Doppler studies performed 01/05/2023 revealed a right ABI 1.01, left of 1.02 with moderate in-stent restenosis within the right SFA stent.

## 2023-12-13 NOTE — Patient Instructions (Signed)
 Medication Instructions:  Your physician recommends that you continue on your current medications as directed. Please refer to the Current Medication list given to you today.  *If you need a refill on your cardiac medications before your next appointment, please call your pharmacy*  Testing/Procedures: Your physician has requested that you have an echocardiogram. Echocardiography is a painless test that uses sound waves to create images of your heart. It provides your doctor with information about the size and shape of your heart and how well your heart's chambers and valves are working. This procedure takes approximately one hour. There are no restrictions for this procedure. Please do NOT wear cologne, perfume, aftershave, or lotions (deodorant is allowed). Please arrive 15 minutes prior to your appointment time.  Please note: We ask at that you not bring children with you during ultrasound (echo/ vascular) testing. Due to room size and safety concerns, children are not allowed in the ultrasound rooms during exams. Our front office staff cannot provide observation of children in our lobby area while testing is being conducted. An adult accompanying a patient to their appointment will only be allowed in the ultrasound room at the discretion of the ultrasound technician under special circumstances. We apologize for any inconvenience.   Follow-Up: At [facility], you and your health needs are our priority.  As part of our continuing mission to provide you with exceptional heart care, our providers are all part of one team.  This team includes your primary Cardiologist (physician) and Advanced Practice Providers or APPs (Physician Assistants and Nurse Practitioners) who all work together to provide you with the care you need, when you need it.  Your next appointment:   12 month(s)  Provider:   Lauro Portal, MD    We recommend signing up for the patient portal called MyChart.  Sign  up information is provided on this After Visit Summary.  MyChart is used to connect with patients for Virtual Visits (Telemedicine).  Patients are able to view lab/test results, encounter notes, upcoming appointments, etc.  Non-urgent messages can be sent to your provider as well.   To learn more about what you can do with MyChart, go to [email or web address].

## 2023-12-13 NOTE — Assessment & Plan Note (Signed)
 History of hyperlipidemia on Repatha  and statin therapy (pravastatin ) with lipid profile performed 04/08/2023 revealing total cholesterol 177, LDL of 97 and HDL of 52, not at goal for secondary prevention.  When I see him next we will discuss either changing his statin or adding Zetia.

## 2023-12-13 NOTE — Progress Notes (Signed)
 12/13/2023 George Cooper   28-Mar-1955  [id number]  Primary Physician Marvine Rush, MD Primary Cardiologist: Dorn JINNY Lesches MD GENI CODY MADEIRA, FSCAI  HPI:  George Cooper is a 68 y.o.    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 Zell Sink , at [facility], in [place] for evaluation of dizziness and carotid artery disease. I last saw him in the office 12/23/2021.[name] He did complain however of right calf claudication. His cardiovascular risk profile is positive for a 20-pack-year history of tobacco abuse discontinued 10/14. He is not diabetic, hypertensive or hyperlipidemic. His mother did have bypass surgery in her 42s and his sister has had coronary stents as well. A stress test which was normal. Lower extremity arterial Dopplers revealed a high-frequency signal in the proximal segment of his right SFA. On 09/18/12 the patient underwent peripheral angiography, directional atherectomy, PTA and stenting of a high-grade segmental proximal right SFA stenosis. He had excellent angiographic and clinical result. His Doppler show a widely patent proximal right SFA and his claudication has resolved. His lower extremity arterial Doppler studies performed 05/14/13 revealed a widely patent stent with ABIs 1 bilaterally. He complains of mild right calf claudication with significant exertion. Since I saw him last he remained stable. He denies chest pain, shortness of breath . Lower extremity Dopplers performed 08/27/15 revealed a decline in his right ABI from one down to 0.91 with a new high-frequency signal in his distal right SFA. He underwent peripheral angiography by myself 11/09/15 feeling a patent mid right SFA stent with a 95% stenosis just beyond this. I performed one directional atherectomy followed by drug-eluting balloon angioplasty with excellent angiographic  and clinical result. His claudication has completely resolved as Dopplers performed on 11/11/15 have normalized. He was admitted to the hospital on 11/16/15 with a transient neurologic event with negative workup. Since I saw him in the office in October of last year he was remained stable. He denies chest pain, shortness of breath or claudication. He is on Repatha   with an excellent clinical result. His most recent lipid profile performed 02/20/2017 revealed total cholesterol of 82, LDL 23 and HDL 43.  He had follow-up lower extremity arterial Doppler studies performed 08/31/16 revealing a new high frequency signal in his mid right SFA although he denies significant claudication.   Doppler studies performed 04/19/2019 revealing a right ankle-brachial index of 0.88 with a new high-frequency signal in his right SFA stent suggesting in-stent restenosis.  He has developed lifestyle limiting claudication on the right side .   As result of this I performed peripheral angiography on him 06/17/2019 revealing 95% in-stent restenosis within the previously placed right SFA stent in the distal aspect.  There is three-vessel runoff.  I restented this with an Eluvia 6 mm x 80 mm long self-expanding drug-coated stent.  He was discharged home the same day.  His claudication has resolved and his Dopplers are normalized.   Since I saw him in the office approximately 2 years ago he continues to do well.  He denies chest pain, shortness of breath or claudication.  He does continue to be active into the 100 push-ups a day and works out on the [employer].  His most recent Doppler studies performed 01/05/2023 revealed normal ABIs bilaterally with moderate in-stent restenosis within the right SFA stent.  He apparently is scheduled  to have cervical fusion 11/18 and needs cardiac clearance as well.   Current Meds  Medication Sig   amLODipine  (NORVASC ) 10 MG tablet TAKE (1) TABLET BY MOUTH ONCE DAILY.   aspirin  EC 81 MG tablet  Take 1 tablet (81 mg total) by mouth daily.   clopidogrel  (PLAVIX ) 75 MG tablet TAKE 1 TABLET WITH BREAKFAST.   Evolocumab  (REPATHA  SURECLICK) 140 MG/ML SOAJ INJECT CONTENTS OF 1 PEN SUBCUTANEOUSLY EVERY 14 DAYS   naproxen sodium (ANAPROX) 220 MG tablet Take 220 mg by mouth daily as needed.   pravastatin  (PRAVACHOL ) 20 MG tablet Take 20 mg by mouth daily.     No Known Allergies  Social History   Socioeconomic History   Marital status: Married    Spouse name: Not on file   Number of children: Not on file   Years of education: Not on file   Highest education level: Not on file  Occupational History   Not on file  Tobacco Use   Smoking status: Former    Current packs/day: 0.00    Average packs/day: 0.2 packs/day for 40.0 years (8.0 ttl pk-yrs)    Types: Cigarettes    Start date: 11/29/1972    Quit date: 11/29/2012    Years since quitting: 11.0   Smokeless tobacco: Never  Substance and Sexual Activity   Alcohol use: Not Currently   Drug use: Yes    Types: Marijuana    Comment: every once in a while   Sexual activity: Yes    Partners: Female    Comment: married  Other Topics Concern   Not on file  Social History Narrative   Not on file   Social Drivers of Health   Financial Resource Strain: Not on file  Food Insecurity: Not on file  Transportation Needs: Not on file  Physical Activity: Not on file  Stress: Not on file  Social Connections: Not on file  Intimate Partner Violence: Not on file     Review of Systems: General: negative for chills, fever, night sweats or weight changes.  Cardiovascular: negative for chest pain, dyspnea on exertion, edema, orthopnea, palpitations, paroxysmal nocturnal dyspnea or shortness of breath Dermatological: negative for rash Respiratory: negative for cough or wheezing Urologic: negative for hematuria Abdominal: negative for nausea, vomiting, diarrhea, bright red blood per rectum, melena, or hematemesis Neurologic: negative for  visual changes, syncope, or dizziness All other systems reviewed and are otherwise negative except as noted above.    Blood pressure 120/62, pulse (!) 57, height 5' 7 (1.702 m), weight 164 lb 4.8 oz (74.5 kg), SpO2 95%.  General appearance: alert and no distress Neck: no adenopathy, no JVD, supple, symmetrical, trachea midline, thyroid  not enlarged, symmetric, no tenderness/mass/nodules, and soft bilateral carotid bruits Lungs: clear to auscultation bilaterally Heart: regular rate and rhythm, S1, S2 normal, no murmur, click, rub or gallop Extremities: extremities normal, atraumatic, no cyanosis or edema Pulses: 2+ and symmetric Skin: Skin color, texture, turgor normal. No rashes or lesions Neurologic: Grossly normal  EKG EKG Interpretation Date/Time:  Wednesday December 13 2023 13:30:16 EDT Ventricular Rate:  57 PR Interval:  128 QRS Duration:  102 QT Interval:  432 QTC Calculation: 420 R Axis:   61  Text Interpretation: Sinus bradycardia Incomplete right bundle branch block Possible Lateral infarct (cited on or before 16-Sep-2020) When compared with ECG of 11-May-2023 08:52, No significant change was found Confirmed by Court Carrier [phone]) on 12/13/2023 1:34:46 PM    ASSESSMENT AND PLAN:   Claudication History of  PAD status post multiple right lower extremity interventions beginning initially 09/18/2012, 11/09/2015 and most recently 06/17/2019.  He currently denies claudication.  His most recent Doppler studies performed 01/05/2023 revealed a right ABI 1.01, left of 1.02 with moderate in-stent restenosis within the right SFA stent.  Hyperlipidemia History of hyperlipidemia on Repatha  and statin therapy (pravastatin ) with lipid profile performed 04/08/2023 revealing total cholesterol 177, LDL of 97 and HDL of 52, not at goal for secondary prevention.  When I see him next we will discuss either changing his statin or adding Zetia.  Essential hypertension History of essential  hypertension blood pressure measured today at 120/62.  He is on amlodipine .  Preoperative clearance Patient scheduled for cervical fusion 11/18.  He is very active does 100 push-ups a day as work well is working out on the [employer] and is completely asymptomatic.  His last echo in 2018 showed normal LV function.  He has not had a stress test.  I am going to repeat a 2D echocardiogram.     Dorn DOROTHA Lesches MD [facility], [facility] 12/13/2023 1:47 PM

## 2023-12-26 ENCOUNTER — Ambulatory Visit: Payer: Self-pay | Admitting: Cardiovascular Disease

## 2023-12-26 ENCOUNTER — Ambulatory Visit: Attending: Cardiovascular Disease

## 2023-12-26 DIAGNOSIS — Z01818 Encounter for other preprocedural examination: Secondary | ICD-10-CM | POA: Diagnosis not present

## 2023-12-26 DIAGNOSIS — E782 Mixed hyperlipidemia: Secondary | ICD-10-CM

## 2023-12-26 DIAGNOSIS — I10 Essential (primary) hypertension: Secondary | ICD-10-CM | POA: Diagnosis not present

## 2023-12-26 LAB — ECHOCARDIOGRAM COMPLETE
AR max vel: 2.13 cm2
AV Peak grad: 11.3 mmHg
Ao pk vel: 1.68 m/s
Area-P 1/2: 2.72 cm2
Calc EF: 72.9 %
S' Lateral: 2.5 cm
Single Plane A2C EF: 68.1 %
Single Plane A4C EF: 77 %

## 2024-01-03 NOTE — Progress Notes (Signed)
 Surgical Instructions   Your procedure is scheduled on January 09, 2024. Report to [facility] Main Entrance A at 5:30 A.M., then check in with the Admitting office. Any questions or running late day of surgery: call [phone]  Questions prior to your surgery date: call [phone], Monday-Friday, 8am-4pm. If you experience any cold or flu symptoms such as cough, fever, chills, shortness of breath, etc. between now and your scheduled surgery, please notify us  at the above number.     Remember:  Do not eat after midnight the night before your surgery   You may drink clear liquids until 4:30 the morning of your surgery.   Clear liquids allowed are: Water, Non-Citrus Juices (without pulp), Carbonated Beverages, Clear Tea (no milk, honey, etc.), Black Coffee Only (NO MILK, CREAM OR POWDERED CREAMER of any kind), and Gatorade.    Take these medicines the morning of surgery with A SIP OF WATER  amLODipine  (NORVASC )    May take these medicines IF NEEDED:    Aspirin  and Plavix  Please follow instructions given to you by your surgeon. If no instructions please reach out to the office for further instructions.   One week prior to surgery, STOP taking any (unless otherwise instructed by your surgeon) Aleve, Naproxen, Ibuprofen , Motrin , Advil , Goody's, BC's, all herbal medications, fish oil, and non-prescription vitamins.                     Do NOT Smoke (Tobacco/Vaping) for 24 hours prior to your procedure.  If you use a CPAP at night, you may bring your mask/headgear for your overnight stay.   You will be asked to remove any contacts, glasses, piercing's, hearing aid's, dentures/partials prior to surgery. Please bring cases for these items if needed.    Patients discharged the day of surgery will not be allowed to drive home, and someone needs to stay with them for 24 hours.  SURGICAL WAITING ROOM VISITATION Patients may have no more than 2 support people in the waiting area -  these visitors may rotate.   Pre-op nurse will coordinate an appropriate time for 1 ADULT support person, who may not rotate, to accompany patient in pre-op.  Children under the age of 33 must have an adult with them who is not the patient and must remain in the main waiting area with an adult.  If the patient needs to stay at the hospital during part of their recovery, the visitor guidelines for inpatient rooms apply.  Please refer to the [facility] website for the visitor guidelines for any additional information.   If you received a COVID test during your pre-op visit  it is requested that you wear a mask when out in public, stay away from anyone that may not be feeling well and notify your surgeon if you develop symptoms. If you have been in contact with anyone that has tested positive in the last 10 days please notify you surgeon.      Pre-operative CHG Bathing Instructions   You can play a key role in reducing the risk of infection after surgery. Your skin needs to be as free of germs as possible. You can reduce the number of germs on your skin by washing with CHG (chlorhexidine gluconate) soap before surgery. CHG is an antiseptic soap that kills germs and continues to kill germs even after washing.   DO NOT use if you have an allergy to chlorhexidine/CHG or antibacterial soaps. If your skin becomes reddened or irritated, stop  using the CHG and notify one of our RNs at [phone].              TAKE A SHOWER THE NIGHT BEFORE SURGERY   Please keep in mind the following:  DO NOT shave, including legs and underarms, 48 hours prior to surgery.   You may shave your face before/day of surgery.  Place clean sheets on your bed the night before surgery Use a clean washcloth (not used since being washed) for shower. DO NOT sleep with pet's night before surgery.  CHG Shower Instructions:  Wash your face and private area with normal soap. If you choose to wash your hair, wash first with  your normal shampoo.  After you use shampoo/soap, rinse your hair and body thoroughly to remove shampoo/soap residue.  Turn the water OFF and apply half the bottle of CHG soap to a CLEAN washcloth.  Apply CHG soap ONLY FROM YOUR NECK DOWN TO YOUR TOES (washing for 3-5 minutes)  DO NOT use CHG soap on face, private areas, open wounds, or sores.  Pay special attention to the area where your surgery is being performed.  If you are having back surgery, having someone wash your back for you may be helpful. Wait 2 minutes after CHG soap is applied, then you may rinse off the CHG soap.  Pat dry with a clean towel  Put on clean pajamas    Additional instructions for the day of surgery: If you choose, you may shower the morning of surgery with an antibacterial soap.  DO NOT APPLY any lotions, deodorants, cologne, or perfumes.   Do not wear jewelry or makeup Do not wear nail polish, gel polish, artificial nails, or any other type of covering on natural nails (fingers and toes) Do not bring valuables to the hospital. [facility] is not responsible for valuables/personal belongings. Put on clean/comfortable clothes.  Please brush your teeth.  Ask your nurse before applying any prescription medications to the skin.

## 2024-01-04 ENCOUNTER — Other Ambulatory Visit: Payer: Self-pay

## 2024-01-04 ENCOUNTER — Other Ambulatory Visit (HOSPITAL_COMMUNITY)

## 2024-01-04 ENCOUNTER — Encounter (HOSPITAL_COMMUNITY)
Admission: RE | Admit: 2024-01-04 | Discharge: 2024-01-04 | Disposition: A | Discharge status: Home / Self Care | Source: Ambulatory Visit | Attending: Neurosurgery | Admitting: Neurosurgery

## 2024-01-04 ENCOUNTER — Encounter (HOSPITAL_COMMUNITY): Payer: Self-pay

## 2024-01-04 VITALS — BP 145/55 | HR 64 | Temp 97.9°F | Resp 17 | Ht 67.0 in | Wt 157.0 lb

## 2024-01-04 DIAGNOSIS — Z01812 Encounter for preprocedural laboratory examination: Secondary | ICD-10-CM | POA: Insufficient documentation

## 2024-01-04 DIAGNOSIS — Z01818 Encounter for other preprocedural examination: Secondary | ICD-10-CM

## 2024-01-04 DIAGNOSIS — I10 Essential (primary) hypertension: Secondary | ICD-10-CM | POA: Insufficient documentation

## 2024-01-04 DIAGNOSIS — I739 Peripheral vascular disease, unspecified: Secondary | ICD-10-CM | POA: Diagnosis not present

## 2024-01-04 DIAGNOSIS — E785 Hyperlipidemia, unspecified: Secondary | ICD-10-CM | POA: Diagnosis not present

## 2024-01-04 HISTORY — DX: Essential (primary) hypertension: I10

## 2024-01-04 LAB — BASIC METABOLIC PANEL WITH GFR
Anion gap: 9 (ref 5–15)
BUN: 13 mg/dL (ref 8–23)
CO2: 22 mmol/L (ref 22–32)
Calcium: 8.8 mg/dL — ABNORMAL LOW (ref 8.9–10.3)
Chloride: 107 mmol/L (ref 98–111)
Creatinine, Ser: 1.13 mg/dL (ref 0.61–1.24)
GFR, Estimated: >60 mL/min (ref >60)
Glucose, Bld: 94 mg/dL (ref 70–99)
Potassium: 4.2 mmol/L (ref 3.5–5.1)
Sodium: 138 mmol/L (ref 135–145)

## 2024-01-04 LAB — SURGICAL PCR SCREEN
MRSA, PCR: NEGATIVE
Staphylococcus aureus: NEGATIVE

## 2024-01-04 LAB — TYPE AND SCREEN
ABO/RH(D): A POS
Antibody Screen: NEGATIVE

## 2024-01-04 LAB — CBC
HCT: 40.6 % (ref 39.0–52.0)
Hemoglobin: 13.8 g/dL (ref 13.0–17.0)
MCH: 29.2 pg (ref 26.0–34.0)
MCHC: 34 g/dL (ref 30.0–36.0)
MCV: 86 fL (ref 80.0–100.0)
Platelets: 229 K/uL (ref 150–400)
RBC: 4.72 MIL/uL (ref 4.22–5.81)
RDW: 12.5 % (ref 11.5–15.5)
WBC: 6.4 K/uL (ref 4.0–10.5)
nRBC: 0 % (ref 0.0–0.2)

## 2024-01-04 NOTE — Progress Notes (Signed)
 Surgical Instructions   Your procedure is scheduled on Tuesday, November 18th. Report to [facility] Main Entrance A at 5:30 A.M., then check in with the Admitting office. Any questions or running late day of surgery: call [phone]  Questions prior to your surgery date: call [phone], Monday-Friday, 8am-4pm. If you experience any cold or flu symptoms such as cough, fever, chills, shortness of breath, etc. between now and your scheduled surgery, please notify us  at the above number.     Remember:  Do not eat after midnight the night before your surgery.  You may drink clear liquids until 4:30 the morning of your surgery.   Clear liquids allowed are: Water, Non-Citrus Juices (without pulp), Clear Carbonated Beverages, Clear Tea (no milk, honey, etc.), Black Coffee Only (NO MILK, CREAM OR POWDERED CREAMER of any kind), and Gatorade.    Take these medicines the morning of surgery with A SIP OF WATER  amLODipine  (NORVASC )    Follow your surgeon's instructions on when to stop Asprin and clopidogrel  (PLAVIX ).  If no instructions were given by your surgeon then you will need to call the office to get those instructions.    One week prior to surgery, STOP taking any Aleve, Naproxen, Ibuprofen , Motrin , Advil , Goody's, BC's, all herbal medications, fish oil, and non-prescription vitamins.                     Do NOT Smoke (Tobacco/Vaping) for 24 hours prior to your procedure.  If you use a CPAP at night, you may bring your mask/headgear for your overnight stay.   You will be asked to remove any contacts, glasses, piercing's, hearing aid's, dentures/partials prior to surgery. Please bring cases for these items if needed.    Patients discharged the day of surgery will not be allowed to drive home, and someone needs to stay with them for 24 hours.  SURGICAL WAITING ROOM VISITATION Patients may have no more than 2 support people in the waiting area - these visitors may rotate.   Pre-op  nurse will coordinate an appropriate time for 1 ADULT support person, who may not rotate, to accompany patient in pre-op.  Children under the age of 50 must have an adult with them who is not the patient and must remain in the main waiting area with an adult.  If the patient needs to stay at the hospital during part of their recovery, the visitor guidelines for inpatient rooms apply.  Please refer to the [facility] website for the visitor guidelines for any additional information.   If you received a COVID test during your pre-op visit  it is requested that you wear a mask when out in public, stay away from anyone that may not be feeling well and notify your surgeon if you develop symptoms. If you have been in contact with anyone that has tested positive in the last 10 days please notify you surgeon.      Pre-operative 4 CHG Bathing Instructions   You can play a key role in reducing the risk of infection after surgery. Your skin needs to be as free of germs as possible. You can reduce the number of germs on your skin by washing with CHG (chlorhexidine gluconate) soap before surgery. CHG is an antiseptic soap that kills germs and continues to kill germs even after washing.   DO NOT use if you have an allergy to chlorhexidine/CHG or antibacterial soaps. If your skin becomes reddened or irritated, stop using the CHG and  notify one of our RNs at [phone].   Please shower with the CHG soap starting 4 days before surgery using the following schedule:     Please keep in mind the following:  DO NOT shave, including legs and underarms, starting the day of your first shower.   You may shave your face at any point before/day of surgery.  Place clean sheets on your bed the day you start using CHG soap. Use a clean washcloth (not used since being washed) for each shower. DO NOT sleep with pets once you start using the CHG.   CHG Shower Instructions:  Wash your face and private area with normal  soap. If you choose to wash your hair, wash first with your normal shampoo.  After you use shampoo/soap, rinse your hair and body thoroughly to remove shampoo/soap residue.  Turn the water OFF and apply  bottle of CHG soap to a CLEAN washcloth.  Apply CHG soap ONLY FROM YOUR NECK DOWN TO YOUR TOES (washing for 3-5 minutes)  DO NOT use CHG soap on face, private areas, open wounds, or sores.  Pay special attention to the area where your surgery is being performed.  If you are having back surgery, having someone wash your back for you may be helpful. Wait 2 minutes after CHG soap is applied, then you may rinse off the CHG soap.  Pat dry with a clean towel  Put on clean clothes/pajamas   If you choose to wear lotion, please use ONLY the CHG-compatible lotions that are listed below.  Additional instructions for the day of surgery:  If you choose, you may shower the morning of surgery with an antibacterial soap.  DO NOT APPLY any lotions, deodorants, or cologne.   Do not bring valuables to the hospital. [facility] is not responsible for any belongings/valuables. Do not wear nail polish, gel polish, artificial nails, or any other type of covering on natural nails (fingers and toes) Do not wear jewelry or makeup Put on clean/comfortable clothes.  Please brush your teeth.  Ask your nurse before applying any prescription medications to the skin.     CHG Compatible Lotions   Aveeno Moisturizing lotion  Cetaphil Moisturizing Cream  Cetaphil Moisturizing Lotion  Clairol Herbal Essence Moisturizing Lotion, Dry Skin  Clairol Herbal Essence Moisturizing Lotion, Extra Dry Skin  Clairol Herbal Essence Moisturizing Lotion, Normal Skin  Curel Age Defying Therapeutic Moisturizing Lotion with Alpha Hydroxy  Curel Extreme Care Body Lotion  Curel Soothing Hands Moisturizing Hand Lotion  Curel Therapeutic Moisturizing Cream, Fragrance-Free  Curel Therapeutic Moisturizing Lotion, Fragrance-Free  Curel  Therapeutic Moisturizing Lotion, Original Formula  Eucerin Daily Replenishing Lotion  Eucerin Dry Skin Therapy Plus Alpha Hydroxy Crme  Eucerin Dry Skin Therapy Plus Alpha Hydroxy Lotion  Eucerin Original Crme  Eucerin Original Lotion  Eucerin Plus Crme Eucerin Plus Lotion  Eucerin TriLipid Replenishing Lotion  Keri Anti-Bacterial Hand Lotion  Keri Deep Conditioning Original Lotion Dry Skin Formula Softly Scented  Keri Deep Conditioning Original Lotion, Fragrance Free Sensitive Skin Formula  Keri Lotion Fast Absorbing Fragrance Free Sensitive Skin Formula  Keri Lotion Fast Absorbing Softly Scented Dry Skin Formula  Keri Original Lotion  Keri Skin Renewal Lotion Keri Silky Smooth Lotion  Keri Silky Smooth Sensitive Skin Lotion  Nivea Body Creamy Conditioning Oil  Nivea Body Extra Enriched Teacher, Adult Education Moisturizing Lotion Nivea Crme  Nivea Skin Firming Lotion  NutraDerm 30 Skin Lotion  NutraDerm Skin Lotion  NutraDerm Therapeutic Skin Cream  NutraDerm Therapeutic Skin Lotion  ProShield Protective Hand Cream  Provon moisturizing lotion  Please read over the following fact sheets that you were given.

## 2024-01-04 NOTE — Progress Notes (Signed)
 PCP - Dr. Norleen General Cardiologist - Dr. Dorn Lesches LOV 12-13-23 for clearance  PPM/ICD - Denies Device Orders - n/a Rep Notified - n/a  Chest x-ray - N/A EKG - 12-13-23 Stress Test - 09-14-12 ECHO - 12-26-23 Cardiac Cath - denies  Sleep Study - denies CPAP - n/a  Non-diabetic  Last dose of GLP1 agonist-  denies GLP1 instructions: n/a  Blood Thinner Instructions: clopidogrel  (PLAVIX ) per patient last dose was on 01-02-24 Aspirin  Instructions: per patient last dose was on 01-02-24 per his surgeon's letter that was given to him.   Per patient he states his cardiologist usually wants him to remain on aspirin  until day of surgery when he stops his plavix .  Patient stated he received a paper from his surgeon to stop ASA/Plavix  for 7 day's.  Patient will reach out to surgeon to clarify.   ERAS Protcol - clears until 0430 PRE-SURGERY Ensure or G2- none  COVID TEST- n/a   Anesthesia review: yes, cardiac clearanc, HTN, CAD, PAD  Patient denies shortness of breath, fever, cough and chest pain at PAT appointment. Patient denies any respiratory issues at this time.    All instructions explained to the patient, with a verbal understanding of the material. Patient agrees to go over the instructions while at home for a better understanding. Patient also instructed to self quarantine after being tested for COVID-19. The opportunity to ask questions was provided.

## 2024-01-05 ENCOUNTER — Ambulatory Visit (HOSPITAL_COMMUNITY)
Admission: RE | Admit: 2024-01-05 | Discharge: 2024-01-05 | Disposition: A | Discharge status: Home / Self Care | Source: Ambulatory Visit | Attending: Cardiovascular Disease | Admitting: Cardiovascular Disease

## 2024-01-05 DIAGNOSIS — I739 Peripheral vascular disease, unspecified: Secondary | ICD-10-CM | POA: Diagnosis not present

## 2024-01-05 LAB — VAS US ABI WITH/WO TBI
Left ABI: 1.09
Right ABI: 0.97

## 2024-01-05 NOTE — Anesthesia Preprocedure Evaluation (Addendum)
 Anesthesia Evaluation  Patient identified by MRN, date of birth, ID band Patient awake    Reviewed: Allergy & Precautions, NPO status , Patient's Chart, lab work & pertinent test results  Airway Mallampati: II  TM Distance: >3 FB Neck ROM: Full    Dental no notable dental hx.    Pulmonary neg pulmonary ROS, former smoker   Pulmonary exam normal        Cardiovascular hypertension, Pt. on medications + Peripheral Vascular Disease   Rhythm:Regular Rate:Normal     Neuro/Psych  Headaches  negative psych ROS   GI/Hepatic negative GI ROS, Neg liver ROS,,,  Endo/Other  negative endocrine ROS    Renal/GU negative Renal ROS  negative genitourinary   Musculoskeletal  (+) Arthritis , Osteoarthritis,    Abdominal Normal abdominal exam  (+)   Peds  Hematology Lab Results      Component                Value               Date                      WBC                      6.4                 01/04/2024                HGB                      13.8                01/04/2024                HCT                      40.6                01/04/2024                MCV                      86.0                01/04/2024                PLT                      229                 01/04/2024             Lab Results      Component                Value               Date                      NA                       138                 01/04/2024                K  4.2                 01/04/2024                CO2                      22                  01/04/2024                GLUCOSE                  94                  01/04/2024                BUN                      13                  01/04/2024                CREATININE               1.13                01/04/2024                CALCIUM                   8.8 (L)             01/04/2024                GFRNONAA                 >60                 01/04/2024               Anesthesia Other Findings   Reproductive/Obstetrics                              Anesthesia Physical Anesthesia Plan  ASA: 3  Anesthesia Plan: General   Post-op Pain Management: Tylenol  PO (pre-op)* and Celebrex PO (pre-op)*   Induction: Intravenous  PONV Risk Score and Plan: 2 and Ondansetron , Dexamethasone  and Treatment may vary due to age or medical condition  Airway Management Planned: Mask, Oral ETT and Video Laryngoscope Planned  Additional Equipment: ClearSight  Intra-op Plan:   Post-operative Plan: Extubation in OR  Informed Consent: I have reviewed the patients History and Physical, chart, labs and discussed the procedure including the risks, benefits and alternatives for the proposed anesthesia with the patient or authorized representative who has indicated his/her understanding and acceptance.     Dental advisory given  Plan Discussed with: CRNA  Anesthesia Plan Comments: (PAT note by Lynwood Hope, PA-C: 68 year old male follows with cardiologist Dr. Court for history of HTN, HLD, PAD s/p multiple RLE interventions.  Last seen 12/13/2023 for preop evaluation.  Per note, Patient scheduled for cervical fusion 11/18. 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 His last echo in 2018 showed normal LV function. He has not had a stress test. I am going to repeat a 2D echocardiogram.  Echo 12/26/2023 showed LVEF 70 to 75%, grade 1  DD, normal RV systolic function, no significant valvular abnormalities.  Patient reports last dose Plavix  01/02/2024.  Preop labs reviewed, unremarkable.  EKG 12/13/2023: Sinus bradycardia.  Rate 57. Incomplete right bundle branch block. Possible Lateral infarct (cited on or before 16-Sep-2020)  TTE 12/26/2023: 1. Left ventricular ejection fraction, by estimation, is 70 to 75%. Left  ventricular ejection fraction by 3D volume is 58 %.  The left ventricle has  hyperdynamic function. The left ventricle has no regional wall motion  abnormalities. There is mild left  ventricular hypertrophy. Left ventricular diastolic parameters are  consistent with Grade I diastolic dysfunction (impaired relaxation).  Elevated left ventricular end-diastolic pressure.  2. Right ventricular systolic function is normal. The right ventricular  size is mildly enlarged. Tricuspid regurgitation signal is inadequate for  assessing PA pressure.  3. The mitral valve is normal in structure. Trivial mitral valve  regurgitation. No evidence of mitral stenosis.  4. The aortic valve is tricuspid. Aortic valve regurgitation is not  visualized. No aortic stenosis is present.  5. The inferior vena cava is normal in size with greater than 50%  respiratory variability, suggesting right atrial pressure of 3 mmHg.   Comparison(s): A prior study was performed on 11/17/2015. No significant  change from prior study. EF 60-65%. Mild LVH.   Carotid duplex 09/22/2023: Summary:  Right Carotid: Velocities in the right ICA are consistent with a 1-39% stenosis. Non-hemodynamically significant plaque <50% noted in the CCA.  Left Carotid: Velocities in the left ICA are consistent with a 1-39% stenosis. Hemodynamically significant plaque >50% visualized in the CCA. The ECA appears >50% stenosed.  Vertebrals: Right vertebral artery demonstrates antegrade flow. Left vertebralartery is antrgrade with an atypical waveform.   )         Anesthesia Quick Evaluation

## 2024-01-05 NOTE — Progress Notes (Signed)
 Anesthesia Chart Review:  68 year old male follows with cardiologist Dr. Court for history of HTN, HLD, PAD s/p multiple RLE interventions.  Last seen 12/13/2023 for preop evaluation.  Per note, Patient scheduled for cervical fusion 11/18. 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 His last echo in 2018 showed normal LV function. He has not had a stress test. I am going to repeat a 2D echocardiogram.  Echo 12/26/2023 showed LVEF 70 to 75%, grade 1 DD, normal RV systolic function, no significant valvular abnormalities.  Patient reports last dose Plavix  01/02/2024.  Preop labs reviewed, unremarkable.  EKG 12/13/2023: Sinus bradycardia.  Rate 57. Incomplete right bundle branch block. Possible Lateral infarct (cited on or before 16-Sep-2020)  TTE 12/26/2023: 1. Left ventricular ejection fraction, by estimation, is 70 to 75%. Left  ventricular ejection fraction by 3D volume is 58 %. The left ventricle has  hyperdynamic function. The left ventricle has no regional wall motion  abnormalities. There is mild left  ventricular hypertrophy. Left ventricular diastolic parameters are  consistent with Grade I diastolic dysfunction (impaired relaxation).  Elevated left ventricular end-diastolic pressure.   2. Right ventricular systolic function is normal. The right ventricular  size is mildly enlarged. Tricuspid regurgitation signal is inadequate for  assessing PA pressure.   3. The mitral valve is normal in structure. Trivial mitral valve  regurgitation. No evidence of mitral stenosis.   4. The aortic valve is tricuspid. Aortic valve regurgitation is not  visualized. No aortic stenosis is present.   5. The inferior vena cava is normal in size with greater than 50%  respiratory variability, suggesting right atrial pressure of 3 mmHg.   Comparison(s): A prior study was performed on 11/17/2015. No significant  change from  prior study. EF 60-65%. Mild LVH.   Carotid duplex 09/22/2023: Summary:  Right Carotid: Velocities in the right ICA are consistent with a 1-39% stenosis. Non-hemodynamically significant plaque <50% noted in the CCA.  Left Carotid: Velocities in the left ICA are consistent with a 1-39% stenosis. Hemodynamically significant plaque >50% visualized in the CCA. The ECA appears >50% stenosed.  Vertebrals: Right vertebral artery demonstrates antegrade flow. Left vertebral artery is antrgrade with an atypical waveform.     George Cooper [facility]/Anesthesiology Phone [phone] 01/05/2024 3:39 PM

## 2024-01-06 ENCOUNTER — Ambulatory Visit: Payer: Self-pay | Admitting: Cardiovascular Disease

## 2024-01-09 ENCOUNTER — Inpatient Hospital Stay (HOSPITAL_COMMUNITY): Payer: Self-pay | Admitting: Physician Assistant

## 2024-01-09 ENCOUNTER — Inpatient Hospital Stay (HOSPITAL_COMMUNITY)

## 2024-01-09 ENCOUNTER — Encounter (HOSPITAL_COMMUNITY): Payer: Self-pay

## 2024-01-09 ENCOUNTER — Other Ambulatory Visit: Payer: Self-pay

## 2024-01-09 ENCOUNTER — Inpatient Hospital Stay (HOSPITAL_COMMUNITY)
Admission: RE | Admit: 2024-01-09 | Discharge: 2024-01-10 | DRG: 473 | Disposition: A | Discharge status: Home / Self Care | Attending: Neurosurgery | Admitting: Neurosurgery

## 2024-01-09 ENCOUNTER — Inpatient Hospital Stay (HOSPITAL_COMMUNITY)
Admission: RE | Disposition: A | Discharge status: Home / Self Care | Payer: Self-pay | Source: Home / Self Care | Attending: Neurosurgery

## 2024-01-09 ENCOUNTER — Inpatient Hospital Stay (HOSPITAL_COMMUNITY): Admitting: Anesthesiology

## 2024-01-09 DIAGNOSIS — Z8619 Personal history of other infectious and parasitic diseases: Secondary | ICD-10-CM

## 2024-01-09 DIAGNOSIS — Z79899 Other long term (current) drug therapy: Secondary | ICD-10-CM | POA: Diagnosis not present

## 2024-01-09 DIAGNOSIS — Z888 Allergy status to other drugs, medicaments and biological substances status: Secondary | ICD-10-CM | POA: Diagnosis not present

## 2024-01-09 DIAGNOSIS — M4712 Other spondylosis with myelopathy, cervical region: Principal | ICD-10-CM | POA: Diagnosis present

## 2024-01-09 DIAGNOSIS — Z8249 Family history of ischemic heart disease and other diseases of the circulatory system: Secondary | ICD-10-CM

## 2024-01-09 DIAGNOSIS — E785 Hyperlipidemia, unspecified: Secondary | ICD-10-CM | POA: Diagnosis not present

## 2024-01-09 DIAGNOSIS — Z87891 Personal history of nicotine dependence: Secondary | ICD-10-CM | POA: Diagnosis not present

## 2024-01-09 DIAGNOSIS — Z7982 Long term (current) use of aspirin: Secondary | ICD-10-CM

## 2024-01-09 DIAGNOSIS — M4722 Other spondylosis with radiculopathy, cervical region: Secondary | ICD-10-CM | POA: Diagnosis not present

## 2024-01-09 DIAGNOSIS — I10 Essential (primary) hypertension: Secondary | ICD-10-CM

## 2024-01-09 DIAGNOSIS — Z7902 Long term (current) use of antithrombotics/antiplatelets: Secondary | ICD-10-CM | POA: Diagnosis not present

## 2024-01-09 DIAGNOSIS — Z833 Family history of diabetes mellitus: Secondary | ICD-10-CM

## 2024-01-09 DIAGNOSIS — M5412 Radiculopathy, cervical region: Principal | ICD-10-CM | POA: Diagnosis present

## 2024-01-09 DIAGNOSIS — M47812 Spondylosis without myelopathy or radiculopathy, cervical region: Secondary | ICD-10-CM | POA: Diagnosis not present

## 2024-01-09 HISTORY — PX: ANTERIOR CERVICAL DECOMP/DISCECTOMY FUSION: SHX1161

## 2024-01-09 LAB — ABO/RH: ABO/RH(D): A POS

## 2024-01-09 SURGERY — ANTERIOR CERVICAL DECOMPRESSION/DISCECTOMY FUSION 3 LEVELS
Anesthesia: General | Site: Neck

## 2024-01-09 MED ORDER — DROPERIDOL 2.5 MG/ML IJ SOLN
INTRAMUSCULAR | Status: AC
  Filled 2024-01-09: qty 2

## 2024-01-09 MED ORDER — CHLORHEXIDINE GLUCONATE 0.12 % MT SOLN
15.0000 mL | Freq: Once | OROMUCOSAL | Status: AC
  Administered 2024-01-09: 15 mL via OROMUCOSAL
  Filled 2024-01-09: qty 15

## 2024-01-09 MED ORDER — FENTANYL CITRATE (PF) 100 MCG/2ML IJ SOLN
INTRAMUSCULAR | Status: AC
  Filled 2024-01-09: qty 2

## 2024-01-09 MED ORDER — FENTANYL CITRATE (PF) 250 MCG/5ML IJ SOLN
INTRAMUSCULAR | Status: AC
  Filled 2024-01-09: qty 5

## 2024-01-09 MED ORDER — PROPOFOL 10 MG/ML IV BOLUS
INTRAVENOUS | Status: DC | PRN
Start: 2024-01-09 — End: 2024-01-09
  Administered 2024-01-09: 140 mg via INTRAVENOUS
  Administered 2024-01-09 (×3): 30 mg via INTRAVENOUS

## 2024-01-09 MED ORDER — MIDAZOLAM HCL 2 MG/2ML IJ SOLN
INTRAMUSCULAR | Status: AC
Start: 2024-01-09 — End: 2024-01-09
  Filled 2024-01-09: qty 2

## 2024-01-09 MED ORDER — CEFAZOLIN SODIUM-DEXTROSE 2-3 GM-%(50ML) IV SOLR
INTRAVENOUS | Status: DC | PRN
  Administered 2024-01-09: 2 g via INTRAVENOUS

## 2024-01-09 MED ORDER — LIDOCAINE-EPINEPHRINE 1 %-1:100000 IJ SOLN
INTRAMUSCULAR | Status: DC | PRN
  Administered 2024-01-09: 2 mL

## 2024-01-09 MED ORDER — CELECOXIB 200 MG PO CAPS: 200.0000 mg | ORAL_CAPSULE | Freq: Once | ORAL | Status: DC

## 2024-01-09 MED ORDER — PHENYLEPHRINE HCL-NACL 20-0.9 MG/250ML-% IV SOLN
INTRAVENOUS | Status: DC | PRN
  Administered 2024-01-09: 10 ug/min via INTRAVENOUS

## 2024-01-09 MED ORDER — PHENOL 1.4 % MT LIQD
1.0000 | OROMUCOSAL | Status: DC | PRN
Start: 2024-01-09 — End: 2024-01-10

## 2024-01-09 MED ORDER — POLYETHYLENE GLYCOL 3350 17 G PO PACK: 17.0000 g | PACK | Freq: Every day | ORAL | Status: DC | PRN

## 2024-01-09 MED ORDER — AMLODIPINE BESYLATE 5 MG PO TABS
5.0000 mg | ORAL_TABLET | Freq: Every morning | ORAL | Status: DC
  Administered 2024-01-10: 5 mg via ORAL
  Filled 2024-01-09: qty 1

## 2024-01-09 MED ORDER — EPHEDRINE SULFATE-NACL 50-0.9 MG/10ML-% IV SOSY
PREFILLED_SYRINGE | INTRAVENOUS | Status: DC | PRN
Start: 2024-01-09 — End: 2024-01-09
  Administered 2024-01-09: 5 mg via INTRAVENOUS

## 2024-01-09 MED ORDER — BUPIVACAINE HCL 0.5 % IJ SOLN
INTRAMUSCULAR | Status: DC | PRN
  Administered 2024-01-09: 2 mL

## 2024-01-09 MED ORDER — EPHEDRINE 5 MG/ML INJ
INTRAVENOUS | Status: AC
  Filled 2024-01-09: qty 5

## 2024-01-09 MED ORDER — METHOCARBAMOL 500 MG PO TABS
500.0000 mg | ORAL_TABLET | Freq: Four times a day (QID) | ORAL | Status: DC | PRN
  Administered 2024-01-09 – 2024-01-10 (×2): 500 mg via ORAL
  Filled 2024-01-09 (×2): qty 1

## 2024-01-09 MED ORDER — FLEET ENEMA RE ENEM
1.0000 | ENEMA | Freq: Once | RECTAL | Status: DC | PRN
Start: 2024-01-09 — End: 2024-01-10

## 2024-01-09 MED ORDER — LACTATED RINGERS IV SOLN: INTRAVENOUS | Status: DC

## 2024-01-09 MED ORDER — PROPOFOL 10 MG/ML IV BOLUS
INTRAVENOUS | Status: AC
Start: 2024-01-09 — End: 2024-01-09
  Filled 2024-01-09: qty 20

## 2024-01-09 MED ORDER — SODIUM CHLORIDE 0.9 % IV SOLN
250.0000 mL | INTRAVENOUS | Status: DC
  Administered 2024-01-09: 250 mL via INTRAVENOUS

## 2024-01-09 MED ORDER — THROMBIN 5000 UNITS EX KIT
PACK | CUTANEOUS | Status: AC
Start: 2024-01-09 — End: 2024-01-09
  Filled 2024-01-09: qty 1

## 2024-01-09 MED ORDER — CEFAZOLIN SODIUM-DEXTROSE 2-4 GM/100ML-% IV SOLN
2.0000 g | Freq: Four times a day (QID) | INTRAVENOUS | Status: AC
  Administered 2024-01-09 (×2): 2 g via INTRAVENOUS
  Filled 2024-01-09 (×2): qty 100

## 2024-01-09 MED ORDER — ONDANSETRON HCL 4 MG/2ML IJ SOLN
INTRAMUSCULAR | Status: DC | PRN
Start: 2024-01-09 — End: 2024-01-09
  Administered 2024-01-09: 4 mg via INTRAVENOUS

## 2024-01-09 MED ORDER — LIDOCAINE 2% (20 MG/ML) 5 ML SYRINGE
INTRAMUSCULAR | Status: DC | PRN
  Administered 2024-01-09: 60 mg via INTRAVENOUS

## 2024-01-09 MED ORDER — DROPERIDOL 2.5 MG/ML IJ SOLN
0.6250 mg | Freq: Once | INTRAMUSCULAR | Status: AC | PRN
  Administered 2024-01-09: 0.625 mg via INTRAVENOUS

## 2024-01-09 MED ORDER — MENTHOL 3 MG MT LOZG: 1.0000 | LOZENGE | OROMUCOSAL | Status: DC | PRN

## 2024-01-09 MED ORDER — ONDANSETRON HCL 4 MG PO TABS
4.0000 mg | ORAL_TABLET | Freq: Four times a day (QID) | ORAL | Status: DC | PRN
  Administered 2024-01-10: 4 mg via ORAL
  Filled 2024-01-09: qty 1

## 2024-01-09 MED ORDER — PHENYLEPHRINE 80 MCG/ML (10ML) SYRINGE FOR IV PUSH (FOR BLOOD PRESSURE SUPPORT)
PREFILLED_SYRINGE | INTRAVENOUS | Status: DC | PRN
Start: 2024-01-09 — End: 2024-01-09
  Administered 2024-01-09 (×2): 80 ug via INTRAVENOUS
  Administered 2024-01-09: 160 ug via INTRAVENOUS

## 2024-01-09 MED ORDER — SODIUM CHLORIDE 0.9% FLUSH: 3.0000 mL | INTRAVENOUS | Status: DC | PRN

## 2024-01-09 MED ORDER — CEFAZOLIN SODIUM 1 G IJ SOLR
INTRAMUSCULAR | Status: AC
Start: 2024-01-09 — End: 2024-01-09
  Filled 2024-01-09: qty 20

## 2024-01-09 MED ORDER — ACETAMINOPHEN 500 MG PO TABS: 1000.0000 mg | ORAL_TABLET | Freq: Once | ORAL | Status: DC

## 2024-01-09 MED ORDER — KETOROLAC TROMETHAMINE 15 MG/ML IJ SOLN
7.5000 mg | Freq: Four times a day (QID) | INTRAMUSCULAR | Status: AC
  Administered 2024-01-09 – 2024-01-10 (×4): 7.5 mg via INTRAVENOUS
  Filled 2024-01-09 (×4): qty 1

## 2024-01-09 MED ORDER — ACETAMINOPHEN 650 MG RE SUPP: 650.0000 mg | RECTAL | Status: DC | PRN

## 2024-01-09 MED ORDER — FENTANYL CITRATE (PF) 100 MCG/2ML IJ SOLN
25.0000 ug | INTRAMUSCULAR | Status: DC | PRN
  Administered 2024-01-09 (×2): 25 ug via INTRAVENOUS

## 2024-01-09 MED ORDER — THROMBIN 5000 UNITS EX KIT
PACK | CUTANEOUS | Status: AC
  Filled 2024-01-09: qty 1

## 2024-01-09 MED ORDER — SUGAMMADEX SODIUM 200 MG/2ML IV SOLN
INTRAVENOUS | Status: DC | PRN
  Administered 2024-01-09: 200 mg via INTRAVENOUS

## 2024-01-09 MED ORDER — OXYCODONE HCL 5 MG PO TABS: 10.0000 mg | ORAL_TABLET | ORAL | Status: DC | PRN

## 2024-01-09 MED ORDER — ONDANSETRON HCL 4 MG/2ML IJ SOLN: 4.0000 mg | Freq: Four times a day (QID) | INTRAMUSCULAR | Status: DC | PRN

## 2024-01-09 MED ORDER — KETAMINE HCL 50 MG/5ML IJ SOSY
PREFILLED_SYRINGE | INTRAMUSCULAR | Status: DC | PRN
  Administered 2024-01-09: 10 mg via INTRAVENOUS
  Administered 2024-01-09: 20 mg via INTRAVENOUS

## 2024-01-09 MED ORDER — FENTANYL CITRATE (PF) 250 MCG/5ML IJ SOLN
INTRAMUSCULAR | Status: DC | PRN
  Administered 2024-01-09 (×6): 50 ug via INTRAVENOUS
  Administered 2024-01-09: 100 ug via INTRAVENOUS
  Administered 2024-01-09: 50 ug via INTRAVENOUS

## 2024-01-09 MED ORDER — METHOCARBAMOL 1000 MG/10ML IJ SOLN: 500.0000 mg | Freq: Four times a day (QID) | INTRAMUSCULAR | Status: DC | PRN

## 2024-01-09 MED ORDER — LIDOCAINE-EPINEPHRINE 1 %-1:100000 IJ SOLN
INTRAMUSCULAR | Status: AC
  Filled 2024-01-09: qty 1

## 2024-01-09 MED ORDER — LACTATED RINGERS IV SOLN: INTRAVENOUS | Status: DC | PRN

## 2024-01-09 MED ORDER — 0.9 % SODIUM CHLORIDE (POUR BTL) OPTIME
TOPICAL | Status: DC | PRN
  Administered 2024-01-09: 1000 mL

## 2024-01-09 MED ORDER — AMISULPRIDE (ANTIEMETIC) 5 MG/2ML IV SOLN
INTRAVENOUS | Status: AC
  Filled 2024-01-09: qty 4

## 2024-01-09 MED ORDER — DOCUSATE SODIUM 100 MG PO CAPS
100.0000 mg | ORAL_CAPSULE | Freq: Two times a day (BID) | ORAL | Status: DC
  Administered 2024-01-09 – 2024-01-10 (×2): 100 mg via ORAL
  Filled 2024-01-09 (×2): qty 1

## 2024-01-09 MED ORDER — KETAMINE HCL 50 MG/5ML IJ SOSY
PREFILLED_SYRINGE | INTRAMUSCULAR | Status: AC
  Filled 2024-01-09: qty 5

## 2024-01-09 MED ORDER — HYDROMORPHONE HCL 1 MG/ML IJ SOLN: 0.5000 mg | INTRAMUSCULAR | Status: DC | PRN

## 2024-01-09 MED ORDER — THROMBIN (RECOMBINANT) 5000 UNITS EX SOLR
CUTANEOUS | Status: AC
  Filled 2024-01-09: qty 5000

## 2024-01-09 MED ORDER — ROCURONIUM BROMIDE 10 MG/ML (PF) SYRINGE
PREFILLED_SYRINGE | INTRAVENOUS | Status: DC | PRN
Start: 2024-01-09 — End: 2024-01-09
  Administered 2024-01-09 (×3): 10 mg via INTRAVENOUS
  Administered 2024-01-09: 70 mg via INTRAVENOUS

## 2024-01-09 MED ORDER — PHENYLEPHRINE 80 MCG/ML (10ML) SYRINGE FOR IV PUSH (FOR BLOOD PRESSURE SUPPORT)
PREFILLED_SYRINGE | INTRAVENOUS | Status: AC
  Filled 2024-01-09: qty 10

## 2024-01-09 MED ORDER — SODIUM CHLORIDE 0.9% FLUSH
3.0000 mL | Freq: Two times a day (BID) | INTRAVENOUS | Status: DC
  Administered 2024-01-09 (×2): 3 mL via INTRAVENOUS

## 2024-01-09 MED ORDER — DEXAMETHASONE SOD PHOSPHATE PF 10 MG/ML IJ SOLN
INTRAMUSCULAR | Status: DC | PRN
  Administered 2024-01-09: 10 mg via INTRAVENOUS
  Administered 2024-01-09 (×2): 2.5 mg via INTRAVENOUS

## 2024-01-09 MED ORDER — OXYCODONE HCL 5 MG PO TABS
5.0000 mg | ORAL_TABLET | ORAL | Status: DC | PRN
  Administered 2024-01-09 – 2024-01-10 (×2): 5 mg via ORAL
  Filled 2024-01-09 (×2): qty 1

## 2024-01-09 MED ORDER — AMISULPRIDE (ANTIEMETIC) 5 MG/2ML IV SOLN
10.0000 mg | Freq: Once | INTRAVENOUS | Status: AC
  Administered 2024-01-09: 10 mg via INTRAVENOUS
  Filled 2024-01-09: qty 4

## 2024-01-09 MED ORDER — LIDOCAINE 2% (20 MG/ML) 5 ML SYRINGE
INTRAMUSCULAR | Status: AC
  Filled 2024-01-09: qty 5

## 2024-01-09 MED ORDER — PRAVASTATIN SODIUM 10 MG PO TABS
20.0000 mg | ORAL_TABLET | Freq: Every evening | ORAL | Status: DC
  Administered 2024-01-09: 20 mg via ORAL
  Filled 2024-01-09: qty 2

## 2024-01-09 MED ORDER — STERILE WATER FOR IRRIGATION IR SOLN
Status: DC | PRN
  Administered 2024-01-09: 1000 mL

## 2024-01-09 MED ORDER — ORAL CARE MOUTH RINSE: 15.0000 mL | Freq: Once | OROMUCOSAL | Status: AC

## 2024-01-09 MED ORDER — ACETAMINOPHEN 325 MG PO TABS
650.0000 mg | ORAL_TABLET | ORAL | Status: DC | PRN
  Administered 2024-01-10: 650 mg via ORAL
  Filled 2024-01-09: qty 2

## 2024-01-09 MED ORDER — ROCURONIUM BROMIDE 10 MG/ML (PF) SYRINGE
PREFILLED_SYRINGE | INTRAVENOUS | Status: AC
  Filled 2024-01-09: qty 10

## 2024-01-09 MED ORDER — THROMBIN 5000 UNITS EX SOLR: OROMUCOSAL | Status: DC | PRN

## 2024-01-09 MED ORDER — BUPIVACAINE HCL (PF) 0.5 % IJ SOLN
INTRAMUSCULAR | Status: AC
  Filled 2024-01-09: qty 30

## 2024-01-09 SURGICAL SUPPLY — 54 items
BAG COUNTER SPONGE SURGICOUNT (BAG) ×1 IMPLANT
BAND RUBBER #18 3X1/16 STRL (MISCELLANEOUS) ×2 IMPLANT
BASKET BONE COLLECTION (BASKET) IMPLANT
BENZOIN TINCTURE PRP APPL 2/3 (GAUZE/BANDAGES/DRESSINGS) ×1 IMPLANT
BIT DRILL NEURO 2X3.1 SFT TUCH (MISCELLANEOUS) ×1 IMPLANT
BLADE CLIPPER SURG (BLADE) IMPLANT
BUR MATCHSTICK NEURO 3.0 LAGG (BURR) ×1 IMPLANT
CANISTER SUCTION 3000ML PPV (SUCTIONS) ×1 IMPLANT
DEVICE ENDSKLTN IMPL 16X14X7X6 (Cage) IMPLANT
DEVICE ENDSKLTN TC NANOLCK 6MM (Cage) IMPLANT
DRAPE C-ARM 42X72 X-RAY (DRAPES) ×2 IMPLANT
DRAPE HALF SHEET 40X57 (DRAPES) IMPLANT
DRAPE LAPAROTOMY 100X72 PEDS (DRAPES) ×1 IMPLANT
DRAPE MICROSCOPE LEICA (MISCELLANEOUS) ×1 IMPLANT
DRESSING MEPILEX FLEX 4X4 (GAUZE/BANDAGES/DRESSINGS) ×1 IMPLANT
DRSG OPSITE 4X5.5 SM (GAUZE/BANDAGES/DRESSINGS) ×2 IMPLANT
DRSG OPSITE POSTOP 4X6 (GAUZE/BANDAGES/DRESSINGS) IMPLANT
DURAPREP 26ML APPLICATOR (WOUND CARE) ×1 IMPLANT
ELECT COATED BLADE 2.86 ST (ELECTRODE) ×1 IMPLANT
ELECTRODE REM PT RTRN 9FT ADLT (ELECTROSURGICAL) ×1 IMPLANT
GAUZE 4X4 16PLY ~~LOC~~+RFID DBL (SPONGE) IMPLANT
GLOVE BIO SURGEON STRL SZ7 (GLOVE) ×2 IMPLANT
GLOVE BIOGEL PI IND STRL 7.5 (GLOVE) ×1 IMPLANT
GLOVE BIOGEL PI IND STRL 8 (GLOVE) ×3 IMPLANT
GLOVE ECLIPSE 8.0 STRL XLNG CF (GLOVE) ×1 IMPLANT
GOWN STRL REUS W/ TWL LRG LVL3 (GOWN DISPOSABLE) ×2 IMPLANT
GOWN STRL REUS W/ TWL XL LVL3 (GOWN DISPOSABLE) ×1 IMPLANT
GOWN STRL REUS W/TWL 2XL LVL3 (GOWN DISPOSABLE) IMPLANT
HEMOSTAT POWDER KIT SURGIFOAM (HEMOSTASIS) ×1 IMPLANT
KIT BASIN OR (CUSTOM PROCEDURE TRAY) ×1 IMPLANT
KIT TURNOVER KIT B (KITS) ×1 IMPLANT
NDL HYPO 22X1.5 SAFETY MO (MISCELLANEOUS) ×1 IMPLANT
NDL SPNL 22GX3.5 QUINCKE BK (NEEDLE) ×1 IMPLANT
NEEDLE HYPO 22X1.5 SAFETY MO (MISCELLANEOUS) ×1 IMPLANT
NEEDLE SPNL 22GX3.5 QUINCKE BK (NEEDLE) ×1 IMPLANT
PACK LAMINECTOMY NEURO (CUSTOM PROCEDURE TRAY) ×1 IMPLANT
PAD ARMBOARD POSITIONER FOAM (MISCELLANEOUS) ×3 IMPLANT
PIN DISTRACTION TAP 12 DISP (PIN) ×2 IMPLANT
PLATE ZEVO 3LVL 55MM (Plate) IMPLANT
PUTTY DBF 3CC CORTICAL FIBERS (Putty) IMPLANT
SCREW 3.5 SELFDRILL 17MM VARI (Screw) IMPLANT
SOLN 0.9% NACL POUR BTL 1000ML (IV SOLUTION) ×1 IMPLANT
SOLN STERILE WATER BTL 1000 ML (IV SOLUTION) ×1 IMPLANT
SPIKE FLUID TRANSFER (MISCELLANEOUS) ×1 IMPLANT
SPONGE INTESTINAL PEANUT (DISPOSABLE) ×1 IMPLANT
STAPLER SKIN PROX 35W (STAPLE) IMPLANT
STRIP CLOSURE SKIN 1/2X4 (GAUZE/BANDAGES/DRESSINGS) ×1 IMPLANT
SUT MNCRL AB 4-0 PS2 18 (SUTURE) ×1 IMPLANT
SUT SILK 2 0 TIES 10X30 (SUTURE) IMPLANT
SUT VIC AB 3-0 SH 8-18 (SUTURE) ×1 IMPLANT
TAPE CLOTH 3X10 TAN LF (GAUZE/BANDAGES/DRESSINGS) ×1 IMPLANT
TIP KERRISON THIN FOOTPLATE 2M (MISCELLANEOUS) ×1 IMPLANT
TOWEL GREEN STERILE (TOWEL DISPOSABLE) ×1 IMPLANT
TOWEL GREEN STERILE FF (TOWEL DISPOSABLE) ×1 IMPLANT

## 2024-01-09 NOTE — Transfer of Care (Signed)
 Immediate Anesthesia Transfer of Care Note  Patient: George Cooper  Procedure(s) Performed: CERVICAL FOUR-FIVE, CERVICAL FIVE-SIX, CERVICAL SIX-SEVEN ANTERIOR CERVICAL DECOMPRESSION/DISCECTOMY FUSION (Neck)  Patient Location: PACU  Anesthesia Type:General  Level of Consciousness: awake, alert , and oriented  Airway & Oxygen Therapy: Patient Spontanous Breathing and Patient connected to face mask oxygen  Post-op Assessment: Report given to RN and Post -op Vital signs reviewed and stable  Post vital signs: Reviewed and stable  Last Vitals:  Vitals Value Taken Time  BP 180/87 01/09/24 12:30  Temp 36.4 C 01/09/24 12:26  Pulse 80 01/09/24 12:35  Resp 18 01/09/24 12:35  SpO2 96 % 01/09/24 12:35  Vitals shown include unfiled device data.  Last Pain:  Vitals:   01/09/24 0618  TempSrc:   PainSc: 0-No pain      Patients Stated Pain Goal: 0 (01/09/24 0618)  Complications: No notable events documented.

## 2024-01-09 NOTE — H&P (Signed)
 CC: hand weakness  HPI:     Patient is a 68 y.o. male presents with progressive myeloradiculopathy.  Since his last clinic visit, he has noticed increased weakness of his hands.    Patient Active Problem List   Diagnosis Date Noted   Preoperative clearance 12/13/2023   Claudication in peripheral vascular disease 06/17/2019   Essential hypertension 02/26/2019   Cerebral infarction (HCC) 11/16/2015   Left-sided weakness 11/16/2015   Headache 11/16/2015   Hyperlipidemia 10/10/2012   Pain of right thigh 09/21/2012   PAD (peripheral artery disease), s/p athrectomy, PTA/ stent Rt. SFA  09/18/2012 09/19/2012   Carotid artery disease 08/01/2012   Dizziness 08/01/2012   Claudication    Past Medical History:  Diagnosis Date   Arthritis    right knee (09/18/2012)   Carotid artery disease    Claudication    a. 11/09/15 Hawk atherectomy of SFA   Dizziness    History of gout    Hyperlipidemia    Hypertension    Lyme disease 2006   after bit by a tick (09/18/2012)   Renal artery stenosis    Tobacco abuse    discontinued October 2014    Past Surgical History:  Procedure Laterality Date   ABDOMINAL ANGIOGRAM  09/18/2012   Procedure: ABDOMINAL ANGIOGRAM;  Surgeon: Dorn JINNY Lesches, MD;  Location: [facility] CATH LAB;  Service: Cardiovascular;;   ABDOMINAL AORTOGRAM W/LOWER EXTREMITY N/A 06/17/2019   Procedure: ABDOMINAL AORTOGRAM W/LOWER EXTREMITY;  Surgeon: Lesches Dorn JINNY, MD;  Location: [facility] INVASIVE CV LAB;  Service: Cardiovascular;  Laterality: N/A;   ATHERECTOMY Right 09/18/2012   Procedure: ATHERECTOMY;  Surgeon: Dorn JINNY Lesches, MD;  Location: [facility] CATH LAB;  Service: Cardiovascular;  Laterality: Right;  right SFA   KNEE ARTHROSCOPY Right 1990's   twice (09/18/2012)   KNEE ARTHROSCOPY Left 1980's   LOWER EXTREMITY ANGIOGRAM N/A 09/18/2012   Procedure: LOWER EXTREMITY ANGIOGRAM;  Surgeon: Dorn JINNY Lesches, MD;  Location: [facility] CATH LAB;  Service: Cardiovascular;  Laterality: N/A;    PERCUTANEOUS STENT INTERVENTION  09/18/2012   Procedure: PERCUTANEOUS STENT INTERVENTION;  Surgeon: Dorn JINNY Lesches, MD;  Location: [facility] CATH LAB;  Service: Cardiovascular;;  right SFA   PERIPHERAL ARTERIAL STENT GRAFT Right 09/18/2012   PTA & stenting right SFA/notes 09/18/2012   PERIPHERAL VASCULAR CATHETERIZATION N/A 11/09/2015   Procedure: Abdominal Aortogram w/Lower Extremity;  Surgeon: Dorn JINNY Lesches, MD;  Location: [facility] INVASIVE CV LAB;  Service: Cardiovascular;  Laterality: N/A;   PERIPHERAL VASCULAR CATHETERIZATION Right 11/09/2015   Procedure: Peripheral Vascular Balloon Angioplasty;  Surgeon: Dorn JINNY Lesches, MD;  Location: [facility] INVASIVE CV LAB;  Service: Cardiovascular;  Laterality: Right;  SFA   PERIPHERAL VASCULAR CATHETERIZATION Right 11/09/2015   Procedure: Peripheral Vascular Atherectomy;  Surgeon: Dorn JINNY Lesches, MD;  Location: [facility] INVASIVE CV LAB;  Service: Cardiovascular;  Laterality: Right;  SFA   PERIPHERAL VASCULAR INTERVENTION Right 06/17/2019   Procedure: PERIPHERAL VASCULAR INTERVENTION;  Surgeon: Lesches Dorn JINNY, MD;  Location: [facility] INVASIVE CV LAB;  Service: Cardiovascular;  Laterality: Right;  superficial femoral    Medications Prior to Admission  Medication Sig Dispense Refill Last Dose/Taking   amLODipine  (NORVASC ) 10 MG tablet TAKE (1) TABLET BY MOUTH ONCE DAILY. (Patient taking differently: Take 5 mg by mouth in the morning.) 90 tablet 1 01/09/2024 at  4:00 AM   aspirin  EC 81 MG tablet Take 1 tablet (81 mg total) by mouth daily. 90 tablet 3 01/02/2024   clopidogrel  (PLAVIX ) 75 MG tablet TAKE 1 TABLET  WITH BREAKFAST. 90 tablet 0 01/02/2024   Evolocumab  (REPATHA  SURECLICK) 140 MG/ML SOAJ INJECT CONTENTS OF 1 PEN SUBCUTANEOUSLY EVERY 14 DAYS 6 mL 1 Past Month   pravastatin  (PRAVACHOL ) 20 MG tablet Take 20 mg by mouth every evening.   Past Week   Allergies  Allergen Reactions   Versed  [Midazolam ] Nausea And Vomiting    Patient had 2 different procedures, first procedure  okay, second procedure became diaphoretic with nausea and vomiting    Social History   Tobacco Use   Smoking status: Former    Current packs/day: 0.00    Average packs/day: 0.2 packs/day for 40.0 years (8.0 ttl pk-yrs)    Types: Cigarettes    Start date: 11/29/1972    Quit date: 11/29/2012    Years since quitting: 11.1   Smokeless tobacco: Never  Substance Use Topics   Alcohol use: Not Currently    Family History  Problem Relation Age of Onset   Heart Problems Mother        CABG, PCI   Stroke Father    Heart failure Maternal Grandmother    Heart attack Maternal Grandfather    Heart failure Paternal Grandmother    Heart attack Paternal Grandfather    Stroke Paternal Grandfather    Diabetes Paternal Grandfather    Heart Problems Sister        PCI     Review of Systems Pertinent items are noted in HPI.  Objective:   Patient Vitals for the past 8 hrs:  BP Temp Temp src Pulse Resp SpO2 Height Weight  01/09/24 0555 (!) 162/85 97.6 F (36.4 C) Oral 60 17 98 % 5' 7 (1.702 m) 70.3 kg   No intake/output data recorded. No intake/output data recorded.      General : Alert, cooperative, no distress, appears stated age   Head:  Normocephalic/atraumatic    Eyes: PERRL, conjunctiva/corneas clear, EOM's intact. Fundi could not be visualized Neck: Supple Chest:  Respirations unlabored Chest wall: no tenderness or deformity Heart: Regular rate and rhythm Abdomen: Soft, nontender and nondistended Extremities: warm and well-perfused Skin: normal turgor, color and texture Neurologic:  Alert, oriented x 3.  Eyes open spontaneously. PERRL, EOMI, VFC, no facial droop. V1-3 intact.  No dysarthria, tongue protrusion symmetric.  CNII-XII intact. 4/5 HG, otw 5/5.  3+ reflexes, + Hoffman's       Data ReviewCBC:  Lab Results  Component Value Date   WBC 6.4 01/04/2024   RBC 4.72 01/04/2024   BMP:  Lab Results  Component Value Date   GLUCOSE 94 01/04/2024   CO2 22 01/04/2024    BUN 13 01/04/2024   BUN 16 11/06/2015   CREATININE 1.13 01/04/2024   CREATININE 1.04 09/05/2012   CALCIUM  8.8 (L) 01/04/2024   Radiology review: see clinic note for details  Assessment:   Active Problems:   * No active hospital problems. 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  Risks discussed included, but were not limited to bleeding, pain, infection, dysphagia, dysphonia, scar, spinal fluid leak, neurologic deficit, instability, pseudoarthrosis, adjacent segment disease, damage to nearby organs, and death.  Informed consent was obtained.

## 2024-01-09 NOTE — Anesthesia Procedure Notes (Signed)
 Procedure Name: Intubation Date/Time: 01/09/2024 8:40 AM  Performed by: Nikolai Wilczak J, CRNAPre-anesthesia Checklist: Patient identified, Emergency Drugs available, Suction available and Patient being monitored Patient Re-evaluated:Patient Re-evaluated prior to induction Oxygen Delivery Method: Circle System Utilized Preoxygenation: Pre-oxygenation with 100% oxygen Induction Type: IV induction Ventilation: Mask ventilation without difficulty and Oral airway inserted - appropriate to patient size Laryngoscope Size: Glidescope and 4 Grade View: Grade I Tube type: Oral Tube size: 7.5 mm Number of attempts: 1 Airway Equipment and Method: Stylet and Oral airway Placement Confirmation: ETT inserted through vocal cords under direct vision, positive ETCO2 and breath sounds checked- equal and bilateral Secured at: 23 cm Tube secured with: Tape Dental Injury: Teeth and Oropharynx as per pre-operative assessment

## 2024-01-09 NOTE — Op Note (Signed)
 PREOP DIAGNOSIS: Cervical spondylitic myelopathy  POSTOP DIAGNOSIS: Cervical spondylitic myelopathy  PROCEDURE: 1. Arthrodesis, additional level C4-5 anterior interbody technique, including Discectomy for decompression of spinal cord and exiting nerve roots with foraminotomies  2. Arthrodesis, additional level C5-6 anterior interbody technique, including Discectomy for decompression of spinal cord and exiting nerve roots with foraminotomies  3. Arthrodesis, additional level C6-7 anterior interbody technique, including Discectomy for decompression of spinal cord and exiting nerve roots with foraminotomies  4. Placement of intervertebral biomechanical device C4-5 5. Placement of intervertebral biomechanical device C5-6 6. Placement of intervertebral biomechanical device C6-7 7. Placement of anterior instrumentation consisting of interbody plate and screws C3-4-5-6-7 8. Use of morselized bone allograft  9. Use of intraoperative microscope  SURGEON: Dr. Dorn Ned, MD  ASSISTANT:  DOROTHA Glade, MD, Camie Pickle, PA.  Please note there were no qualified trainees available to assist with the procedure.  Assistance required for retraction of the visceral structures to allow for safe instrumentation.  ANESTHESIA: General Endotracheal  EBL: 25 ml  IMPLANTS: Medtronic 55 mm Zevo plate 6 ff/2ff/3ff Titan-C medium footprint cages 17 mm screws x 8  SPECIMENS: None  DRAINS: None  COMPLICATIONS: None immediate  CONDITION: Hemodynamically stable to PACU  HISTORY: 68 y.o. male presents with progressive myeloradiculopathy with bilateral hand weakness, gait difficulty.  Found to have severe cervical stenosis.   Risks, benefits, alternatives, and expected convalescence were discussed with the patient.  Risks discussed included but were not limited to bleeding, pain, infection, dysphagia, dysphonia, pseudoarthrosis, hardware failure, adjacent segment disease, CSF leak, neurologic deficits,  weakness, numbness, paralysis, coma, and death. After all questions were answered, informed consent was obtained.  PROCEDURE IN DETAIL: The patient was brought to the operating room and transferred to the operative table. After induction of general anesthesia, the patient was positioned on the operative table in the supine position with all pressure points meticulously padded. The skin of the neck was then prepped and draped in the usual sterile fashion.  After timeout was conducted, the skin was infiltrated with local anesthetic. Skin incision was then made sharply and Bovie electrocautery was used to dissect the subcutaneous tissue until the platysma was identified. The platysma was then divided and undermined. The sternocleidomastoid muscle was then identified and, utilizing natural fascial planes in the neck, the prevertebral fascia was identified and the carotid sheath was retracted laterally and the trachea and esophagus retracted medially. Again using fluoroscopy, the C4-5, C5-6, and C6-7 disc spaces were identified. Bovie electrocautery was used to dissect in the subperiosteal plane and elevate the bilateral longus coli muscles. Self-retaining retractors were then placed. Caspar distraction pins were placed in the adjacent bodies to allow for gentle distraction.  At this point, the microscope was draped and brought into the field, and the remainder of the case was done under the microscope using microdissecting technique.  The C6-7 disc space was incised sharply and combination of high speed drill, curettes, and rongeurs were use to initially complete a discectomy. The high-speed drill was then used to complete discectomy until the posterior annulus was identified and removed and the posterior longitudinal ligament was identified. Using a nerve hook, the PLL was elevated, and Kerrison rongeurs were used to remove the posterior longitudinal ligament and the ventral thecal sac was identified.  Using a  combination of curettes and rongeurs, complete decompression of the thecal sac and exiting nerve roots at this level was completed, and verified with easy passage of micro-nerve hook centrally and in the bilateral foramina.  Having completed our decompression, attention was turned to placement of the intervertebral device. Trial spacers were used to select a size 6 mm graft. This graft was then filled with morcellized allograft, and inserted under live fluoroscopy.  Attention was then turned to the C5-6 level. Caspar distraction pin was placed in the adjacent body to allow for gentle distraction of the disc space.  In a similar fashion, discectomy was completed initially with curettes and rongeurs, and completed with the drill. The PLL was again identified, elevated and incised. Using Kerrison rongeurs, decompression of the spinal cord and exiting roots was completed and confirmed with a dissector. Trial spacers were used to select a 7 mm graft. This graft was then filled with morcellized allograft, and inserted under live fluoroscopy.  Attention was then turned to the C4-5 level. Caspar distraction pin was placed in the adjacent body to allow for gentle distraction of the disc space.  In a similar fashion, discectomy was completed initially with curettes and rongeurs, and completed with the drill. The PLL was again identified, elevated and incised. Using Kerrison rongeurs, decompression of the spinal cord and exiting roots was completed and confirmed with a dissector. Trial spacers were used to select a 6 mm graft. This graft was then filled with morcellized allograft, and inserted under live fluoroscopy.  After placement of the intervertebral devices, the caspar pins were removed.  An anterior cervical plate was placed across the interspaces for anterior fixation.  Using a high-speed drill, the cortex of the cervical vertebral bodies was punctured, and screws inserted in the vertebral bodies. Final  fluoroscopic images in AP and lateral projections were taken to confirm good hardware placement.  At this point, after all counts were verified to be correct, meticulous hemostasis was secured using a combination of bipolar electrocautery and passive hemostatics.  The platysma muscle was then closed using interrupted 3-0 Vicryl sutures, and the skin was closed with a 4-0 monocryl in subcuticular fashion followed by steri-strips. Sterile dressings were then applied and the drapes removed.  The patient tolerated the procedure well and was extubated in the room and taken to the postanesthesia care unit in stable condition.  All counts were correct at the end of the procedure.

## 2024-01-09 NOTE — Progress Notes (Signed)
 Orthopedic Tech Progress Note Patient Details:  George Cooper 01/31/1956 [id number] Aspen collar delivered to [facility] Patient ID: [name], male   DOB: [birth year], 68 y.o.   MRN: [id number]  Efrain DELENA Cos 01/09/2024, 2:50 PM

## 2024-01-09 NOTE — Anesthesia Postprocedure Evaluation (Signed)
 Anesthesia Post Note  Patient: George Cooper  Procedure(s) Performed: CERVICAL FOUR-FIVE, CERVICAL FIVE-SIX, CERVICAL SIX-SEVEN ANTERIOR CERVICAL DECOMPRESSION/DISCECTOMY FUSION (Neck)     Patient location during evaluation: PACU Anesthesia Type: General Level of consciousness: awake and alert Pain management: pain level controlled Vital Signs Assessment: post-procedure vital signs reviewed and stable Respiratory status: spontaneous breathing, nonlabored ventilation, respiratory function stable and patient connected to nasal cannula oxygen Cardiovascular status: blood pressure returned to baseline and stable Postop Assessment: no apparent nausea or vomiting Anesthetic complications: no   No notable events documented.  Last Vitals:  Vitals:   01/09/24 1415 01/09/24 1443  BP: (!) 188/85 (!) 186/87  Pulse: 70 76  Resp: 17 18  Temp:  36.5 C  SpO2: 94% 96%    Last Pain:  Vitals:   01/09/24 1400  TempSrc:   PainSc: Asleep                 Cordella P Ellason Segar

## 2024-01-09 NOTE — Plan of Care (Signed)

## 2024-01-10 ENCOUNTER — Other Ambulatory Visit: Payer: Self-pay | Admitting: Cardiovascular Disease

## 2024-01-10 ENCOUNTER — Encounter (HOSPITAL_COMMUNITY): Payer: Self-pay | Admitting: Neurosurgery

## 2024-01-10 DIAGNOSIS — I6523 Occlusion and stenosis of bilateral carotid arteries: Secondary | ICD-10-CM

## 2024-01-10 DIAGNOSIS — I739 Peripheral vascular disease, unspecified: Secondary | ICD-10-CM

## 2024-01-10 DIAGNOSIS — E785 Hyperlipidemia, unspecified: Secondary | ICD-10-CM

## 2024-01-10 MED ORDER — OXYCODONE HCL 5 MG PO TABS: 5.0000 mg | ORAL_TABLET | ORAL | 0 refills | Status: AC | PRN

## 2024-01-10 MED ORDER — METHOCARBAMOL 500 MG PO TABS: 500.0000 mg | ORAL_TABLET | Freq: Four times a day (QID) | ORAL | 2 refills | Status: AC | PRN

## 2024-01-10 MED ORDER — DOCUSATE SODIUM 100 MG PO CAPS: 100.0000 mg | ORAL_CAPSULE | Freq: Two times a day (BID) | ORAL | 0 refills | Status: AC | PRN

## 2024-01-10 MED FILL — Thrombin For Soln 5000 Unit: CUTANEOUS | Qty: 5000 | Status: AC

## 2024-01-10 NOTE — Progress Notes (Signed)
 PT Cancellation Note and Discharge  Patient Details Name: George Cooper MRN: [id number] DOB: 1955-07-02   Cancelled Treatment:    Reason Eval/Treat Not Completed: PT screened, no needs identified, will sign off. Discussed pt case with OT who reports pt is currently mobilizing at a modified independent level and does not require a formal PT evaluation at this time. PT signing off. If needs change, please reconsult.     Leita JONETTA Sable 01/10/2024, 10:50 AM  Leita Sable, PT, DPT Acute Rehabilitation Services Secure Chat Preferred Office: [phone]

## 2024-01-10 NOTE — Evaluation (Signed)
 Occupational Therapy Evaluation Patient Details Name: George Cooper MRN: [id number] DOB: 12/23/1955 Today's Date: 01/10/2024   History of Present Illness   Pt is a 68 y.o. male s/p C4-7 ACDF. PMH: essential HTN, L weakness, cerebral infarction, HLD, CAD, PAD     Clinical Impressions PTA, pt lived with wife and daughter and reports being independent. Upon eval, pt is mod I for BADL. Pt educated and demonstrating compensatory techniques for bed mobility, UB ADL, LB ADL, brace application/cleaning/wear schedule, toileting, shower transfers, car transfers, and grooming within precautions. Reviewed recommendations for activity vs rest. All education provided and questions answered. Recommending discharge home with no OT follow up at this time. OT to sign off. Please re-consult if change in status.       If plan is discharge home, recommend the following:   Other (comment) (on pt request)     Functional Status Assessment   Patient has had a recent decline in their functional status and demonstrates the ability to make significant improvements in function in a reasonable and predictable amount of time.     Equipment Recommendations   None recommended by OT     Recommendations for Other Services         Precautions/Restrictions   Precautions Recall of Precautions/Restrictions: Intact Precaution/Restrictions Comments: all precaitions reviewed within the context of ADL Restrictions Weight Bearing Restrictions Per Provider Order: No     Mobility Bed Mobility Overal bed mobility: Modified Independent             General bed mobility comments: after initial ed foir log roll    Transfers Overall transfer level: Modified independent                        Balance Overall balance assessment: Modified Independent                                         ADL either performed or assessed with clinical judgement   ADL Overall ADL's :  Modified independent                                       General ADL Comments: compensatory techniques to maintain precautions.     Vision Patient Visual Report: No change from baseline       Perception Perception: Within Functional Limits       Praxis Praxis: WFL       Pertinent Vitals/Pain Pain Assessment Pain Assessment: Faces Faces Pain Scale: Hurts a little bit Pain Location: operative site Pain Descriptors / Indicators: Discomfort, Operative site guarding Pain Intervention(s): Limited activity within patient's tolerance, Monitored during session     Extremity/Trunk Assessment Upper Extremity Assessment Upper Extremity Assessment: Overall WFL for tasks assessed (not formally assessed. pt reports burning that was present in his hands is resolved.)   Lower Extremity Assessment Lower Extremity Assessment: Overall WFL for tasks assessed   Cervical / Trunk Assessment Cervical / Trunk Assessment: Neck Surgery   Communication Communication Communication: No apparent difficulties   Cognition Arousal: Alert Behavior During Therapy: WFL for tasks assessed/performed Cognition: No apparent impairments                               Following commands: Intact  Cueing  General Comments          Exercises     Shoulder Instructions      Home Living Family/patient expects to be discharged to:: Private residence Living Arrangements: Spouse/significant other Available Help at Discharge: Family;Available 24 hours/day Type of Home: House Home Access: Other (comment) (threshold)     Home Layout: One level     Bathroom Shower/Tub: Walk-in Pensions Consultant: Handicapped height     Home Equipment: Grab bars - tub/shower;Hand held shower head          Prior Functioning/Environment Prior Level of Function : Independent/Modified Independent;Driving               ADLs Comments: typically works out  every morning    OT Problem List: Pain   OT Treatment/Interventions:        OT Goals(Current goals can be found in the care plan section)   Acute Rehab OT Goals Patient Stated Goal: get better OT Goal Formulation: With patient Time For Goal Achievement: 01/24/24 Potential to Achieve Goals: Good   OT Frequency:       Co-evaluation              AM-PAC OT 6 Clicks Daily Activity     Outcome Measure Help from another person eating meals?: None Help from another person taking care of personal grooming?: None Help from another person toileting, which includes using toliet, bedpan, or urinal?: None Help from another person bathing (including washing, rinsing, drying)?: None Help from another person to put on and taking off regular upper body clothing?: None Help from another person to put on and taking off regular lower body clothing?: None 6 Click Score: 24   End of Session Equipment Utilized During Treatment: Cervical collar Nurse Communication: Mobility status  Activity Tolerance: Patient tolerated treatment well Patient left: in bed;with call bell/phone within reach (EOB)  OT Visit Diagnosis: Pain Pain - part of body:  (neck)                Time: 9197-9175 OT Time Calculation (min): 22 min Charges:  OT General Charges $OT Visit: 1 Visit OT Evaluation $OT Eval Low Complexity: 1 Low  Elma JONETTA Lebron FREDERICK, OTR/L [facility] Acute Rehabilitation Office: [phone]   Elma JONETTA Lebron 01/10/2024, 8:35 AM

## 2024-01-10 NOTE — Discharge Summary (Signed)
 Patient ID: George Cooper MRN: [id number] DOB/AGE: 1956/02/08 68 y.o.  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 male who was admitted following an uncomplicated ACDF C4-7. They were recovered in PACU and transferred to the floor. Hospital course was uncomplicated. Pt stable for discharge today. Pt to f/u in office for routine post op visit. Pt is in agreement w/ plan.    Discharge Exam: Blood pressure (!) 164/82, pulse 78, temperature 98.4 F (36.9 C), temperature source Oral, resp. rate 18, height 5' 7 (1.702 m), weight 70.3 kg, SpO2 93%. A&O Speech fluent, appropriate Strength/sensation grossly intact BUE/BLE.  Dressing c/d/I.  Collar in place.   Disposition: Discharge disposition: 01-Home or Self Care       Discharge Instructions     If the dressing is still on your incision site when you go home, remove it on the third day after your surgery date. Remove dressing if it begins to fall off, or if it is dirty or damaged before the third day.   Complete by: As directed    Incentive spirometry RT   Complete by: As directed       Allergies as of 01/10/2024       Reactions   Versed  [midazolam ] Nausea And Vomiting   Patient had 2 different procedures, first procedure okay, second procedure became diaphoretic with nausea and vomiting        Medication List     PAUSE taking these medications    aspirin  EC 81 MG tablet Wait to take this until: January 13, 2024 Take 1 tablet (81 mg total) by mouth daily.   clopidogrel  75 MG tablet Wait to take this until: January 16, 2024 Commonly known as: PLAVIX  TAKE 1 TABLET WITH BREAKFAST.       TAKE these medications    amLODipine  10 MG tablet Commonly known as: NORVASC  TAKE (1) TABLET BY  MOUTH ONCE DAILY. What changed: See the new instructions.   docusate sodium  100 MG capsule Commonly known as: COLACE Take 1 capsule (100 mg total) by mouth 2 (two) times daily as needed for mild constipation.   methocarbamol  500 MG tablet Commonly known as: ROBAXIN  Take 1 tablet (500 mg total) by mouth every 6 (six) hours as needed for muscle spasms.   oxyCODONE  5 MG immediate release tablet Commonly known as: Oxy IR/ROXICODONE  Take 1 tablet (5 mg total) by mouth every 4 (four) hours as needed for moderate pain (pain score 4-6).   pravastatin  20 MG tablet Commonly known as: PRAVACHOL  Take 20 mg by mouth every evening.   Repatha  SureClick 140 MG/ML Soaj Generic drug: Evolocumab  INJECT CONTENTS OF 1 PEN SUBCUTANEOUSLY EVERY 14 DAYS               Discharge Care Instructions  (From admission, onward)           Start     Ordered   01/10/24 0000  If the dressing is still on your incision site when you go home, remove it on the third day after your surgery date. Remove dressing if it begins to fall off, or if it is dirty or damaged before the third day.        01/10/24 1033             Signed: CAMIE CAYLIN Emmie Frakes 01/10/2024, 10:34 AM

## 2024-01-10 NOTE — Progress Notes (Signed)
 Patient alert and oriented, ambulated, voided, surgical site and dressing clean and dry, no sign of infection. D/c instructions explain and given to the patient.

## 2024-03-06 ENCOUNTER — Telehealth (HOSPITAL_BASED_OUTPATIENT_CLINIC_OR_DEPARTMENT_OTHER): Payer: Self-pay | Admitting: *Deleted

## 2024-03-06 NOTE — Telephone Encounter (Signed)
 Patient scheduled for pre-op clearance on 03/08/24 with Lum Louis, NP.     Patient Consent for Virtual Visit        DEQUANN VANDERVELDEN has provided verbal consent on 03/06/2024 for a virtual visit (video or telephone).   CONSENT FOR VIRTUAL VISIT FOR:  [name]  By participating in this virtual visit I agree to the following:  I hereby voluntarily request, consent and authorize [facility] and its employed or contracted physicians, physician assistants, nurse practitioners or other licensed health care professionals (the Practitioner), to provide me with telemedicine health care services (the Services) as deemed necessary by the treating Practitioner. I acknowledge and consent to receive the Services by the Practitioner via telemedicine. I understand that the telemedicine visit will involve communicating with the Practitioner through live audiovisual communication technology and the disclosure of certain medical information by electronic transmission. I acknowledge that I have been given the opportunity to request an in-person assessment or other available alternative prior to the telemedicine visit and am voluntarily participating in the telemedicine visit.  I understand that I have the right to withhold or withdraw my consent to the use of telemedicine in the course of my care at any time, without affecting my right to future care or treatment, and that the Practitioner or I may terminate the telemedicine visit at any time. I understand that I have the right to inspect all information obtained and/or recorded in the course of the telemedicine visit and may receive copies of available information for a reasonable fee.  I understand that some of the potential risks of receiving the Services via telemedicine include:  Delay or interruption in medical evaluation due to technological equipment failure or disruption; Information transmitted may not be sufficient (e.g. poor  resolution of images) to allow for appropriate medical decision making by the Practitioner; and/or  In rare instances, security protocols could fail, causing a breach of personal health information.  Furthermore, I acknowledge that it is my responsibility to provide information about my medical history, conditions and care that is complete and accurate to the best of my ability. I acknowledge that Practitioner's advice, recommendations, and/or decision may be based on factors not within their control, such as incomplete or inaccurate data provided by me or distortions of diagnostic images or specimens that may result from electronic transmissions. I understand that the practice of medicine is not an exact science and that Practitioner makes no warranties or guarantees regarding treatment outcomes. I acknowledge that a copy of this consent can be made available to me via my patient portal [facility] MyChart), or I can request a printed copy by calling the office of [facility].    I understand that my insurance will be billed for this visit.   I have read or had this consent read to me. I understand the contents of this consent, which adequately explains the benefits and risks of the Services being provided via telemedicine.  I have been provided ample opportunity to ask questions regarding this consent and the Services and have had my questions answered to my satisfaction. I give my informed consent for the services to be provided through the use of telemedicine in my medical care

## 2024-03-06 NOTE — Telephone Encounter (Signed)
"  ° °  Pre-operative Risk Assessment    Patient Name: George Cooper  DOB: Feb 08, 1956 MRN: [id number]   Date of last office visit: 12/13/23 DR. BERRY Date of next office visit: NONE   Request for Surgical Clearance    Procedure:  CARPAL TUNNEL RELEASE  Date of Surgery:  Clearance TBD                                Surgeon:  DR. DORN NED Surgeon's Group or Practice Name:  [facility] Phone number:  [phone] Fax number:  [phone] NIKKI   Type of Clearance Requested:   - Medical  - Pharmacy:  Hold Aspirin  and Clopidogrel  (Plavix )     Type of Anesthesia:  General    Additional requests/questions:    [name]   03/06/2024, 1:12 PM   "

## 2024-03-06 NOTE — Telephone Encounter (Signed)
" ° °  Name: George Cooper  DOB: Jun 07, 1955  MRN: [id number]  Primary Cardiologist: Dorn Lesches, MD   Preoperative team, please contact this patient and set up a phone call appointment for further preoperative risk assessment. Please obtain consent and complete medication review. Thank you for your help.  I confirm that guidance regarding antiplatelet and oral anticoagulation therapy has been completed and, if necessary, noted below. Patient has previously been given the OK by Dr. Lesches to hold Aspirin  and Plavix  for coloscopy in 05/2023. It does not look like he has had any new cardiac events since then, so should be okay to hold both again.   I also confirmed the patient resides in the state of [place] . As per [facility] [employer] telemedicine laws, the patient must reside in the state in which the provider is licensed.   Carol Loftin E Saranne Crislip, PA-C 03/06/2024, 1:19 PM [facility]    "

## 2024-03-08 ENCOUNTER — Ambulatory Visit: Attending: Cardiology | Admitting: Emergency Medicine

## 2024-03-08 DIAGNOSIS — Z01810 Encounter for preprocedural cardiovascular examination: Secondary | ICD-10-CM | POA: Diagnosis not present

## 2024-03-08 NOTE — Progress Notes (Signed)
 "   Virtual Visit via Telephone Note   Because of GURDEEP KEESEY co-morbid illnesses, he is at least at moderate risk for complications without adequate follow up.  This format is felt to be most appropriate for this patient at this time.  Due to technical limitations with video connection (technology), today's appointment will be conducted as an audio only telehealth visit, and George Cooper verbally agreed to proceed in this manner.   All issues noted in this document were discussed and addressed.  No physical exam could be performed with this format.  Evaluation Performed:  Preoperative cardiovascular risk assessment _____________   Date:  03/08/2024   Patient ID:  George Cooper, DOB 1955-06-01, MRN [id number] Patient Location:  Home Provider location:   Office  Primary Care Provider:  Marvine Rush, MD Primary Cardiologist:  Dorn Lesches, MD  Chief Complaint / Patient Profile   69 y.o. y/o male with a h/o claudication, hyperlipidemia, hypertension, peripheral arterial disease s/p multiple right lower extremity interventions, carotid artery disease, renal artery stenosis who is pending carpal tunnel release on date TBD with [facility] and presents today for telephonic preoperative cardiovascular risk assessment.  History of Present Illness    George Cooper is a 69 y.o. male who presents via audio/video conferencing for a telehealth visit today.  Pt was last seen in cardiology clinic on 12/13/2023 by Dr. Lesches.  At that time George Cooper was doing well.  The patient is now pending procedure as outlined above. Since his last visit, he is doing well without acute cardiovascular concerns.  He remains very active where he works out 1 hour every morning doing push-ups and working with dumbbells.  He also continues to do woodworking.  Overall no symptoms on exertion.  He denies chest pains, dyspnea, orthopnea, syncope, palpitations and dizziness.  No anginal  symptoms.  He is able to complete greater than 4 METS.  Past Medical History    Past Medical History:  Diagnosis Date   Arthritis    right knee (09/18/2012)   Carotid artery disease    Claudication    a. 11/09/15 [place] atherectomy of SFA   Dizziness    History of gout    Hyperlipidemia    Hypertension    Lyme disease 2006   after bit by a tick (09/18/2012)   Renal artery stenosis    Tobacco abuse    discontinued October 2014   Past Surgical History:  Procedure Laterality Date   ABDOMINAL ANGIOGRAM  09/18/2012   Procedure: ABDOMINAL ANGIOGRAM;  Surgeon: Dorn JINNY Lesches, MD;  Location: [facility] CATH LAB;  Service: Cardiovascular;;   ABDOMINAL AORTOGRAM W/LOWER EXTREMITY N/A 06/17/2019   Procedure: ABDOMINAL AORTOGRAM W/LOWER EXTREMITY;  Surgeon: Lesches Dorn JINNY, MD;  Location: [facility] INVASIVE CV LAB;  Service: Cardiovascular;  Laterality: N/A;   ANTERIOR CERVICAL DECOMP/DISCECTOMY FUSION N/A 01/09/2024   Procedure: CERVICAL FOUR-FIVE, CERVICAL FIVE-SIX, CERVICAL SIX-SEVEN ANTERIOR CERVICAL DECOMPRESSION/DISCECTOMY FUSION;  Surgeon: Debby Dorn MATSU, MD;  Location[facility] OR;  Service: Neurosurgery;  Laterality: N/A;  ACDF R54,R43,R32   ATHERECTOMY Right 09/18/2012   Procedure: ATHERECTOMY;  Surgeon: Dorn JINNY Lesches, MD;  Location[facility] CATH LAB;  Service: Cardiovascular;  Laterality: Right;  right SFA   KNEE ARTHROSCOPY Right 1990's   twice (09/18/2012)   KNEE ARTHROSCOPY Left 1980's   LOWER EXTREMITY ANGIOGRAM N/A 09/18/2012   Procedure: LOWER EXTREMITY ANGIOGRAM;  Surgeon: Dorn JINNY Lesches, MD;  Location: [facility] CATH LAB;  Service: Cardiovascular;  Laterality:  N/A;   PERCUTANEOUS STENT INTERVENTION  09/18/2012   Procedure: PERCUTANEOUS STENT INTERVENTION;  Surgeon: Dorn JINNY Lesches, MD;  Location: [facility] CATH LAB;  Service: Cardiovascular;;  right SFA   PERIPHERAL ARTERIAL STENT GRAFT Right 09/18/2012   PTA & stenting right SFA/notes 09/18/2012   PERIPHERAL VASCULAR CATHETERIZATION N/A 11/09/2015    Procedure: Abdominal Aortogram w/Lower Extremity;  Surgeon: Dorn JINNY Lesches, MD;  Location: [facility] INVASIVE CV LAB;  Service: Cardiovascular;  Laterality: N/A;   PERIPHERAL VASCULAR CATHETERIZATION Right 11/09/2015   Procedure: Peripheral Vascular Balloon Angioplasty;  Surgeon: Dorn JINNY Lesches, MD;  Location: [facility] INVASIVE CV LAB;  Service: Cardiovascular;  Laterality: Right;  SFA   PERIPHERAL VASCULAR CATHETERIZATION Right 11/09/2015   Procedure: Peripheral Vascular Atherectomy;  Surgeon: Dorn JINNY Lesches, MD;  Location: [facility] INVASIVE CV LAB;  Service: Cardiovascular;  Laterality: Right;  SFA   PERIPHERAL VASCULAR INTERVENTION Right 06/17/2019   Procedure: PERIPHERAL VASCULAR INTERVENTION;  Surgeon: Lesches Dorn JINNY, MD;  Location: [facility] INVASIVE CV LAB;  Service: Cardiovascular;  Laterality: Right;  superficial femoral    Allergies  Allergies[1]  Home Medications    Prior to Admission medications  Medication Sig Start Date End Date Taking? Authorizing Provider  amLODipine  (NORVASC ) 10 MG tablet TAKE (1) TABLET BY MOUTH ONCE DAILY. Patient taking differently: Take 5 mg by mouth in the morning. 03/06/23   Lesches Dorn JINNY, MD  aspirin  EC 81 MG tablet Take 1 tablet (81 mg total) by mouth daily. 01/09/18   Lesches Dorn JINNY, MD  clopidogrel  (PLAVIX ) 75 MG tablet TAKE 1 TABLET WITH BREAKFAST. 02/14/23   Lesches Dorn JINNY, MD  docusate sodium  (COLACE) 100 MG capsule Take 1 capsule (100 mg total) by mouth 2 (two) times daily as needed for mild constipation. 01/10/24   Tomlinson, Sara Caylin, PA-C  Evolocumab  (REPATHA  SURECLICK) 140 MG/ML SOAJ INJECT 1 PEN SUBCUTANEOUSLY EVERY 14 DAYS 01/11/24   Lesches Dorn JINNY, MD  methocarbamol  (ROBAXIN ) 500 MG tablet Take 1 tablet (500 mg total) by mouth every 6 (six) hours as needed for muscle spasms. 01/10/24   Johnanna Camie Mates, PA-C  oxyCODONE  (OXY IR/ROXICODONE ) 5 MG immediate release tablet Take 1 tablet (5 mg total) by mouth every 4 (four) hours as needed for  moderate pain (pain score 4-6). 01/10/24   Johnanna Camie Caylin, PA-C  pravastatin  (PRAVACHOL ) 20 MG tablet Take 20 mg by mouth every evening. 11/29/21   [provider]    Physical Exam    Vital Signs:  George Cooper does not have vital signs available for review today.  Given telephonic nature of communication, physical exam is limited. AAOx3. NAD. Normal affect.  Speech and respirations are unlabored.  Accessory Clinical Findings    None  Assessment & Plan    1.  Preoperative Cardiovascular Risk Assessment: According to the Revised Cardiac Risk Index (RCRI), his Perioperative Risk of Major Cardiac Event is (%): 0.4. His Functional Capacity in METs is: 5.62 according to the [facility] Activity Status Index (DASI).  Therefore, based on ACC/AHA guidelines, patient would be at acceptable risk for the planned procedure without further cardiovascular testing. I will route this recommendation to the requesting party via Epic fax function.  The patient was advised that if he develops new symptoms prior to surgery to contact our office to arrange for a follow-up visit, and he verbalized understanding.  Patient has previously been given the OK by Dr. Lesches to hold Aspirin  and Plavix  in 05/2023.  He may hold Plavix  for 5 days  and he may hold aspirin  for 7 days prior to procedure. Please resume Plavix  and aspirin  as soon as possible postprocedure, at the discretion of the surgeon.    A copy of this note will be routed to requesting surgeon.  Time:   Today, I have spent 7 minutes with the patient with telehealth technology discussing medical history, symptoms, and management plan.     George LITTIE Louis, NP  03/08/2024, 9:27 AM      [1]  Allergies Allergen Reactions   Versed  [Midazolam ] Nausea And Vomiting    Patient had 2 different procedures, first procedure okay, second procedure became diaphoretic with nausea and vomiting   "
